# Patient Record
Sex: Female | Born: 1965
Health system: Southern US, Community
[De-identification: ages and names within clinical notes are randomized; demographics above are authoritative.]

## PROBLEM LIST (undated history)

## (undated) DIAGNOSIS — E785 Hyperlipidemia, unspecified: Secondary | ICD-10-CM

## (undated) DIAGNOSIS — K8020 Calculus of gallbladder without cholecystitis without obstruction: Secondary | ICD-10-CM

## (undated) DIAGNOSIS — I671 Cerebral aneurysm, nonruptured: Secondary | ICD-10-CM

## (undated) DIAGNOSIS — E669 Obesity, unspecified: Secondary | ICD-10-CM

## (undated) DIAGNOSIS — R55 Syncope and collapse: Secondary | ICD-10-CM

## (undated) DIAGNOSIS — R0602 Shortness of breath: Secondary | ICD-10-CM

## (undated) DIAGNOSIS — Z8679 Personal history of other diseases of the circulatory system: Secondary | ICD-10-CM

## (undated) DIAGNOSIS — F32A Depression, unspecified: Secondary | ICD-10-CM

## (undated) DIAGNOSIS — Z9289 Personal history of other medical treatment: Secondary | ICD-10-CM

## (undated) DIAGNOSIS — I252 Old myocardial infarction: Secondary | ICD-10-CM

## (undated) DIAGNOSIS — E7800 Pure hypercholesterolemia, unspecified: Secondary | ICD-10-CM

## (undated) DIAGNOSIS — R079 Chest pain, unspecified: Secondary | ICD-10-CM

## (undated) DIAGNOSIS — I2510 Atherosclerotic heart disease of native coronary artery without angina pectoris: Secondary | ICD-10-CM

## (undated) DIAGNOSIS — M549 Dorsalgia, unspecified: Secondary | ICD-10-CM

## (undated) DIAGNOSIS — Z87891 Personal history of nicotine dependence: Secondary | ICD-10-CM

## (undated) DIAGNOSIS — R011 Cardiac murmur, unspecified: Secondary | ICD-10-CM

## (undated) DIAGNOSIS — F419 Anxiety disorder, unspecified: Secondary | ICD-10-CM

## (undated) DIAGNOSIS — G500 Trigeminal neuralgia: Secondary | ICD-10-CM

## (undated) DIAGNOSIS — F329 Major depressive disorder, single episode, unspecified: Secondary | ICD-10-CM

## (undated) DIAGNOSIS — K219 Gastro-esophageal reflux disease without esophagitis: Secondary | ICD-10-CM

## (undated) HISTORY — DX: Shortness of breath: R06.02

## (undated) HISTORY — DX: Cardiac murmur, unspecified: R01.1

## (undated) HISTORY — PX: COLONOSCOPY: SHX174

## (undated) HISTORY — DX: Old myocardial infarction: I25.2

## (undated) HISTORY — DX: Personal history of other medical treatment: Z92.89

## (undated) HISTORY — DX: Dorsalgia, unspecified: M54.9

## (undated) HISTORY — DX: Obesity, unspecified: E66.9

## (undated) HISTORY — DX: Pure hypercholesterolemia, unspecified: E78.00

## (undated) HISTORY — DX: Personal history of nicotine dependence: Z87.891

## (undated) HISTORY — DX: Trigeminal neuralgia: G50.0

## (undated) HISTORY — DX: Hyperlipidemia, unspecified: E78.5

## (undated) HISTORY — PX: TUBAL LIGATION: SHX77

## (undated) HISTORY — DX: Chest pain, unspecified: R07.9

## (undated) HISTORY — DX: Atherosclerotic heart disease of native coronary artery without angina pectoris: I25.10

## (undated) HISTORY — PX: LASIK: SHX215

## (undated) HISTORY — DX: Cerebral aneurysm, nonruptured: I67.1

---

## 2000-02-02 ENCOUNTER — Emergency Department (HOSPITAL_COMMUNITY): Admission: EM | Admit: 2000-02-02 | Discharge: 2000-02-02 | Payer: Self-pay | Admitting: Internal Medicine

## 2000-04-03 HISTORY — PX: ABDOMINAL HYSTERECTOMY: SHX81

## 2000-11-15 ENCOUNTER — Other Ambulatory Visit: Admission: RE | Admit: 2000-11-15 | Discharge: 2000-11-15 | Payer: Self-pay | Admitting: Gynecology

## 2000-11-23 ENCOUNTER — Encounter: Payer: Self-pay | Admitting: Family Medicine

## 2000-11-23 ENCOUNTER — Ambulatory Visit (HOSPITAL_COMMUNITY): Admission: RE | Admit: 2000-11-23 | Discharge: 2000-11-23 | Payer: Self-pay | Admitting: Family Medicine

## 2001-02-15 ENCOUNTER — Ambulatory Visit (HOSPITAL_COMMUNITY): Admission: RE | Admit: 2001-02-15 | Discharge: 2001-02-15 | Payer: Self-pay | Admitting: Gynecology

## 2001-03-14 ENCOUNTER — Inpatient Hospital Stay (HOSPITAL_COMMUNITY): Admission: RE | Admit: 2001-03-14 | Discharge: 2001-03-16 | Payer: Self-pay | Admitting: Gynecology

## 2001-03-14 ENCOUNTER — Encounter (INDEPENDENT_AMBULATORY_CARE_PROVIDER_SITE_OTHER): Payer: Self-pay | Admitting: Specialist

## 2001-11-14 ENCOUNTER — Emergency Department (HOSPITAL_COMMUNITY): Admission: EM | Admit: 2001-11-14 | Discharge: 2001-11-14 | Payer: Self-pay | Admitting: Emergency Medicine

## 2001-11-14 ENCOUNTER — Encounter: Payer: Self-pay | Admitting: Emergency Medicine

## 2002-01-07 ENCOUNTER — Other Ambulatory Visit: Admission: RE | Admit: 2002-01-07 | Discharge: 2002-01-07 | Payer: Self-pay | Admitting: Gynecology

## 2002-07-23 ENCOUNTER — Encounter: Admission: RE | Admit: 2002-07-23 | Discharge: 2002-07-23 | Payer: Self-pay | Admitting: Gynecology

## 2002-07-23 ENCOUNTER — Encounter: Payer: Self-pay | Admitting: Gynecology

## 2003-03-25 ENCOUNTER — Other Ambulatory Visit: Admission: RE | Admit: 2003-03-25 | Discharge: 2003-03-25 | Payer: Self-pay | Admitting: Gynecology

## 2003-08-11 ENCOUNTER — Encounter: Admission: RE | Admit: 2003-08-11 | Discharge: 2003-08-11 | Payer: Self-pay | Admitting: Gynecology

## 2004-04-03 DIAGNOSIS — I252 Old myocardial infarction: Secondary | ICD-10-CM

## 2004-04-03 HISTORY — DX: Old myocardial infarction: I25.2

## 2004-04-03 HISTORY — PX: CARDIAC SURGERY: SHX584

## 2004-05-17 ENCOUNTER — Inpatient Hospital Stay (HOSPITAL_COMMUNITY): Admission: AD | Admit: 2004-05-17 | Discharge: 2004-05-24 | Payer: Self-pay | Admitting: Emergency Medicine

## 2004-05-18 HISTORY — PX: CARDIAC CATHETERIZATION: SHX172

## 2004-05-21 ENCOUNTER — Encounter: Payer: Self-pay | Admitting: Cardiovascular Disease

## 2004-06-13 ENCOUNTER — Other Ambulatory Visit: Admission: RE | Admit: 2004-06-13 | Discharge: 2004-06-13 | Payer: Self-pay | Admitting: Gynecology

## 2004-06-16 ENCOUNTER — Ambulatory Visit: Payer: Self-pay | Admitting: Cardiology

## 2004-08-17 ENCOUNTER — Ambulatory Visit: Payer: Self-pay | Admitting: Cardiology

## 2004-09-08 HISTORY — PX: CARDIAC CATHETERIZATION: SHX172

## 2004-09-26 ENCOUNTER — Encounter (HOSPITAL_COMMUNITY): Admission: RE | Admit: 2004-09-26 | Discharge: 2004-12-19 | Payer: Self-pay | Admitting: Cardiovascular Disease

## 2004-11-18 ENCOUNTER — Ambulatory Visit: Payer: Self-pay | Admitting: Internal Medicine

## 2004-11-24 ENCOUNTER — Encounter: Admission: RE | Admit: 2004-11-24 | Discharge: 2004-11-24 | Payer: Self-pay | Admitting: Gynecology

## 2005-02-16 ENCOUNTER — Ambulatory Visit: Payer: Self-pay | Admitting: Internal Medicine

## 2005-03-21 ENCOUNTER — Ambulatory Visit (HOSPITAL_COMMUNITY): Admission: RE | Admit: 2005-03-21 | Discharge: 2005-03-22 | Payer: Self-pay | Admitting: Cardiovascular Disease

## 2005-03-21 HISTORY — PX: CARDIAC CATHETERIZATION: SHX172

## 2005-05-09 ENCOUNTER — Ambulatory Visit: Payer: Self-pay | Admitting: Cardiology

## 2005-05-22 ENCOUNTER — Encounter: Payer: Self-pay | Admitting: Emergency Medicine

## 2005-05-23 ENCOUNTER — Inpatient Hospital Stay (HOSPITAL_COMMUNITY): Admission: AD | Admit: 2005-05-23 | Discharge: 2005-05-24 | Payer: Self-pay | Admitting: *Deleted

## 2005-05-23 HISTORY — PX: CARDIAC CATHETERIZATION: SHX172

## 2005-08-09 ENCOUNTER — Ambulatory Visit: Payer: Self-pay | Admitting: Cardiology

## 2005-09-06 ENCOUNTER — Encounter (HOSPITAL_COMMUNITY): Admission: RE | Admit: 2005-09-06 | Discharge: 2005-12-05 | Payer: Self-pay | Admitting: Cardiovascular Disease

## 2005-09-19 ENCOUNTER — Ambulatory Visit (HOSPITAL_COMMUNITY): Admission: RE | Admit: 2005-09-19 | Discharge: 2005-09-19 | Payer: Self-pay | Admitting: Urology

## 2005-11-27 ENCOUNTER — Encounter: Admission: RE | Admit: 2005-11-27 | Discharge: 2005-11-27 | Payer: Self-pay | Admitting: Gynecology

## 2006-03-18 ENCOUNTER — Inpatient Hospital Stay (HOSPITAL_COMMUNITY): Admission: AD | Admit: 2006-03-18 | Discharge: 2006-03-19 | Payer: Self-pay | Admitting: *Deleted

## 2006-09-24 ENCOUNTER — Other Ambulatory Visit: Admission: RE | Admit: 2006-09-24 | Discharge: 2006-09-24 | Payer: Self-pay | Admitting: Gynecology

## 2006-12-18 ENCOUNTER — Encounter: Admission: RE | Admit: 2006-12-18 | Discharge: 2006-12-18 | Payer: Self-pay | Admitting: Gynecology

## 2008-01-13 ENCOUNTER — Encounter: Admission: RE | Admit: 2008-01-13 | Discharge: 2008-01-13 | Payer: Self-pay | Admitting: Gynecology

## 2008-07-15 ENCOUNTER — Ambulatory Visit: Payer: Self-pay

## 2008-08-04 ENCOUNTER — Ambulatory Visit (HOSPITAL_COMMUNITY): Admission: RE | Admit: 2008-08-04 | Discharge: 2008-08-04 | Payer: Self-pay | Admitting: Cardiovascular Disease

## 2008-08-04 ENCOUNTER — Encounter (INDEPENDENT_AMBULATORY_CARE_PROVIDER_SITE_OTHER): Payer: Self-pay | Admitting: Cardiovascular Disease

## 2008-10-30 ENCOUNTER — Emergency Department (HOSPITAL_COMMUNITY): Admission: EM | Admit: 2008-10-30 | Discharge: 2008-10-31 | Payer: Self-pay | Admitting: Emergency Medicine

## 2008-10-31 ENCOUNTER — Inpatient Hospital Stay (HOSPITAL_COMMUNITY): Admission: EM | Admit: 2008-10-31 | Discharge: 2008-11-03 | Payer: Self-pay | Admitting: Cardiovascular Disease

## 2008-11-02 HISTORY — PX: CARDIAC CATHETERIZATION: SHX172

## 2009-05-13 ENCOUNTER — Encounter: Admission: RE | Admit: 2009-05-13 | Discharge: 2009-05-13 | Payer: Self-pay | Admitting: Gynecology

## 2010-07-09 LAB — CBC
HCT: 33 % — ABNORMAL LOW (ref 36.0–46.0)
HCT: 34.6 % — ABNORMAL LOW (ref 36.0–46.0)
Hemoglobin: 11.8 g/dL — ABNORMAL LOW (ref 12.0–15.0)
MCHC: 34.1 g/dL (ref 30.0–36.0)
MCV: 92.6 fL (ref 78.0–100.0)
Platelets: 195 10*3/uL (ref 150–400)
RBC: 3.73 MIL/uL — ABNORMAL LOW (ref 3.87–5.11)
RBC: 3.86 MIL/uL — ABNORMAL LOW (ref 3.87–5.11)
RDW: 12.9 % (ref 11.5–15.5)
WBC: 3.8 10*3/uL — ABNORMAL LOW (ref 4.0–10.5)
WBC: 4.6 10*3/uL (ref 4.0–10.5)

## 2010-07-09 LAB — CARDIAC PANEL(CRET KIN+CKTOT+MB+TROPI)
CK, MB: 1 ng/mL (ref 0.3–4.0)
CK, MB: 1.4 ng/mL (ref 0.3–4.0)
Relative Index: INVALID (ref 0.0–2.5)
Relative Index: INVALID (ref 0.0–2.5)
Total CK: 62 U/L (ref 7–177)
Total CK: 66 U/L (ref 7–177)
Total CK: 70 U/L (ref 7–177)
Total CK: 72 U/L (ref 7–177)
Troponin I: 0.01 ng/mL (ref 0.00–0.06)
Troponin I: 0.02 ng/mL (ref 0.00–0.06)

## 2010-07-09 LAB — BASIC METABOLIC PANEL
BUN: 11 mg/dL (ref 6–23)
BUN: 9 mg/dL (ref 6–23)
CO2: 27 mEq/L (ref 19–32)
Calcium: 8.6 mg/dL (ref 8.4–10.5)
Creatinine, Ser: 1.01 mg/dL (ref 0.4–1.2)
GFR calc Af Amer: >60 mL/min (ref >60)
GFR calc non Af Amer: 59 mL/min — ABNORMAL LOW (ref >60)
GFR calc non Af Amer: >60 mL/min (ref >60)
Glucose, Bld: 95 mg/dL (ref 70–99)
Glucose, Bld: 97 mg/dL (ref 70–99)
Potassium: 3.8 mEq/L (ref 3.5–5.1)
Potassium: 4.2 mEq/L (ref 3.5–5.1)

## 2010-07-09 LAB — PROTIME-INR: Prothrombin Time: 14.4 seconds (ref 11.6–15.2)

## 2010-07-09 LAB — LIPID PANEL
Cholesterol: 99 mg/dL (ref 0–200)
HDL: 29 mg/dL — ABNORMAL LOW (ref >39)
LDL Cholesterol: 47 mg/dL (ref 0–99)
Triglycerides: 116 mg/dL (ref <150)

## 2010-07-10 LAB — DIFFERENTIAL
Basophils Absolute: 0 10*3/uL (ref 0.0–0.1)
Basophils Relative: 0 % (ref 0–1)
Lymphocytes Relative: 24 % (ref 12–46)
Monocytes Absolute: 0.5 10*3/uL (ref 0.1–1.0)
Monocytes Relative: 11 % (ref 3–12)
Neutro Abs: 3 10*3/uL (ref 1.7–7.7)
Neutrophils Relative %: 62 % (ref 43–77)

## 2010-07-10 LAB — POCT CARDIAC MARKERS
CKMB, poc: <1 ng/mL — ABNORMAL LOW (ref 1.0–8.0)
Myoglobin, poc: 65.8 ng/mL (ref 12–200)
Troponin i, poc: <0.05 ng/mL (ref 0.00–0.09)

## 2010-07-10 LAB — URINALYSIS, ROUTINE W REFLEX MICROSCOPIC
Bilirubin Urine: NEGATIVE
Glucose, UA: NEGATIVE mg/dL
Hgb urine dipstick: NEGATIVE
Ketones, ur: NEGATIVE mg/dL
Protein, ur: NEGATIVE mg/dL
Specific Gravity, Urine: 1.01 (ref 1.005–1.030)
pH: 7 (ref 5.0–8.0)

## 2010-07-10 LAB — BASIC METABOLIC PANEL
CO2: 26 mEq/L (ref 19–32)
Calcium: 9.1 mg/dL (ref 8.4–10.5)
Creatinine, Ser: 0.82 mg/dL (ref 0.4–1.2)
GFR calc Af Amer: >60 mL/min (ref >60)
GFR calc non Af Amer: >60 mL/min (ref >60)
Sodium: 142 mEq/L (ref 135–145)

## 2010-07-10 LAB — CARDIAC PANEL(CRET KIN+CKTOT+MB+TROPI)
CK, MB: 0.8 ng/mL (ref 0.3–4.0)
CK, MB: 1.1 ng/mL (ref 0.3–4.0)
Relative Index: 0.9 (ref 0.0–2.5)
Relative Index: INVALID (ref 0.0–2.5)
Total CK: 72 U/L (ref 7–177)
Troponin I: 0.01 ng/mL (ref 0.00–0.06)

## 2010-07-10 LAB — CBC
Hemoglobin: 12.2 g/dL (ref 12.0–15.0)
Hemoglobin: 11.4 g/dL — ABNORMAL LOW (ref 12.0–15.0)
MCHC: 35 g/dL (ref 30.0–36.0)
MCHC: 34.1 g/dL (ref 30.0–36.0)
MCV: 90.7 fL (ref 78.0–100.0)
RBC: 3.85 MIL/uL — ABNORMAL LOW (ref 3.87–5.11)
RBC: 3.68 MIL/uL — ABNORMAL LOW (ref 3.87–5.11)
RDW: 12.8 % (ref 11.5–15.5)
WBC: 4.8 10*3/uL (ref 4.0–10.5)

## 2010-07-10 LAB — CK TOTAL AND CKMB (NOT AT ARMC): Total CK: 108 U/L (ref 7–177)

## 2010-07-10 LAB — COMPREHENSIVE METABOLIC PANEL
CO2: 27 mEq/L (ref 19–32)
Calcium: 8.5 mg/dL (ref 8.4–10.5)
Creatinine, Ser: 0.77 mg/dL (ref 0.4–1.2)
GFR calc non Af Amer: >60 mL/min (ref >60)
Glucose, Bld: 95 mg/dL (ref 70–99)
Total Protein: 6.3 g/dL (ref 6.0–8.3)

## 2010-07-10 LAB — APTT: aPTT: 29 seconds (ref 24–37)

## 2010-07-10 LAB — TSH: TSH: 3.35 u[IU]/mL (ref 0.350–4.500)

## 2010-07-10 LAB — PROTIME-INR
INR: 0.9 (ref 0.00–1.49)
Prothrombin Time: 12.7 seconds (ref 11.6–15.2)

## 2010-07-10 LAB — TROPONIN I: Troponin I: 0.02 ng/mL (ref 0.00–0.06)

## 2010-08-16 NOTE — H&P (Signed)
Colleen Dillon, Colleen Dillon               ACCOUNT NO.:  [id number]   MEDICAL RECORD NO.:  [id number]          PATIENT TYPE:  INP   LOCATION:  3731                         FACILITY:  [facility]   PHYSICIAN:  Richard A. Alanda Amass, M.D.DATE OF BIRTH:  [birth year]   DATE OF ADMISSION:  10/31/2008  DATE OF DISCHARGE:                              HISTORY & PHYSICAL   CHIEF COMPLAINT:  Chest pain.   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  She is seen by Dr. Alanda Amass.  She has  a history of coronary disease.  She had an aborted BMI May 17, 2004, treated with thrombectomy and tandem bare-metal stenting.  She was  restudied in December 2006 for unstable angina and had a 90% in-stent  restenosis.  She was treated with cutting balloon and drug-eluting Taxus  stent placement.  She was studied again in February 2007, and her sites  were patent.  There is no significant circumflex OM or LAD disease.  She  has had recurrent chest pain recently and saw Dr. Alanda Amass.  She was  set up for a Myoview as an outpatient which was negative for ischemia in  May.  She was to see Dr. Alanda Amass in follow-up.  She had been having  intermittent chest pain for which she takes nitroglycerin for.  She  described a midsternal pressure.  Nitroglycerin seems to help.  The day  of admission, she was moving a couch at her daughter's house when she  had midsternal chest discomfort described as someone placing a foot on  her chest.  She became short of breath and some somewhat sweaty with  this.  She went home and rested, and her husband took her to the ER at  [facility].  Her EKG showed no acute changes, and her enzymes were  negative.  She was put on Lovenox and aspirin and transferred from [facility] for further evaluation.  She is pain free  currently.  She said she had symptoms for about two to two and half  hours.   Her past medical  history is remarkable for treated dyslipidemia.  She  has had previous hysterectomy and oophorectomy.   CURRENT MEDICATIONS:  1. Niaspan, unknown dose.  2. Plavix 75 mg a day.  3. Aspirin 325 mg a day.  4. Metoprolol 12.5 mg a day.  5. Prilosec OTC daily.  6. Zocor 20 mg a day.   She is allergic to PENICILLIN.   SOCIAL HISTORY:  She is married.  She quit smoking 25 years ago.  She  works as an Scientist, forensic at [employer].  She has two children and three  grandchildren.  One of her daughters and grandchild lives with her.   FAMILY HISTORY:  Remarkable that her mother had a history of aortic  dissection but did not require surgery.  Otherwise, there is no history  of early coronary disease.   REVIEW OF SYSTEMS:  Essentially unremarkable except for above.  She  denies any GI bleeding, melena.  There is  no history of diabetes or  hypertension.   PHYSICAL EXAMINATION:  Blood pressure 110/60, pulse 70, respirations 16.  GENERAL:  She is a well-developed, well-nourished female in no acute  distress.  HEENT:  Normocephalic, atraumatic.  Extraocular movements are intact.  Sclerae nonicteric.  Lids and conjunctivae within normal limits.  NECK:  Without JVD or bruit.  CHEST:  Clear to auscultation percussion.  CARDIAC:  Reveals regular rate and rhythm without murmur or gallop.  Normal S1-S2.  ABDOMEN:  Is nontender.  No hepatosplenomegaly.  EXTREMITIES:  Without edema.  Distal pulses are intact.  There are no  femoral bruits noted.  NEUROLOGIC EXAM:  Grossly intact.  She is awake, alert and oriented,  cooperative.  Moves all extremities without obvious deficit.   EKG shows sinus rhythm without acute changes.   LABORATORY DATA:  White count 4.8, hemoglobin 4.2, hematocrit 34.8,  platelets 233, INR 0.9.  Sodium 142, potassium 3.1, BUN 9, creatinine  0.8.  CK-MB and troponin are negative x2.  Chest x-ray shows mild  bronchitic changes.  No acute changes.   IMPRESSION:  1. Unstable  angina.  2. Known coronary disease with aborted DMI February 2006, treated with      a bare-metal stenting with early stent stenosis December 2006      treated with Taxus stenting, last catheterization February 2007      showing no significant in-stent restenosis and a Myoview in the May      2010 showing no ischemia.  Normal LV function.  3. Untreated dyslipidemia.   PLAN:  The patient is admitted to telemetry.  She has been started on  Lovenox.  Will add low-dose nitrate, continue her home medications.  She  will probably need restudy.  She will be seen by Dr. Lynnea Ferrier this  morning.      Abelino Derrick, P.A.      Richard A. Alanda Amass, M.D.  Electronically Signed    LKK/MEDQ  D:  10/31/2008  T:  10/31/2008  Job:  [id number]   cc:   Gerlene Burdock A. Alanda Amass, M.D.

## 2010-08-16 NOTE — Cardiovascular Report (Signed)
Colleen Dillon, Colleen Dillon               ACCOUNT NO.:  [id number]   MEDICAL RECORD NO.:  [id number]          PATIENT TYPE:  INP   LOCATION:  2508                         FACILITY:  [facility]   PHYSICIAN:  Richard A. Alanda Amass, M.D.DATE OF BIRTH:  03-19-1966   DATE OF PROCEDURE:  11/02/2008  DATE OF DISCHARGE:                            CARDIAC CATHETERIZATION   PROCEDURES:  Retrograde central aortic catheterization, selective  coronary angiography via Judkins technique, pre and post IC  nitroglycerin administration, LV angiogram in RAO and LAO projection,  abdominal aortic angiogram, midstream PA projection, hand injection,  IVUS interrogation right coronary artery, intravenous heparin infusion  and extra Plavix 150 mg p.o., fractional flow reserve (FFR) measurement  right coronary artery.   BRIEF HISTORY:  Colleen Dillon is a 45 year old white married mother of  two with three GC who has complex history of right coronary artery  disease.  She still works full-time at [employer].  Stopped  smoking 20 years ago, has a history of exogenous obesity, labile  hypertension, and hyperlipidemia.  She suffered an acute DMI on March 21, 2005 and that had late reperfusion with 90% occlusion of the right  coronary artery prior to the acute margin with positive enzymes and  diffuse eccentric thrombus throughout the distal RCA on emergency  catheterization, May 18, 2004, for acute coronary syndrome with ST  elevation MI, subtotally occluded.  She had extensive thrombectomy with  AngioJet and aspiration thrombectomy with dye for catheter along with  non-DES large stainless steel stenting with a 4.5/5.0 Liberte stents  through the RCA up to the acute margin.  She had PDA occlusion, which  was dilated with a 2.5 balloon.  She had initially successful procedure;  however, she developed recurrent pain, several hours later was brought  back the laboratory by Dr. Tresa Endo and had recurrent  thrombotic subtotal  occlusion of the distal RCA and PLA.  This was stented with overlapping  DES stents by Dr. Tresa Endo.  The PDA remained occluded, but there were good  collaterals from the left coronary artery system to one of the PDA  branches.  Clinically, she got over this quite nicely; however, because  of recurrent chest pain, she underwent catheterization on March 21, 2005 and had progression of disease in the acute margin of the dominant  RCA in between the previously stented areas of 80-85%.  This was treated  with cutting balloon atherectomy and 3.5/32 DES TAXUS stent postdilated  to 4 mm high pressure successfully.  She did well following this, but  had recurrent chest pain in 2007, prompting catheterization by Dr.  Jenne Campus on May 23, 2005 showing widely patent stents with good left-  to-right collaterals to the PDA.  She has had past negative Cardiolite  in 2007 and again 5/10 for ischemia, which did show scar.  She has had  intermittent chest pain as an outpatient.  She recently hospitalized on  October 31, 2008 with prolonged episode of substernal chest discomfort  while moving from the chair.  Acute myocardial infarction was ruled out  with normal EKG and no enzyme elevations  on multiple samples and she was  on Lovenox and medical therapy without recurrent chest pain.  She was  referred now for recatheterization.  Informed consent was obtained to  proceed.  She has had normal left coronary artery system in the past  with EF of approximately 50%.   The patient was brought to the Second Floor CP lab in the postabsorptive  state.  After 5 mg of Valium p.o. premedication, she was given a total 4  mg of Versed in divided doses for sedation and 25 mg of fentanyl for  back discomfort.  Right groin was prepped, draped in usual manner.  Xylocaine 1% was used for local anesthesia and a CRFA was entered with  single anterior puncture using 18 thin-wall needle and 6-French short   sidearm sheath was inserted without difficulty.  Diagnostic coronary  angiography was done with 6-French 4-cm taper Cordis preformed coronary  and pigtail catheters.  She was given 200 mcg of IC nitroglycerin in the  left coronary and 200 mcg of IC nitroglycerin in the right coronary with  repeat injections obtained.  LV angiogram was done in the RAO and LAO  projection at 25 mL, 14 mL per second and 20 mL, 12 mL per second  respectively.  Pullback pressure of the CA was performed and showed no  gradient across the aortic valve.  The abdominal angiogram was done in  the midstream PA projection by hand injection and showed dual widely  patent renal arteries bilaterally with normal flow.  Catheters were  removed.  Sidearm sheath was flushed.  Cine angiograms were reviewed  with my colleague, Dr. Tresa Endo.  We felt that there might be borderline  ISR of the multiple overlapping RCA stents and it was felt best to  proceed with IVUS interrogation of this.  The patient was given 3000  units of heparin and another 1000 later in the procedure.  ACTs were  therapeutic and a 150 mg of Plavix p.o. additional to her daily dose.  The right coronary was intubated with JR-4 6-French guiding catheter and  the lesion was crossed with a 0.014-inch Asahi soft guidewire.  [employer] iCross 40 MHz 3.6-French IVUS catheter was positioned  across the distal RCA and automated pullback was performed.  This  demonstrated nonsignificant right coronary artery stenosis (see below),  but this was borderline, so it was felt best to proceed with fractional  flow reserve measurement in this case.  The guidewire and IVUS were  removed.  The lesion was re-crossed with a 0.014-inch Volcano PrimeWire  - pressure flow wire.  This was equalized at the ostia.  The patient was  given 18 mcg of adenosine IV and then a second measurement was made  after 24 mcg of IV adenosine were administered to the right coronary   artery.  Fractional flow reserve was essentially normal with initial  measurement of 1.0 and second measurement 0.94.  The wire was then  removed.  The guiding catheter removed.  Sidearm sheath was flushed and  secured to the skin and the patient was taken to the holding area.  Since she had received Lovenox just several hours prior to the  procedure, we will probably have to wait another couple of hours before  removing her sidearm sheath and applying pressure for hemostasis to the  right groin.  The patient did tolerate the procedure well and was  transferred to the post-PCI unit for monitoring after the holding area.   Pressures:  LV:  100/0; LVEDP 16 mmHg.  CA:  100/66 mmHg.   There was no gradient between LV and CA on catheter pullback.   LV angiogram in the RAO projection revealed normal inferior wall  contraction.  There was hypo-akinesis from the proximal third down to  the apex of the inferior wall.  There was scar and some preserved wall  motion and hypo-akinesis of the apex.  In the LAO projection, there was  hypo-akinesis of the distal third of the posterior wall down to the  apex.  Estimated EF was greater than 50% with no mitral regurgitation  present.   Underlying fluoroscopy essentially showed multiple overlapping right  coronary artery stents from the midportion across the PLA branch.   The main left coronary artery was normal and large.   The LAD was of moderate size coursed to the apex of the heart.  There  was approximately 30% smooth narrowing at a bend in the junction of the  mid and distal third of the RCA, but no significant stenosis or systolic  compression with normal flow.  There was a large trifurcating DX1 before  SP1 and this was larger than the LAD and was normal.  The circumflex was  comprised with a large marginal branch and a small AV groove circumflex  branch, which were normal.   The right coronary was a large dominant vessel.  Extensive  stenting was  seen angiographically from the midportion crossed the PLA branch from  previously placed overlapping stents.   Proximal to the acute margin, there were two areas of eccentric 30% and  concentric less than 50% narrowing with excellent residual lumen and  some mild hypolucency.  There was another area beyond the acute margin  of less than 50% narrowing within the DES stent.  At the origin of the  PLA, there was approximately 50-60% narrowing angiographically, but good  flow.  There were several PDA branches; one was occluded and there was a  70% lesion of another one that is unchanged arising within the distal  stent.  There were excellent collaterals, grade 3, from the LAD to the  distal PDA which was unchanged from prior angiograms.   IVUS interrogation of the stent showed good stent expansion.  There was  excellent residual lumen throughout the stented area in the mid RCA  stent extending across the acute margin.  There was less than 50%  narrowing in the proximal portion of the DES stent beyond the acute  margin and the area of concerned at the origin of the PLA had a diameter  of 2.43 x 2.86 mm with eccentric narrowing, but good residual lumen and  a CSA of 5.6 meter squared.   As mentioned FFR across this area was normal at 0.92 and 1.0212  determinations.   DISCUSSION:  The patient has one episode of chest pain prompting her  admission.  She has had negative Cardiolite for ischemia, 5/10, and she  has had recurrent chest pain in the past on medical therapy, but  generally has been doing well.  There was no evidence of hemodynamically  significant ISR of her extensively stented RCA and she has IVUS wise and  angiographically approximately 50% or less areas of ISR in the  previously placed stents before the occluded PDA and at the PLA origin.  I would recommend continued medical therapy of this at this time.  Aggressive lipid lowering would also be indicated  long-term.  We will  see the patient  back in clinical followup.   CATHETERIZATION DIAGNOSES:  1. Chest pain, etiology not determined.  2. Inferior and posterior apical wall motion abnormality from prior      ACS with acute ST elevation myocardial infarction on May 18, 2004.  3. Non-DES overlapping stents, mid right coronary artery to acute      margin, May 18, 2004 with associated rheolytic thrombectomy      with AngioJet and aspiration thrombectomy for large thrombus burden      with occluded posterior descending artery with collaterals.  4. Recurrent ischemia with overlapping DES stents distal right      coronary artery, posterolateral artery, May 18, 2004.  5. DES stenting between two previously placed stents, March 21, 2005.  6. No restenosis, February 2007, on recatheterization.  7. Negative Cardiolite for ischemia, 5/10.  8. Recatheterization today, November 02, 2008 with IVUS interrogation and      fractional flow reserve demonstrating no significant in-stent      restenosis, angiographically or by this methods with 50% ISR at      posterolateral artery origin within the stent, 50% distal right      coronary artery, 30-50% areas in mid-to-distal right coronary      artery with good residual lumen in flow and good stent expansion      and large dominant right coronary artery.  9. Old occluded posterior descending artery midportion with      collaterals from the left coronary artery.  10.Gastroesophageal reflux disease.  11.Exogenous obesity.  12.Remote smoker.  13.Hyperlipidemia.  14.Past total abdominal hysterectomy and bilateral oophorectomy, Dr.      Nicholas Lose, 2002.      Richard A. Alanda Amass, M.D.  Electronically Signed     Richard A. Alanda Amass, M.D.  Electronically Signed    RAW/MEDQ  D:  11/02/2008  T:  11/03/2008  Job:  [id number]   cc:   [facility]  Scott A. Gerda Diss, MD  Gretta Cool, M.D.

## 2010-08-16 NOTE — Discharge Summary (Signed)
NAMEJULEY, GIOVANETTI               ACCOUNT NO.:  [id number]   MEDICAL RECORD NO.:  [id number]          PATIENT TYPE:  INP   LOCATION:  2508                         FACILITY:  [facility]   PHYSICIAN:  Richard A. Alanda Amass, M.D.DATE OF BIRTH:  06-Feb-1966   DATE OF ADMISSION:  10/31/2008  DATE OF DISCHARGE:  11/03/2008                               DISCHARGE SUMMARY   DISCHARGE DIAGNOSES:  1. Chest pain, worrisome for unstable angina, catheterization this      admission with intracoronary vascular ultrasound showing moderate      in-stent restenosis, to be treated medically.  2. Treated dyslipidemia.  3. Known coronary artery disease with aborted DMI in February 2006,      treated with bare metal stenting with early in-stent restenosis in      December 2006, treated with Taxus stenting.   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  She has a history of coronary artery disease  with an aborted DMI in February 2006, treated with a bare-metal stent  that restenosed in December 2006.  She had a Taxus stent placed at that  time.  She was studied in 2007 and her sites were patent.  She had a  negative Myoview in May 2010.  She was admitted to the emergency room  over the weekend on October 31, 2008, with chest pain, worrisome for  unstable angina.  She was transferred from [facility].  She is on  Lovenox and nitrates.  Catheterization done on November 02, 2008, by Dr.  Alanda Amass revealed moderate in-stent restenosis in the RCA stents.  Under IVUS, it appeared that she had adequate flow.  Plan is for  continued aggressive medical therapy.  If she presents with recurrent  chest pain, then we may want to consider an intervention.   LABORATORY DATA:  White count 4.6, hemoglobin 11.3, hematocrit 33, and  platelets 195.  Sodium 138, potassium 4.2, BUN 11, and creatinine 0.96.  CK-MB and troponins were negative.  Cholesterol is 99, triglycerides  116,  HDL 29, and LDL 47.  EKG shows sinus rhythm without acute changes.  Chest x-ray was done in [facility] and these results are pending,  although the report from the [facility] ER showed no acute findings.   DISCHARGE MEDICATIONS:  1. Protonix 40 mg a day.  2. Zocor 40 mg a day.  3. Niaspan 500 mg at bedtime.  4. Imdur 15 mg b.i.d.  5. Plavix 75 mg a day.  6. Wellbutrin XR 300 mg as taken at home.  7. Metoprolol 12.5 b.i.d.  8. Aspirin 162 mg a day.  9. Nitroglycerin sublingual p.r.n.   DISPOSITION:  The patient is discharged in stable condition and will  follow up with Dr. Alanda Amass as an outpatient.      Abelino Derrick, P.A.      Richard A. Alanda Amass, M.D.  Electronically Signed    LKK/MEDQ  D:  11/03/2008  T:  11/04/2008  Job:  [id number]

## 2010-08-19 NOTE — Discharge Summary (Signed)
NAMEWONDER, DONAWAY               ACCOUNT NO.:  [id number]   MEDICAL RECORD NO.:  [id number]          PATIENT TYPE:  INP   LOCATION:  4740                         FACILITY:  [facility]   PHYSICIAN:  Richard A. Alanda Amass, M.D.DATE OF BIRTH:  [birth year]   DATE OF ADMISSION:  05/18/2004  DATE OF DISCHARGE:  05/24/2004                                 DISCHARGE SUMMARY   REFERRING PHYSICIAN:  Dr. Gerda Diss   CHIEF COMPLAINT:  1.  Chest pain.  2.  History of total abdominal hysterectomy.   DISCHARGE DIAGNOSES:  1.  Non-ST myocardial infarction with ventricular tachycardia.  2.  Occluded right coronary artery.  3.  Post procedure dissection and occlusion of the right coronary artery.  4.  Dyslipidemia.  5.  Placement on the TRITON study.   PROCEDURES:  1.  Cardiac catheterization, PCI, and stenting of the right coronary artery      May 18, 2004.  2.  Post procedure right coronary artery dissection and occlusion of the      right coronary artery.  Status post PCI AngioJet aspiration.  Placement      of two more DES CYPHER stents May 18, 2004.  3.  ReoPro infusion x24 hours and heparin x72 hours.   BRIEF HISTORY:  The patient is a 45 year old white female with no prior  medical history who presented with chest pain for about 45 minutes prior to  arriving in ER.  The patient said she had been getting chest pain episodes  for the last three months no more than three to five minutes and going away  itself.  She describes the chest pain as a pressure-like sensation over the  retrosternal area associated with shortness of breath.  The onset of pain is  usually at rest.  Patient did give a history of diaphoresis with no other  significant symptoms.  She was admitted to rule out MI and further treatment  as indicated.  Patient was taking a hormonal therapy and was on no other  medications.   ALLERGIES:  PENICILLIN causes rash and swelling.   PAST SURGICAL HISTORY:  Abdominal  hysterectomy and oophorectomy three years  ago.   SOCIAL HISTORY:  Patient lives with her husband and children.  She works as  a Oncologist at [employer].  She smoked cigarettes for about  two years.  She quit 23 years ago.  Alcohol:  None.  Drugs:  None.   FAMILY HISTORY:  Father died of lung cancer.  Patient's mother has a history  of hypertension.  No specific history of coronary artery disease, diabetes,  or CVA.   REVIEW OF SYSTEMS:  Negative.   For further history and physical, please see the dictated note.   HOSPITAL COURSE:  Patient was admitted and taken to the catheterization  laboratory the day of admission by Dr. Alanda Amass.  Here he found 40%  followed by a 90% stenosis of the distal RCA.  Patient subsequently had no  other findings.  Patient then underwent PCI and stenting of the distal RCA  in a complicated procedure with two bare metal  stents.  Patient tolerated  the procedure well and returned to ICU in satisfactory condition.  She  developed recurrent chest discomfort that evening.  She had minor EKG  changes and she was taken back to the catheterization laboratory by Dr. Daphene Jaeger.  At that time she was found to have 100% occlusion at the opening of  the first stent.  The patient had extensive thrombus load in the right  coronary artery.  This was AngioJetted to remove the clot and then patient  underwent PCI and stenting with two CYPHER stents by Dr. Daphene Jaeger.  Patient  tolerated procedure well.  Her enzymes post procedure were markedly  positive.  She had some ventricular irritability, some short runs of  ventricular tachycardia but this resolved.  She was transfused for anemia.  Her lipid profile was all normal except for an HDL of 33.  Her other  laboratories were normal.  She made slow, steady progress.  Her ecchymosis  improved.  She was kept on ReoPro for 24 hours and then heparin for another  72 hours.  She was subsequently transferred to  the floor on May 22, 2004.  She was mobilized and by May 24, 2004 it was Dr. Landry Dyke opinion  that the patient could be discharged home.  She was discharged home on the  following medications.   DISCHARGE MEDICATIONS:  1.  TRITON drug for stent.  2.  Aspirin 325 mg daily.  3.  Lopressor 50 mg half tablet b.i.d.  4.  Niacin 500 mg daily.  5.  Zocor 20 mg daily.  6.  Nitroglycerin sublingual p.r.n. q. 5 minutes p.r.n. .   DISCHARGE ACTIVITY:  Light to moderate.  No lifting over 10 pounds.  No  driving.  No strenuous activity until she is cleared by Dr. Alanda Amass.   LABORATORIES:  White count 7.8, hemoglobin 10.2, hematocrit 29, platelets  314,000.  Electrolytes are normal.  BUN is 7, creatinine 0.9.   Patient made slow, steady progress.  She was transferred to floor on  May 23, 2004.  In the a.m. of May 24, 2004 it was our opinion she  was ready for discharge.  She had had some low grade fever which was  associated with a cough and cultures pending on that.  If she has any  problems we will plan to treat her as an outpatient.  She is scheduled to  come back and see Dr. Pearletha Furl. Alanda Amass in approximately two weeks.  Contact Dr. Gerda Diss for follow-up in his office.   PLAN:  The patient did well and she was subsequently discharged home in the  afternoon of May 24, 2004.   DISCHARGE MEDICATIONS:  Again and:  1.  TRITON drug.  2.  Aspirin 325 mg daily to take with her Niacin.  3.  Lopressor 50 mg half tablet b.i.d.  4.  Niacin 500 mg h.s.  5.  Zocor 20 mg daily.  6.  Nitroglycerin 1/150 SL p.r.n.  7.  Protonix 40 mg x1 month.   CONDITION ON DISCHARGE:  Improving.      WDJ/MEDQ  D:  05/24/2004  T:  05/24/2004  Job:  [id number]   cc:   Donna Bernard, M.D.  [address]  [place]  [place] [address]  Fax: [phone]

## 2010-08-19 NOTE — Cardiovascular Report (Signed)
Colleen, Dillon               ACCOUNT NO.:  [id number]   MEDICAL RECORD NO.:  [id number]          Dillon TYPE:  INP   LOCATION:  2929                         FACILITY:  [facility]   PHYSICIAN:  Richard A. Alanda Amass, M.D.DATE OF BIRTH:  1965-10-04   DATE OF PROCEDURE:  05/18/2004  DATE OF DISCHARGE:                              CARDIAC CATHETERIZATION   PROCEDURE:  Retrograde central aortic catheterization, elective coronary  angiography via Judkins technique, intracoronary nitroglycerin  administration, LV angiogram, RAO/LAO projection, subselective LIMA, RIMA,  abdominal aortic angiogram, midstream PA projection, weight-adjusted  heparin, Triton study drug administration, continued Integrilin 2b3a  inhibitor infusion, aspiration thrombectomy with Diver CE catheter distal  RCA, PTCA high-grade mid (acute margin) RCA eccentric thrombotic occlusion  subtotal, X-Sizer thrombectomy distal RCA, tandem bare metal large vessel  Liberte Scimed 5.0/20 and 4.5/16 stent deployment mid-distal RCA with high-  pressure inflation, PDA side branch PTCA.   BRIEF HISTORY:  Colleen Dillon is a 45 year old married mother of two with one  grandchild who is a remote smoker.  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  She has no prior coronary  history.  She was said to have normal cholesterol.  She has been on chronic  HRT (hormonal replacement therapy) since 2002 after a transabdominal  hysterectomy and bilateral TO for endometriosis.  Over Colleen last three weeks,  Colleen Dillon has been having intermittent episodes of shortness of breath and  substernal chest pain at rest and exertion and bilateral arm discomfort.  At  approximately 6:00 p.m. on May 17, 2004, she began having prolonged  substernal chest pain radiating to Colleen neck and jaw and arms bilaterally.  She was seen at [facility] Emergency Room.  Initial EKG showed 1 mm inferior  ST elevation and ST  depression V1 and V2 compatible with MI.  She was  treated with heparin, IV nitroglycerin, and started on Integrilin 2b3a  inhibitor.  She became pain free with this regimen in Colleen emergency room,  and EKG essentially normalized.  She was admitted [facility], and during Colleen  evening had recurrent chest discomfort that was transient, associated with  NSVT, treated with Xylocaine.  She was transferred to [facility] in Colleen a.m. of  May 18, 2004.  She was seen by me at that time on transfer to Colleen CCU.  She was pain free, and EKG did not show any acute ST changes.  She was given  four baby aspirin at [facility] and another four baby aspirin in  Colleen CCU.  Xylocaine was discontinued, and she was continued on 2b3a  inhibitor pending urgent catheterization.  Informed consent was obtained  from Colleen Dillon and her husband to proceed with catheterization.  All risks  and alternative therapies were discussed with Colleen Dillon and her husband,  including but not limited to myocardial infarction, death, vascular  complications, tamponade, possible emergency surgery.  Colleen Dillon and her  husband elected to proceed with urgent catheterization.   Colleen Dillon was brought to Colleen second floor CP lab.  Colleen right groin  was  prepped, draped in Colleen usual manner.  One percent Xylocaine was used for  local anesthesia.  Colleen Dillon was given intermittent Versed totalling 6 mg  and 2 mg of Nubain for sedation during Colleen procedure.  Because of Colleen  Dillon's exogenous obesity and relatively low blood pressure of 110, a  Doppler flow-directed smart needle was used to access Colleen RCFA.  A Teflon-  coated J-tip guide wire was used to traverse Colleen iliac system, and guide  wire exchange was used throughout Colleen procedure.  A 6 French short side-arm  sheath was inserted without difficulty.  Diagnostic coronary angiography was  done with 6 French 4 cm taper Cordis preformed coronary and pigtail  catheters using Omnipaque  dye throughout Colleen procedure.  Colleen Dillon was  continued on IV nitroglycerin and 2b3a inhibitor.  IC nitroglycerin was  given into Colleen right coronary, with repeat injections obtained.  Subselective LIMA and RIMA were done with Colleen right coronary catheter,  demonstrating widely patent brachiocephalic, RIMA, left subclavian, and LIMA  vessels.  Colleen vertebrals were normal proximally with antegrade flow.  LV  angiogram was done in Colleen RAO and LAO projection at 25 cc, 14 cc/second, and  20 cc, 12 cc/second.  Pullback pressure to Colleen CA showed no gradient across  Colleen aortic valve.  Abdominal angiogram was done by hand injection in Colleen  midstream PA projection above Colleen level of Colleen renal arteries with a pigtail  catheter demonstrating normal proximal celiac and SMA access and normal  single renal arteries bilaterally.  Colleen immediate infrarenal abdominal aorta  appeared normal, and there was good runoff.   Colleen Dillon tolerated Colleen diagnostic procedure well, without chest pain or  EKG changes.   PRESSURES:  LV:  115/0; LVEDP 20 mmHg.  CA:  115/70 mmHg.  There was no gradient across Colleen aortic valve.   LV angiogram demonstrated hypo-akinesis of Colleen mid-inferior wall,  hypokinesis of Colleen basilar inferior wall, and hypo-akinesis of Colleen  posteroapical segment.  Estimated EF was well preserved and approximately  50% or greater.  There was no significant mitral regurgitation present.   Colleen main left coronary artery was normal.   Colleen left anterior descending artery was widely patent, smooth throughout its  course.  There was some mild systolic bridging in Colleen mid-LAD, but no  significant vessel compression or narrowing.  Colleen LAD coursed Colleen apex of  Colleen heart, where it bifurcated with normal flow.   There was a very large trifurcating first diagonal branch that arose from  Colleen proximal LAD that was normal, widely patent, large and smooth.  Colleen circumflex artery was comprised of a small  trifurcating first marginal  branch, a circumflex proper that was of moderate size, and a small PABG  branch.   Colleen right coronary was a large dominant vessel.  Colleen proximal third and  midportion were widely patent, smooth, and normal, and represented a large  dominant vessel.  There was approximately 40% segmental narrowing just  beyond Colleen midportion up to Colleen moderately large RV branch before Colleen acute  margin.  There was a 90% eccentric thrombotic stenosis of Colleen RCA involving  Colleen acute margin, beginning just beyond Colleen RV branch.  Colleen RCA distal to  this demonstrated lumpy bumpy narrowing.  There was no visible thrombus in  this, but we could not exclude laminar thrombus since there was a size  mismatch between Colleen rest of Colleen RCA and this vessel.  There was good  flow  to a moderate-size PDA and a large trifurcating PLA.  There was filling  defect and thrombus in Colleen proximal portion of Colleen PLA artery that was  fairly well localized.   Colleen Dillon had previously been consented to participate in Colleen Triton study  after all risks and study were explained to Colleen Dillon and her husband by  Colleen research nurse and myself.  This involves randomization between Plavix  and alternative antiplatelet therapy in addition to conventional angioplasty  therapy.  At Colleen time of Colleen study, there was no chest pain or acute ST  segment changes.   It was elected to proceed with PCI in this setting.  Colleen Dillon was given  weight-adjusted heparin of 4,500 units.  Later in Colleen procedure, she was  given another 1,000 units.  2b3a inhibitor with Integrilin was continued.  She was given oral study drug during Colleen PCI procedure.   Colleen right coronary was intubated with Colleen JR-4 6 [place] Cordis guiding  catheter.  Initially, Colleen lesion was crossed with a 0.014 short Asahi light  guide wire.  We were able to cross Colleen distal, cross Colleen distal RCA, and Colleen  guide wire was free in Colleen distal PLA branch.    Because of extensive thrombus in this area and possible thrombus beyond Colleen  lesion, an EV3 Diver aspiration thrombectomy catheter was advanced into Colleen  distal RCA to Colleen PLA and approximately five or six aspiration pullback runs  were done in Colleen right coronary artery.  This gave very minimal improvement.  Colleen lesion itself, using exchange technique, was then dilated at low  pressure with a 3.0/12 undersize guide and Voyager balloon at 5-20 and 5-20.   Angiography post IC nitroglycerin demonstrated continued thrombus.  Colleen  Dillon began having chest pain and transient no reflow phenomenon  throughout Colleen distal RCA and PLA with ST elevation.  She was treated with  intracoronary nitroglycerin and intracoronary adenosine, a total of 18 mcg  x2, and multiple doses of intracoronary nitroglycerin, monitoring blood  pressures.   Colleen balloon was then exchanged for a [employer] X-Sizer aspiration thrombectomy catheter.  This was positioned.  In order to use this, a new  wire had to be passed across Colleen lesion into Colleen PLA 300 cm Asahi light  wire.  Colleen previous wire was removed.  Colleen X-Sizer catheter was delivered,  and aspiration thrombectomy of approximately four runs were done from Colleen  PLA around Colleen acute margin to Colleen mid-RCA.  This showed significant  improvement in Colleen lumen of Colleen distal RCA, and angiographic resolution of  thrombus in Colleen PLA, with persistence of Colleen coronary lesion.  There was  some dye staining at Colleen coronary lesion suggesting that this was probably  related to ruptured plaque.   Colleen distal RCA was extremely large, as was Colleen remainder of Colleen RCA, and  there was significant improvement in Colleen lumen up to Colleen area just distal to  Colleen lesion.   Colleen lesion was then stented with a large-vessel [employer] Liberte  bare metal 5.0/20 mm stent, which was deployed to Colleen lesion and across Colleen  lesion in Colleen RV branch at 9-30.  It was post-dilated at 10 ATM  for 10  seconds.  There was a residual stenosis, so a second overlapping Liberte  4.5/16 stent was positioned fluoroscopically and deployed at 10-30 and post-  dilated with Colleen dilatation balloon at 15-30 on Colleen overlap (equivalent to 5  mm diameter).  Colleen balloon was removed.  Colleen lesion was covered completely,  and there was good flow to Colleen distal RCA.  However, Colleen PLA was occluded,  probably thrombotic occlusion, since initial angiograms did not show a  lesion at Colleen PLA origin.   Attempts to cross Colleen PDA with Colleen Asahi soft wire were accomplished, and  Colleen ostium of Colleen PDA was dilated with a 2.5/12 Voyager balloon.  This did  not result in good flow, and there was faint flow to Colleen PDA.  We were  unable to pass a wire any further, so this was abandoned, and Colleen wire was  pulled back.   Colleen Dillon was given several doses during Colleen stenting and interventional  procedure of intracoronary nitroprusside of 200 mcg x 3, which improved  distal diameter and flow.  At Colleen end of Colleen procedure, there was good TIMI  III flow into Colleen PLA and only faint flow into Colleen PDA branch.  Colleen lesion  was well covered, with Colleen stent well approximated, with no visible  thrombus.   There was a small linear dissection of Colleen distal RCA distal to Colleen stented  area that was not flow limiting superior.  It was elected not to treat this  area.  Colleen dilatation system was removed.  Colleen Dillon was given 5 mg of  Lopressor IV.  Blood pressure during ranged from 130-140 systolic, and Colleen  Dillon maintained sinus rhythm.  She was pain free at this time.  ST  segments were minimally elevated in Colleen inferior leads.  Side-arm sheath was  flushed. Colleen final ACT was 230 seconds, and Colleen Dillon was given another  1,000 units of intravenous heparin.  Colleen side-arm sheath was secured with a  #1 silk suture to prevent migration, and Colleen Dillon was transferred to Colleen  holding area for postoperative care.  Colleen  Dillon presented initially with 1 mm ST segment elevation on May 17, 2004 that normalized in Colleen ER, with therapy as outlined above.  She had  a subtotal greater than 90% eccentric thrombotic RCA lesion at Colleen acute  margin, with what turned out to be diffuse probably laminated thrombus and  associated spasm beyond Colleen lesion extending up into Colleen PLA.  Aspiration  thrombectomy was performed, as outlined above, along with tandem bare metal  stenting of Colleen large dominant RCA across Colleen lesion.   She had side branch PDA functional occlusion at Colleen end of Colleen procedure,  which hopefully will open up with continued anticoagulant therapy.  Also,  there is a small linear dissection beyond Colleen stented area that was not flow  limiting that will hopefully remain stable on therapy.   We plan to continue study drug from Colleen Triton study along with 2b3a  inhibitor.   Based on Colleen Dillon's clinical status, laboratory values, we will decide  about restudy to further assess her large dominant right coronary artery and  see if any further therapy is needed and also to see if she has reperfusion  of Colleen PDA branch.  Her enzymes were elevated to 400 from [facility], with positive MBs and troponin.  We expect that she will have  further enzyme release because of her transient no reflow phenomenon and ST  elevation from her thrombotic occlusion.   She will be treated with appropriate ancillary medical therapy and observed  in Colleen CCU.   CATHETERIZATION DIAGNOSES:  1.  Aborted acute DMI, May 17, 2004, with therapy as outlined above.  2.  High-grade thrombotic eccentric right coronary artery stenosis with      distal right coronary artery laminated extensive thrombus, treated with      percutaneous transluminal coronary angioplasty, aspiration thrombectomy,      tandem bare metal large-vessel stents, as outlined above.  3.  Well-preserved left ventricular function.  Ejection  fraction greater      than 50%.  Inferior and posteroapical wall motion abnormality.  4.  Lipid status pending.  Normal by history.  5.  History of abdominal hysterectomy and bilateral oophorectomy for      endometriosis (Dr. Nicholas Lose), 2002, and subsequent chronic hormone      replacement therapy.  6.  Exogenous obesity.  7.  Family history of vascular disease.  Mother with hypertension and      history of aortic dissection.  8.  Remote smoker.      RAW/MEDQ  D:  05/18/2004  T:  05/18/2004  Job:  [id number]   cc:   Gerlene Burdock A. Alanda Amass, M.D.  [phone] [address], [address]  [place]  [place] [address]  Fax: [phone]   CP Lab   Scott A. Gerda Diss, MD  [address], [address]  [place]  [place] [address]  Fax: [phone]   Catheterization Lab  Second Floor   Gretta Cool, M.D.  [address] [place]  [place] [address]  Fax: [phone]

## 2010-08-19 NOTE — Cardiovascular Report (Signed)
Colleen Dillon, Colleen Dillon               ACCOUNT NO.:  [id number]   MEDICAL RECORD NO.:  [id number]          PATIENT TYPE:  OIB   LOCATION:  6522                         FACILITY:  [facility]   PHYSICIAN:  Richard A. Alanda Amass, M.D.DATE OF BIRTH:  1966-03-30   DATE OF PROCEDURE:  03/21/2005  DATE OF DISCHARGE:                              CARDIAC CATHETERIZATION   PROCEDURE:  Retrograde central aortic catheterization, selective coronary  angiography via Judkins technique, LV angiogram RAO, LAO projection,  abdominal aortic angiogram midstream PA projection hand injection, weight-  adjusted heparin, continued Triton study drug (antiplatelet) IVUS  interrogation RCA, cutting balloon atherectomy followed by DES large vessel  3.5/32 Taxus stent mid-distal RCA post dilated with 4.0 balloon high-  pressure greater than 20 atmospheres, right common femoral artery closure  with 6-French AngioSeal device successful.   HISTORY:  The patient was brought to the second floor CP lab as an  outpatient and admitting the same day with normal preoperative laboratory,  BUN and creatinine ___________ and 1.1. CBC, differentials and coagulases  normal. TSH normal.   The right groin was prepped and draped in the usual manner. 1% Xylocaine was  used for local anesthesia and the CRFA was entered with single anterior  puncture using 18 thin-wall needle and a 6-French short Daig sidearm sheath  was inserted without difficulty. Diagnostic coronary angiography was done  with 6-French 4 cm taper preformed Cordis coronary and pigtail catheters  using nonionic dye. LV angiogram was done in the RAO and LAO projection 25  cc 14 cc per second, 20 cc 12 cc per second. Pullback pressure CA was  performed. It showed no gradient across the aortic valve. Abdominal aortic  angiogram was done in the midstream PA projection by hand injection  demonstrating double renal arteries bilaterally that were widely patent and  normal  infrarenal abdominal aorta. Catheters were removed. Side-arm sheath  was flushed. The patient tolerated the diagnostic procedure well. She was  given intermittent Versed during the diagnostic and PCI procedure, a total  of 5 milligrams IV, 25 mcg of fentanyl and 4000 units of intravenous  heparin. She was continued on the Triton antiplatelet study drug (blinded  Plavix versus Triton drug) aspirin as an outpatient.   PRESSURES:  LV: 110/0; LVEDP 18 mmHg.   CA: 110/60 mmHg.   LV angiogram showed hypo-akinesis of the basilar quarter of the inferior  wall, akinesis of the small area of the mid-inferior, hypo-akinesis of the  inferoapical segment with a mild paradoxical bulge. In the LAO projection  there was hypo-akinesis of the distal septal apical and posterior apical  segment. Overall EF was greater than 55% with no mitral regurgitation.   Fluoroscopy showed the previously placed extensive large vessel mid and  distal RCA stents. There was no significant left coronary calcification or  intracardiac calcification.   Main left coronary was normal.   The LAD was widely patent and smooth throughout its course. It coursed to  the apex of the heart where it bifurcated. It then gave off grade 3  collaterals to the previously occluded PDA from the distal LAD  which is  unchanged from prior angiography. The large first diagonal bifurcated twice,  arose before SP1 and was widely patent and normal. A moderate-sized second  diagonal arose from the mid-LAD. Multiple septal perforators were present.   There was a small normal optional diagonal.   The circumflex was nondominant, bifurcated distally, gave off a small  marginal branch proximally and a small PABG branch that were normal.   The right coronary artery was a large dominant vessel.   The previously placed overlapping mid-RCA bare-metal stents showed 40-50%  and 50-60% intimal proliferation in narrowing in the distal third.   The  distal overlapping DES Cypher stents beginning in the distal RCA  extending into the PLA showed an area of approximately 60% narrowing near  the PLA bifurcation and mild to moderate in-stent restenosis with good flow.   The intercurrent area between the stented area just beyond the acute margin  had concentric 80-85% narrowing. There was also some streaming of flow into  the post stenotic area. This appeared progressed from the patient prior  catheterization.   It was elected to proceed with IVUS interrogation because of concerns of  progression of disease in the stented area of distal RCA and in-stent  restenosis.   IVUS interrogation was done with this probe through the 6-French guiding  catheter over a 0.014 inch Asahi soft wire that was used to traverse the  lesion. It was free in the distal PLA. ACT was therapeutic after weight-  adjusted heparin prior to IVUS interrogation.   IVUS therapy to the nonstented was noted to have diffuse soft plaque in a  relatively concentric fashion with a lumen of 2.0 x 2.0 mm. There was also  diffuse in-stent restenosis within the distal portion of the bare-metal  stent with intimal proliferation and moderate restenosis of the midportion  of the overlapping DES stents in the distal RCA. It was elected to proceed  with PCI in this setting.   The patient was given double bolus Aggrastat plus infusion. ACTs were  monitored. A 4.0/10 Scimed cutting balloon was then used. Inflations within  the distal tandem DES stents in the distal RCA and to the PLA were done at 4-  30, 4-30 and 5-55. The balloon was pulled back to the area between these  stents beyond the acute margin and inflations were done at 6-42, 6-44 and 8-  48.   The balloon was then pulled back. Scout injections were obtained post IC  nitroglycerin administration. The area between the two stents and  overlapping of the stented area was then restented with a DES Scimed Taxus Express II,  3.5/32 mm long stent which was positioned fluoroscopically,  deployed at 18-34, post dilated at 20-31.   Following that, the cutting balloon was reintroduced and the distal area  near one of the PLA branches was redilated at 6-46.   The balloon was exchanged for 4.0-20 upgraded Scimed Quantum balloon. The  stented area and overlap was post dilated at 20-39, 20-32 and 20-38. The  balloon was removed and final injection showed excellent angiographic result  with TIMI III flow to the PLA branches. The distal in-stent restenosis was  reduced from 60% to less than 10% with side branches intact. The nonstented  area was reduced to 0%. The in-stent restenosis in the mid-RCA was reduced  to less than 10%. Two large RV branches were intact. There was some minor  irregularity and 20% narrowing in one portion of the distal tandem stents.  Significant angiographic improvement overall. No streaming of flow.   DISCUSSION:  Veta's history dates back to September 26, 2004 when she was  transferred from [facility] with stuttering ST elevation inferior  myocardial infarction. She was taken to the laboratory and had thrombotic  occlusion of the RCA with large thrombus burden and was treated with  aspiration thrombectomy and nonDES large vessel 5.0/20 and 4.5/16  overlapping mid Liberte Scimed stents. The PDA was treated with a 2.5  balloon. She was on the Triton study at that time. Later that evening, she  developed reocclusion of her RCA and was taken back to the laboratory and  had AngioJet thrombectomy and then tandem 3.5/23 at 3.0/18 Cypher stents  placed in the distal RCA across the PDA in to the PLA. The PDA was occluded  but there were collaterals from the LAD and this was not reopened. She did  well after that and was treated medically. Recatheterization September 08, 2004 as  an outpatient because of symptoms of angina showed 40% narrowing in the area  between the stents and some mild streaming and  in-stent restenosis that was  felt to be noncritical. She was treated medically. Following that, however,  she recently had recurrent chest pain compatible with angina prompting this  catheterization. She has also had exertional dyspnea that may have been an  anginal equivalent.   She is on medical therapy with beta blockers, aspirin, Triton study drug,  Zocor, Niaspan,  Protonix and Femring every three months. Previous to her  infarction, she had been on supplemental estrogen orally. 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 She has a 47 year old and a 47 year old.  She smoked only a short time over 20 years ago.   Ms. Bolinger has had good angiographic result. At this time, she will be  continued on Triton study drug. Unfortunately, she has significant exogenous  obesity and hopefully we can reinstitute good exercise program and weight reduction, get dietary consultation while she is in the hospital. She has  extensive stenting from the mid-RCA extending into the PLA and she will need  to be on surveillance for this for her dominant RCA. There fortunately is no  significant LAD nondominant circumflex disease. LV function despite  segmental wall motion abnormalities is well preserved.   CATHETERIZATION DIAGNOSES:  1.  Arteriosclerotic heart disease: Subendocardial myocardial infarction      treated with bare-metal stent to the right coronary artery February      2006. ST elevation myocardial infarction.  2.  Abrupt reocclusion requiring repeat percutaneous coronary intervention      February 2006 with tandem drug-eluting stenting, distal right coronary      artery to posterior lateral artery. Posterior descending artery occluded      but good grade 3 collaterals from left anterior descending artery      chronically.  3.  Minor wall motion abnormalities. Ejection fraction greater than 55%.  4.  Recurrent  angina with progression of disease in the nonstented area, mid      - distal right coronary artery treated with cutting balloon atherectomy      and drug-eluting large vessel stent as outlined above.  5.  Cutting balloon atherectomy distal right coronary artery, mild in-stent      restenosis and mid right coronary artery and in-stent restenosis.  6.  Exogenous obesity.  7.  Hyperlipidemia.  8.  Gastroesophageal reflux  disease.  9.  Hormonal replacement therapy.  10. Remote smoker.      Richard A. Alanda Amass, M.D.  Electronically Signed     RAW/MEDQ  D:  03/21/2005  T:  03/22/2005  Job:  [id number]   cc:   Patient's chart   Record room   CP Lab   Scott A. Gerda Diss, MD  Fax: [phone]   [facility] office

## 2010-08-19 NOTE — Op Note (Signed)
[facility]  Patient:    [name], [name] Visit Number: [id number] MRN: [id number]          Service Type: DSU Location: DAY Attending Physician:  Katrina Stack Dictated by:   Gretta Cool, M.D. Admit Date:  02/15/2001   CC:         Lilyan Punt, M.D.   Operative Report  PREOPERATIVE DIAGNOSES: 1. Complex left adnexal mass. 2. Cyclic right lower quadrant pain. 3. Elevated CA-125.  POSTOPERATIVE DIAGNOSES: 1. Stage III endometriosis with enormous left ovarian endometrioma. 2. 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  PROCEDURE:  Diagnostic laparoscopy.  BRIEF HISTORY:  A 45 year old gravida 2, para 2, under the primary care of Dr. Lilyan Punt, with onset of pelvic pain, right lower quadrant, approximately six months prior, with finding of a left complex cystic adnexal mass, progressively more complex, suspicious of adnexal pathology of adenocarcinoma of the ovary.  Now admitted for diagnostic laparoscopy, possible left oophorectomy.  She wishes to delay definitive therapy if possible until she has finished her course of study.  Now admitted for laparoscopy, possible salpingo-oophorectomy, possible laparotomy.  DESCRIPTION OF PROCEDURE:  Under excellent general anesthesia, orotracheal, with the patient prepped and draped in Allen stirrups, a subumbilical incision was made an extended through the fascia.  The Veress needle was then introduced and then the pneumoperitoneum obtained with carbon dioxide.  A laparoscope trocar was then introduced and pelvic organs visualized with the patient in lithotomy position.  Extensive pelvic endometriosis was immediately identified with evidence of previous rupture and leakage of endometrioma with staining of the omentum, pelvic surfaces, anterior and posterior uterus, both ovaries.  There was evidence of uteroperitoneal fistula formation with leakage of  endometrial tissue and blood from the short stump of the fallopian tube ligation by cautery done elsewhere.  There was evidence of endometriosis or at least brown staining on the diaphragm surface, on the omentum, and on bowel. At this point a decision was made to delay definitive therapy to more complete and effective therapy by total abdominal hysterectomy, bilateral salpingo-oophorectomy at a later time as per her wishes.  At this point the procedure was terminated without complication, gas allowed to escape, incision closed with deep suture of 5-0 Vicryl after evacuation of all the possible pneumoperitoneum.  The patient was then returned to the recovery room in excellent condition. Dictated by:   Gretta Cool, M.D. Attending Physician:  Katrina Stack DD:  02/15/01 TD:  02/15/01 Job: [id number]

## 2010-08-19 NOTE — Discharge Summary (Signed)
[facility]  Patient:    Colleen Dillon, Colleen Dillon Visit Number: [id number] MRN: [id number]          Service Type: Attending:  Gretta Cool, M.D. Dictated by:   Jeani Sow, R.N., F.N.P. Adm. Date:  03/14/01 Disc. Date: 03/16/01   CC:         Lilyan Punt, M.D.   Discharge Summary  HISTORY OF PRESENT ILLNESS:  Ms. Feldt is a 45 year old female, gravida 2, para 2, who has had evaluation by Dr. Lilyan Punt in [place], [place] for complaints of right lower quadrant pain that has persisted for six months.  She describes it as colicky in nature, rising to a peak, then easing, and is often induced by intercourse lasting several hours afterwards with each occasion.  Ultrasound evaluation revealed a complex left adnexal mass.  She subsequently had diagnostic laparoscopy which revealed stage 3 endometriosis involving both ovaries, tubes, as well as evidence of uteroperitoneal fistula from previous tubal sterilization close to the corneal area of the uterus.  She is now admitted for definitive therapy by lysis of adhesions, total abdominal hysterectomy, bilateral salpingo-oophorectomy, and excision of peritoneal endometriosis.  ADMISSION EXAM:  CHEST:  Clear to auscultation and percussion.  HEART:  Rate and rhythm were regular without murmur, gallop, or cardiac enlargement.  ABDOMEN:  Soft and scaphoid without masses or organomegaly.  Recent laparoscopy scar was noted.  Previous cesarean section scar is also noted.  PELVIC:  External genitalia within normal limits for female.  Vagina is clean and rugous with adequate support.  She has a well-supported cervix and uterus. Left adnexal mass is suffixed to the pelvic wall and tender to manipulation. The right adnexa is more difficult to palpate due to her guarding. Rectovaginal exam confirms.  IMPRESSION: 1. Stage 3 endometriosis with bilateral ovarian endometriomas and    laparoscopic evidence of  uteroperitoneal fistula and intermittent    rupture and leakage of the endometriosis. 2. History of disk disease.  PLAN:  Total abdominal hysterectomy, bilateral salpingo-oophorectomy, excision of adhesions and endometrial implants.  Risks and benefits have been discussed with the patient and she accepts these procedures.  LABORATORY DATA:  Admission hemoglobin 12.4, hematocrit 36.2, on the first postoperative day hemoglobin 10.7, hematocrit 31.4.  HOSPITAL COURSE:  The patient underwent total abdominal hysterectomy, bilateral salpingo-oophorectomy, excision of multiple endometrial implants under general anesthesia.  The procedure was completed without any complications and the patient was returned to the recovery room in excellent condition.  Pathology report reveals acute and chronic cervicitis, benign secretory phase endometrium, unremarkable myometrium, serosal fibrous adhesions of the uterus, bilateral ovarian tubes - endometriosis, fibrous adhesions, no malignancy identified.  Separate fragment of skin, benign skin tissue, with dermal fibrosis.  Her postoperative course was without complications and she was discharged on the second postoperative day in excellent condition.  FINAL DISCHARGE INSTRUCTIONS:  No heavy lifting or straining, no vaginal entrance, increase ambulation as tolerated.   She is to call for any fever of over 100.5 or failure of daily improvement.  Diet regular.  DISCHARGE MEDICATIONS: 1. Climara patch 0.075 mg weekly. 2. Tylox 1 p.o. q.2h. p.r.n. discomfort. 3. Vioxx 25 mg daily.  She is to return to the office in one week for followup.  CONDITION ON DISCHARGE:  Excellent.  FINAL DISCHARGE DIAGNOSES: 1. Stage 3 endometriosis with bilateral ovarian endometrioma. 2. Incapacitated cyclic pelvic pain.  PROCEDURES PERFORMED:  Total abdominal hysterectomy, bilateral salpingo-oophorectomy, excision of multiple endometrial implants under  general anesthesia. Dictated by:  Jeani Sow, R.N., F.N.P. Attending:  Gretta Cool, M.D. DD:  04/15/01 TD:  04/15/01 Job: [id number]

## 2010-08-19 NOTE — Discharge Summary (Signed)
Colleen Dillon, BROCKWELL               ACCOUNT NO.:  [id number]   MEDICAL RECORD NO.:  [id number]          PATIENT TYPE:  INP   LOCATION:  3701                         FACILITY:  [facility]   PHYSICIAN:  Richard A. Alanda Amass, M.D.DATE OF BIRTH:  [birth year]   DATE OF ADMISSION:  03/18/2006  DATE OF DISCHARGE:  03/19/2006                               DISCHARGE SUMMARY   DISCHARGE DIAGNOSES:  1. Chest pain, myocardial infarction ruled out, and Cardiolite      negative for ischemia.  2. Known coronary disease with previous acute inferior myocardial      infarction February 2006 with subsequent percutaneous coronary      intervention and stenting, restudy December 2006 with placement of      a fixed stent in the RCA and restudied February 2007, with plans      for medical therapy only at that time.  3. Mild left ventricular dysfunction with an ejection fraction of 45%      at catheterization February 2007.  4. Treated dyslipidemia.   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  She has a history of coronary disease.  She had inferior myocardial infarction in February 2006 with RCA  stenting.  She apparently had abrupt stent closure and underwent a third  stent after that.  She was cathed again in December 2006 and had a  fourth stent placed in the RCA.  She was studied in February and was  treated medically.  She had a Cardiolite in June 2007 that showed  inferior scar but no ischemia.  She presented March 18, 2006 with  increasing chest pain in the situation of increased stress at home.  She  did get some relief with nitroglycerin.  She initially presented to  [facility] and was transferred to [facility].  She was admitted by Dr.  Jenne Campus.  Enzymes and troponin were negative.  EKG showed no significant  changes.  She was admitted to telemetry and started on subcutaneous  Lovenox and Imdur.  Cardiolite study was scheduled for the 17th.   The  patient had an adenosine Cardiolite which he tolerated well and was  negative for ischemia and showed normal LV function.  She has not had  recurrent chest pain.  We feel she can be discharged today and follow-up  with Dr. Alanda Amass after the first of the year.   DISCHARGE MEDICATIONS:  1. Plavix 75 mg a day.  2. Zocor 20 mg a day.  3. Prilosec OTC once or twice a day as needed.  4. Toprol XL 25 mg 1/2 tablet a day.  5. Niacin 500 mg h.s.  6. Aspirin 81 mg a day.  7. Prometrium as taken at home.   LABS:  White count 5.1, hemoglobin 11.7, hematocrit 34.2, platelets 300.  Sodium 137, potassium 3.8, BUN 10, creatinine 0.9.  CK-MB and troponins  were negative x3.  Lipid panel shows cholesterol of 130, HDL 43, LDL 71.   DISPOSITION:  The patient is discharged in stable condition and will  follow up with Dr.  Alanda Amass as an outpatient.      Colleen Dillon, P.A.      Richard A. Alanda Amass, M.D.  Electronically Signed    LKK/MEDQ  D:  03/19/2006  T:  03/19/2006  Job:  [id number]   cc:   Gerlene Burdock A. Alanda Amass, M.D.

## 2010-08-19 NOTE — H&P (Signed)
[facility]  Patient:    Colleen Dillon, Colleen Dillon [facility] Visit Number: [id number] MRN: [id number]          Service Type: Attending:  Gretta Cool, M.D. Dictated by:   Gretta Cool, M.D.                           History and Physical  PREOPERATIVE DIAGNOSIS:  Stage III endometriosis with bilateral ovarian endometriomas and incapacitating cyclic pelvic pain.  POSTOPERATIVE DIAGNOSIS:  Stage III endometriosis with bilateral ovarian endometriomas and incapacitating cyclic pelvic pain.  PROCEDURES: 1. Total abdominal hysterectomy. 2. Bilateral salpingo-oophorectomy. 3. Excision of multiple endometrial implants.  DESCRIPTION OF PROCEDURE:  Under excellent general anesthesia with orotracheal tube, the Pfannenstiel incision was made by excision of the previous scar. The incision was then extended through the fascia.  The rectus muscles were separated in the midline and the peritoneum opened.  The abdomen was explored. No abnormalities identified.  Examination of the pelvis revealed bilateral ovarian endometriomas with evidence of uteroperitoneal fistula probably from previous tubal cautery sterilization procedure.  The endometriosis was most significant on the left with virtually complete destruction of the left ovary.  There was also a right ovarian endometrioma and extensive disease involving the ovary and its surrounding structures.  At this point, the decision was made to proceed to total abdominal hysterectomy and bilateral salpingo-oophorectomy.  The round ligaments were then transected, the anterior leaf of the broad ligament opened, and the bladder pushed off of the lower segment.  Great care was taken because of her previous cesarean section.  The infundibulopelvic vessels were then isolated from the ureter, clamped, cut, sutured, and tied with 0 Vicryl.  During the dissection process, the large left ovarian endometrioma ruptured and spilled and was  evacuated and the pelvis irrigated free of the debris.  The uterine vessels were then clamped, cut, sutured, and tied with 0 Vicryl.  The cardinal and uterosacral ligaments were then likewise clamped, cut, sutured, and tied with 0 Vicryl.  The vagina was then entered and the cervix excised.  The cuff was closed with a running suture of 0 Vicryl.  The cardinal uterosacral complex culposuspension was then performed using 0 Ethibond.  At this point, the pelvic peritoneum was closed with a running suture of 2-0 Monocryl.  The packs and retractors were then removed and the pelvis irrigated to remove all debris.  The abdominal peritoneum was then closed with a running suture of 0 Monocryl.  The rectus muscles were plicated with the same suture of 0 Monocryl.  The fascia was approximated with a running suture of 0 Vicryl from the vagina to the midline. The subcutaneous tissues were approximated with interrupted sutures of 3-0 Vicryl.  The skin was closed with skin staples and Steri-Strips.  At the end of the procedure, the sponge and lap counts were correct with no complications.  The patient returned to the recovery room in excellent condition. Dictated by:   Gretta Cool, M.D. Attending:  Gretta Cool, M.D. DD:  03/14/01 TD:  03/14/01 Job: [id number]

## 2010-08-19 NOTE — Discharge Summary (Signed)
Colleen Dillon, Colleen Dillon NO.:  [id number]   MEDICAL RECORD NO.:  [id number]          PATIENT TYPE:  INP   LOCATION:  4740                         FACILITY:  [facility]   PHYSICIAN:  Nicki Guadalajara, M.D.     DATE OF BIRTH:  28-Jan-1966   DATE OF ADMISSION:  05/18/2004  DATE OF DISCHARGE:                                 DISCHARGE SUMMARY   ADDENDUM:   DISCHARGE MEDICATIONS:  1.  TRITON study drug.  2.  Aspirin 325 mg daily.  3.  Niacin 500 mg one daily h.s.  Aspirin to be taken with Niacin.  4.  Metoprolol 50 mg half tablet b.i.d.  5.  Protonix 40 mg daily x1 month.  6.  Nitroglycerin sublingual 1/150 sublingual p.r.n.  7.  Zocor 20 mg daily.   FOLLOW UP:  Follow-up with our office and with Dr. Gerda Diss.      WDJ/MEDQ  D:  05/24/2004  T:  05/24/2004  Job:  [id number]

## 2010-08-19 NOTE — H&P (Signed)
NAMERAKHI, ROMAGNOLI NO.:  [id number]   MEDICAL RECORD NO.:  [id number]          PATIENT TYPE:  EMS   LOCATION:  ED                           FACILITY:  [facility]   PHYSICIAN:  Ulyses Amor, MD DATE OF BIRTH:  [birth year]   DATE OF ADMISSION:  05/22/2005  DATE OF DISCHARGE:                                HISTORY & PHYSICAL   Colleen Dillon is a 45 year old black woman who is admitted to [facility] for further evaluation of chest pain.   The patient has a history of coronary artery disease which dates back to  February 2006. At that time she suffered an acute inferior myocardial  infarction. Emergent cardiac catheterization demonstrated complete occlusion  of the right coronary artery. This was stented. She developed post procedure  dissection and occlusion of the right coronary artery which required re-  stenting. In December 2006, the right coronary artery was again stented.   The patient presented to the emergency department  with chest pain which  began this morning. It has been present throughout the course of the day,  though it has waxed and waned in intensity. The chest pain is described as a  substernal pressure. It radiates to the left neck. It is not associated with  dyspnea, diaphoresis, or nausea. There are no exacerbating or ameliorating  factors other than improvement with nitroglycerin. It appears not to be  related to position, activity, meals, or respirations. She notes that the  chest pain is the same quality as that which heralded her acute myocardial  infarction. Her chest pain is significantly improved at this time, though  not completely resolved.   The patient has a history of dyslipidemia. There is no history of diabetes  mellitus, hypertension, or family history of early coronary artery disease.  There is no significant smoking history.   PAST MEDICAL HISTORY:  Otherwise unremarkable.   MEDICATIONS:  Aspirin, metoprolol,  niacin, Plavix, Zocor, and Prometrium.   ALLERGIES:  PENICILLIN causes a rash and swelling.   PAST SURGICAL HISTORY:  Abdominal hysterectomy and oophorectomy.   SOCIAL HISTORY:  The patient lives with her husband and children. She works  as a Oncologist. She does not drink alcohol.   FAMILY HISTORY:  Her father died of lung cancer. Her mother had  hypertension. Family history is otherwise unremarkable.   REVIEW OF SYSTEMS:  No new problems related to her head, eyes, ears, nose,  mouth, throat, lungs, gastrointestinal system, genitourinary system, or  extremities. There is no history of neurologic or psychiatric disorder.  There is no history of fever, chills, or weight loss.   PHYSICAL EXAMINATION:  VITAL SIGNS: Blood pressure 122/76, pulse 74 and  regular, respirations 18, temperature 97.5.  GENERAL: The patient is an obese, middle-age white woman in no distress. She  is alert, oriented, appropriate, and responsive.  HEENT: Normal.  NECK: Without thyromegaly or adenopathy. Carotid pulses are palpable  bilaterally without bruits.  CARDIAC: Normal S1 and S2. There is no S3, S4, murmur, rub, or click.  Cardiac rhythm is regular. No chest wall tenderness is  noted.  LUNGS: Clear.  ABDOMEN: Soft and nontender. There is no mass, hepatosplenomegaly, bruit,  distention, rebound, guarding, or rigidity. Bowel sounds are normal.  BREASTS/PELVIC/RECTAL EXAM: Not performed and not pertinent to the reason  for acute care hospitalization.  EXTREMITIES: Without edema, deviation, or deformity. Radial and dorsalis  pedis pulses are palpable bilaterally.  NEUROLOGIC: Brief screening neurologic survey is unremarkable.   The electrocardiogram demonstrated evidence of a prior inferior myocardial  infarction. There are no changes specific for acute ischemia or infarction.   The initial set of cardiac markers reveal a myoglobin of 63.1, CK-MB 1.4,  and troponin less than 0.05.  The second set  of cardiac markers revealed a  myoglobin of 50.7, CK-MB less than 1.0, and troponin less than 0.05.  Potassium was 3.6, BUN 13, creatinine 0.9. White count was 7.6 with a  hemoglobin of 12.4, and hematocrit 36.3. The remaining studies are pending  at the time of this dictation.   The chest radiograph, according to the radiologist, demonstrated no evidence  of acute cardiopulmonary disease. The remaining studies are pending at the  time of this dictation.   IMPRESSION:  1.  Unstable angina.  2.  Coronary artery disease, status post inferior myocardial infarction in      February 2006. Cardiac catheterization revealed an occluded right      coronary artery which was stented. The patient developed post procedure      dissection and occlusion which required re-stenting. The vessel was      again stented in mid December 2006.  3.  Dyslipidemia.   PLAN:  1.  Telemetry.  2.  Serial cardiac enzymes.  3.  Aspirin.  4.  Intravenous nitroglycerin.  5.  Intravenous heparin.  6.  Plavix.  7.  Metoprolol.  8.  Further measures per Dr. Jenne Campus.   This patient encounter was chaperoned by nurse Yetta Numbers, MD  Electronically Signed     MSC/MEDQ  D:  05/22/2005  T:  05/22/2005  Job:  [id number]   cc:   Darlin Priestly, MD  Fax: [phone]

## 2010-08-19 NOTE — H&P (Signed)
NAMEARSHI, DUARTE NO.:  [id number]   MEDICAL RECORD NO.:  [id number]          PATIENT TYPE:  EMS   LOCATION:  ED                            FACILITY:  [facility]   PHYSICIAN:  Osvaldo Shipper, MD     DATE OF BIRTH:  1965-06-02   DATE OF ADMISSION:  05/17/2004  DATE OF DISCHARGE:  LH                                HISTORY & PHYSICAL   PRIMARY CARE PHYSICIAN:  Donna Bernard, M.D.   ADMISSION DIAGNOSIS:  Non-ST elevation myocardial infarction.   CHIEF COMPLAINT:  Chest pain lasting about 45 minutes.   HISTORY OF PRESENT ILLNESS:  The patient is a 45 year old Caucasian female  with no significant past medical history, presents with onset of chest pain  about 45 minutes prior to arrival in the emergency room.  The patient  mentioned that she has been getting chest pain for the past three months,  each episode lasting no more than three to five minutes and going away by  itself.  The patient describes chest pain as a pressure-like sensation over  the retrosternal area associated with shortness of breath.  The onset of  pain is usually at rest and the character of the pain or the intensity does  not change with exertion.  There were no other exacerbations or alleviating  factors.  Usually the chest pain that she gets does not radiate but today  the pain lasted longer than usual and the pressure sensation radiated to her  neck, her jaw as well as to both her arms.  The patient got increasingly  nauseous and she did have one episode of vomiting on the way to the  emergency room.  The patient also gave history of dizziness but did not have  any syncopal episode.  The patient did give any history of palpitations but  she did give history of diaphoresis.  The patient has not taken any  medication for this pressure sensation or any of the previous episodes.  Today when she came to the emergency room, she was given aspirin and  nitroglycerin and after the third  nitroglycerin, the pain dissipated.  Currently, the patient is lying comfortably in bed and denies any chest  pain.  The patient was put on nitroglycerin IV infusion and she mentioned  that she gets the same pressure sensation lasting just a few seconds on and  off.  Currently she is also not short of breath.   MEDICATIONS:  The patient takes prometrium hormonal therapy as well as  nuvaring. She is on no other medications.   ALLERGIES:  The patient is allergic to PENICILLIN with which she gets skin  rash and swelling.   PAST MEDICAL HISTORY:  1.  The patient denies any medical problems in the past.  2.  She has had total abdominal hysterectomy with bilateral salpingo-      oophorectomy done about three years ago.  3.  She also mentions having cesarean sections in the past.   SOCIAL HISTORY:  The patient lives with her husband and her children.  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  She smoked cigarettes for  about two years.  This was about 23 years ago and she has been off cigarette  use since then.  She denies any alcohol use or any other illicit drug use.   FAMILY HISTORY:  The patient's father died of lung cancer.  The patient's  mother has history of hypertension.  Specifically there is no positive  history for any coronary artery disease, any diabetes or any CVA.   REVIEW OF SYSTEMS:  A 10-point review of systems was done which was negative  for any positive findings.   PHYSICAL EXAMINATION:  VITAL SIGNS: The patient was afebrile.  Blood  pressure was 111/53.  On arrival her blood pressure was 126/66.  Pulse rate  was about 67 beats per minute.  Respiratory rate of about 14 breaths per  minute.  She was oxygenating 100% on 2 L nasal cannula.  GENERAL:  The patient is an obese white female in no apparent distress.  HEENT:  No pallor, no icterus.  Mucous membranes moist with no oral lesions.  NECK:  Soft and supple.  LUNGS:  Clear to auscultation bilaterally  with no wheezing or rhonchi or  crackles.  CARDIOVASCULAR:  S1 and S2 normal, regular.  No murmurs appreciated.  No S3  or S4 seen.  No carotid bruits were heard.  No JVD was seen.  All peripheral  pulses palpable.  No pedal edema was present.  ABDOMEN:  Soft, nontender, nondistended.  Bowel sounds were heard.  No  organomegaly was appreciated.  EXTREMITIES:  Without any edema.  NEUROLOGIC: The patient was grossly intact.   LABORATORY DATA:  Her CBC was unremarkable.  Coagulation profile also  unremarkable.  On BMP, her sodium was 134, potassium 3.4, chloride 102,  bicarb 26, glucose 113, BUN 10, creatinine 0.9.  Cardiac enzymes:  The first  two troponins were less than 0.05 with the third one going up to 2.1.  CK-  MB, the third one, also positive at 13.4.  Chest x-ray did not show any  acute abnormality.  EKG:  There are two EKGs which are available.  One done  at the time of initial presentation which shows a sinus rhythm at a rate of  about 64 each minute.  Normal axis.  There is some ST depression  with T  inversion in I, aVL, V1 and V2.  There are some signs of early  repolarization seen.  The subsequent EKG done about an hour-and-a-half after  the first one shows ventricular bigeminy but similar ST changes as seen  before.   IMPRESSION:  This is a 45 year old white female with no significant past  medical history presented with chest pain and had now ruled in for non-ST  elevation myocardial infarction.  We will continue the patient on aspirin  and continue the patient on nitroglycerin infusion.  The nitroglycerin drip  can be weaned off if the patient remains chest pain free.  Heparin protocol  will be initiated for anticoagulation.  The patient was also started on  Integrillin. We will start the patient on low-dose beta blockers as well as  a statin.  We will leave the patient NPO for now for any possible cardiac intervention tomorrow.  I will consult [facility]  and most  likely the patient will undergo a coronary angiogram for which he will need  to be transferred to [facility].      GK/MEDQ  D:  05/17/2004  T:  05/18/2004  Job:  [id number]   cc:   Donna Bernard, M.D.  [address]  [place]  [place] [address]  Fax: [phone]

## 2010-08-19 NOTE — Discharge Summary (Signed)
NAMEMEGGAN, DHALIWAL NO.:  [id number]   MEDICAL RECORD NO.:  [id number]          PATIENT TYPE:  OBV   LOCATION:  3705                         FACILITY:  [facility]   PHYSICIAN:  Darlin Priestly, MD  DATE OF BIRTH:  [birth year]   DATE OF ADMISSION:  05/22/2005  DATE OF DISCHARGE:  05/24/2005                                 DISCHARGE SUMMARY   HOSPITAL COURSE:  Ms. Rinck is a 45 year old female patient with prior  coronary artery disease.  She had an inferior MI in February 2006.  She had  an occluded RCA; she was stented.  Post procedure, she had dissection and  occlusion, for which she then had restenting.  I believe she had some  progressive disease and she was restented, December '06.  She came into  [facility] secondary to chest pain.  She was put on IV heparin and  nitroglycerin.  Her CK-MBs were negative x3.  It was decided because of the  complex procedure of her RCA that she should undergo recath; this was  performed on May 23, 2005 by Dr. Lenise Herald.  She had no high-grade  in-stent restenosis.  Her other vessels were open.  Her EF was 45% to 50%.  She had some inferior hypokinesis.  It was decided that she could be  discharged home; however, she would not be able to go home because until  very late into the night, so she was kept overnight.  Her heparin was  discontinued post cath.   LABORATORY DATA:  Her hemoglobin was 12.4, hematocrit 36.3, WBC of 7.6.  Sodium 140, potassium 3.6, BUN 13, creatinine 0.9.  AST was 20, ALT was 14.  Total cholesterol was 141, triglycerides 85, HDL was 45, LDL was 79.  CK-MBs  were negative x3.   Chest x-ray shows no active pulmonary disease.   DISCHARGE MEDICATIONS:  1.  Metoprolol 12.5 mg in the morning, metoprolol 25 mg in the p.m.  2.  Niaspan 500 mg every night.  3.  Zocor 20 mg every night.  4.  Aspirin 81 mg every day.  5.  Triton study drug as previously taken.  6.  Prometrium 10 mg every  day.   DISCHARGE DIAGNOSES:  1.  Chest pain, not cardiac ischemia, secondary to cardiac catheterization      showing no progressive or high-grade disease.  2.  Known coronary artery disease with history of inferior myocardial      infarction with stenting x3 in the past.  3.  Hyperlipidemia.  4.  Ischemic cardiomyopathy with an ejection fraction of 40% to 45%.  5.  Triton Study participant.  6.  Low blood pressure, unable to up-titrate her medications secondary to      her low blood pressure.  7.  Episode of sinus tachycardia when up in her room.      Lezlie Octave, N.P.      Darlin Priestly, MD  Electronically Signed    BB/MEDQ  D:  05/24/2005  T:  05/25/2005  Job:  [id number]   cc:   Lorin Picket A. Gerda Diss, MD  Fax: [phone]

## 2010-08-19 NOTE — Discharge Summary (Signed)
[name], Colleen Dillon               ACCOUNT NO.:  [id number]   MEDICAL RECORD NO.:  [id number]          PATIENT TYPE:  INP   LOCATION:  IC03                          FACILITY:  [facility]   PHYSICIAN:  Osvaldo Shipper, MD     DATE OF BIRTH:  1966/03/14   DATE OF ADMISSION:  05/17/2004  DATE OF DISCHARGE:  02/15/2006LH                                 DISCHARGE SUMMARY   DISCHARGE DIAGNOSIS:  ST elevation myocardial infarction.   Please review the H&P dictated on February 14 for details regarding the  patient's present illness.   HOSPITAL COURSE:  The patient is a 45 year old Caucasian female with no  significant past medical history who presented with chest pain.  She was  found to have minimal but 1 mm ST elevation in II, III, and AVF.  The  patient initially did not have elevation in cardiac enzymes.  She was  started on heparin and nitroglycerin in the ER.  Dr. Effie Shy from cardiology  was consulted over the phone, and the patient was felt stable enough to be  monitored at [facility] and then transferred for coronary angiogram  the following morning.  However, the patient started developing several  episodes of nonsustained VT.  She was asymptomatic with these arrhythmias.  The vital signs also remained stable.  The patient was subsequently started  on Lidocaine.  She was also started on Integrilin earlier.  Dr. Effie Shy was  again contacted and it was felt the patient should be transferred to [facility] sooner than later; at which point, the patient was sent over to [facility] for an urgent coronary angiogram.   DISCHARGE MEDICATIONS:  Will be determined once the patient is discharged  from [facility].  At the time the patient was transferred she was  on IV heparin, IV Integrilin, IV nitroglycerin, IV lidocaine.  She was  started on aspirin, metoprolol, and she was started on Lipitor.  Once again,  the final discharge medication will be determined at [facility].   DIET:  The patient will probably follow a cardiac diet, but again will be  determined at the time of discharge.  The patient will be following up with  Dr. Gerda Diss once she is discharged from [facility].      GK/MEDQ  D:  05/25/2004  T:  05/26/2004  Job:  [id number]   cc:   Donna Bernard, M.D.  [address]  [place]  [place] [address]  Fax: [phone]

## 2010-08-19 NOTE — Procedures (Signed)
Colleen Dillon, Colleen Dillon               ACCOUNT NO.:  [id number]   MEDICAL RECORD NO.:  [id number]          PATIENT TYPE:  INP   LOCATION:  2929                         FACILITY:  [facility]   PHYSICIAN:  Edward L. Juanetta Gosling, M.D.DATE OF BIRTH:  1965/04/21   DATE OF PROCEDURE:  DATE OF DISCHARGE:                                EKG INTERPRETATION   At 2132 on May 17, 2004, the rhythm appears to be a ventricular  bigeminy.  The P-R interval in part of the beats is very short.  There are  ST/T wave abnormalities which are nonspecific.  Abnormal electrocardiogram.      ELH/MEDQ  D:  05/18/2004  T:  05/18/2004  Job:  [id number]

## 2010-08-19 NOTE — H&P (Signed)
[facility]  Patient:    Colleen Dillon, Colleen Dillon Visit Number: [id number] MRN: [id number]          Service Type: Attending:  Gretta Cool, M.D. Dictated by:   Gretta Cool, M.D. Adm. Date:  03/14/01   CC:         Lilyan Punt, M.D.   History and Physical  CHIEF COMPLAINT:  Severe endometriosis with incapacitating pain.  HISTORY OF PRESENT ILLNESS:  A 45 year old G2, P2, under the primary care of Lilyan Punt, M.D., in [employer] [place].  She has been under evaluation for complaint of right lower quadrant pain persistent for some six months.  It is often colicky in nature, rising to a peak and then easing.  It is often induced by intercourse and lasts for several hours after intercourse on every occasion.  She has had ultrasound evaluation, which determined a complex left adnexal mass.  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  She now is admitted for definitive therapy by lysis of adhesions, total abdominal hysterectomy, bilateral salpingo-oophorectomy, and excision of peritoneal endometriosis.  PAST MEDICAL HISTORY:  Usual childhood disease without sequelae.  Medical illnesses:  None.  PAST SURGICAL HISTORY:  Cesarean section in June 1989 and tubal sterilization September 1989, both elsewhere.  OTHER HOSPITALIZATIONS:  Vaginal delivery.  FAMILY HISTORY:  Father died of lung cancer.  Mother has severe rheumatoid arthritis.  She has six siblings, all in good health.  No other known familial tendency.  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  Her husband is a Visual merchandiser.  They have two children, one still living at home.  Habits:  Denies ethanol, tobacco, or recreational drugs.  REVIEW OF SYSTEMS:   HEENT:  Denies symptoms.  CARDIORESPIRATORY:  Denies asthma, cough, bronchitis, shortness of breath.  GASTROINTESTINAL/ GENITOURINARY:  Pain as above of very recent onset.  No other significant complaints.  PHYSICAL EXAMINATION:  GENERAL:  A well-developed, well-nourished white female.  HEENT:  Pupils react to light and accommodate.  Fundi not examined. Oropharynx clear.  NECK:  Supple without mass or thyroid enlargement.  CHEST:  Clear P to A.  BREASTS:  Soft without nodes or nipple discharge.  ABDOMEN:  Soft, scaphoid, without mass or organomegaly.  Recent laparoscopy scar is noted.  Previous cesarean section scar also noted.  PELVIC:  External genitalia normal female.  Vagina clean, rugous, with adequate support.  She has well-supported cervix with and uterus with adjacent left adnexal mass that is affixed to the pelvic wall and tender to manipulation.  The right adnexa is more difficult to palpate particularly because of her guarding during examination.   The rectovaginal exam confirms.  EXTREMITIES:  Negative.  NEUROLOGIC:  Physiologic.  IMPRESSION: 1. Stage 3 endometriosis with bilateral ovarian endometriomas and laparoscopic    evidence of uteroperitoneal fistula and intermittent rupture and leakage of    the endometriomas. 2. History of disk disease.  PLAN:  Total abdominal hysterectomy, bilateral salpingo-oophorectomy, excision of endometrial implant.  Note she understands the risks and benefits of the procedure, alternative therapies.  She is now admitted for definitive care. Dictated by:   Gretta Cool, M.D. Attending:  Gretta Cool, M.D. DD:  03/15/01 TD:  03/15/01 Job: [id number]

## 2010-08-19 NOTE — Procedures (Signed)
Colleen Dillon, PINGLETON               ACCOUNT NO.:  [id number]   MEDICAL RECORD NO.:  [id number]          PATIENT TYPE:  INP   LOCATION:  2929                         FACILITY:  [facility]   PHYSICIAN:  Edward L. Juanetta Gosling, M.D.DATE OF BIRTH:  1965/05/05   DATE OF PROCEDURE:  DATE OF DISCHARGE:                                EKG INTERPRETATION   The first EKG is at 2007 on May 17, 2004.   The rhythm is sinus rhythm with a rate in the 60s.  There is an incomplete  right bundle branch block.  There are ST/T wave abnormalities with some  suggestion of ST elevation, and this may indicate injury.  Clinical  correlation is suggested.   T wave abnormality in the lateral limb lead is suggestive of ischemia.      ELH/MEDQ  D:  05/18/2004  T:  05/18/2004  Job:  [id number]

## 2010-08-19 NOTE — Discharge Summary (Signed)
NAMEAUBRIAUNA, Colleen Dillon               ACCOUNT NO.:  [id number]   MEDICAL RECORD NO.:  [id number]          PATIENT TYPE:  OIB   LOCATION:  6522                         FACILITY:  [facility]   PHYSICIAN:  Richard A. Alanda Amass, M.D.DATE OF BIRTH:  [birth year]   DATE OF ADMISSION:  03/21/2005  DATE OF DISCHARGE:  03/22/2005                                 DISCHARGE SUMMARY   DISCHARGE DIAGNOSES:  1.  Unstable angina pectoris status post catheterization with percutaneous      transluminal coronary angioplasty of the RCA for InStent restenosis.  2.  Known coronary artery disease, status post previous intervention in      February 2006.  3.  Hyperlipidemia.  4.  Hormone replacement therapy.  5.  Gastroesophageal reflux disease.  6.  Obesity.   HISTORY:  This is a 45 year old Caucasian female patient of Dr. Alanda Amass  who presented to our office on March 14, 2005 with complaints of chest  pain and she was scheduled for coronary angiography at [facility].   HOSPITAL COURSE:  Procedure:  Cardiac catheterization with intervention  performed by Dr. Alanda Amass on March 21, 2005.  Catheterization showed  greater than 90% InStent restenosis of the RCA and cutting balloon  angioplasty of the RCA was performed with a drug-eluting Taxus stent  deployed in the lesion.  The patient tolerated the procedure well and was  given Aggrastat bolus and drip.  She is enrolled in the [place].   The next morning she was assessed by Dr. Jenne Campus who found her to be stable  for discharge home.   LABORATORIES:  In the hospital revealed normal results.  BUN 8, creatinine  0.9, potassium 3.5.  Hemoglobin 11.2, hematocrit 32.2.  White blood cells  count 4.8, platelets 159.   Her vital signs were stable and the patient was discharged home.   DISCHARGE MEDICATIONS:  1.  [place] Study drug.  2.  Aspirin 325 mg daily.  3.  Lopressor 25 mg q.a.m. and 12.5 mg q.p.m.  4.  Protonix 40 mg b.i.d.  5.  Niacin  500 mg q.h.s.  6.  Prometrium 100 mg daily.  7.  Nitroglycerin p.r.n.  8.  Zocor 20 mg daily.   DISCHARGE INSTRUCTIONS:  The patient was instructed to start low fat, low  cholesterol diet.  Not to drive or lift heavy objects for 3 days post  catheterization and revert any problems to our office. The phone number was  provided.   DISCHARGE FOLLOWUP:  Dr. Alanda Amass will see him on April 21, 2005 at 12  p.m.      Raymon Mutton, P.A.      Richard A. Alanda Amass, M.D.  Electronically Signed    MK/MEDQ  D:  03/22/2005  T:  03/24/2005  Job:  [id number]   cc:   Gerlene Burdock A. Alanda Amass, M.D.  Fax: [phone]

## 2010-08-19 NOTE — Cardiovascular Report (Signed)
Colleen Dillon, Colleen Dillon NO.:  [id number]   MEDICAL RECORD NO.:  [id number]          PATIENT TYPE:  OBV   LOCATION:  3705                         FACILITY:  [facility]   PHYSICIAN:  Darlin Priestly, MD  DATE OF BIRTH:  1965/08/30   DATE OF PROCEDURE:  05/23/2005  DATE OF DISCHARGE:  05/24/2005                              CARDIAC CATHETERIZATION   PROCEDURE:  1.  Left heart catheterization.  2.  Coronary angiography.  3.  Left ventriculogram.   ATTENDING PHYSICIAN:  Darlin Priestly, MD   COMPLICATIONS:  None.   INDICATION:  Colleen Dillon is a 45 year old female patient of Dr. Susa Griffins with a history of hyperlipidemia, history of anterior wall  myocardial infarction in February 2006 with subsequent PTCA and stenting of  her RCA by Dr. Alanda Amass. That evening she had acute closure of RCA with  repeat catheterization by Dr. Daphene Jaeger. Initially, she had a Liberte 5.0 x  20 mm stenosis placed in her mid RCA and a 4.5 x 6 mm stent in her distal  RCA. Following Dr. Landry Dyke procedure she had a 3.5 x 23-mm stent in the mid  RCA with a 3.0 x 18-mm stent extending into the PDA. She had a repeat  catheterization by Dr. Alanda Amass in December 2006 with subsequent placement  of a Taxus 3.5 x 32-mm stent in the mid portion of the RCA in an uncovered  area. She was re-admitted on May 22, 2005, with recurrent substernal  chest pain which was nitrate responsive. She does subsequently rule out for  myocardial infarction and had no EKG changes. However, her symptoms were  exactly likely her prior angina symptoms. She is now referred for repeat  cardiac catheterization.   DESCRIPTION OF PROCEDURE:  After being given informed consent, the patient  was brought to the cardiac catheterization lab. The right and left groin  were shaved, prepped and draped in the usual sterile fashion. ACT monitoring  established. Using modified Seldinger technique, a #6-French arterial  sheath  was inserted in the right femoral artery. A 6-French diagnostic catheter was  used to perform diagnostic angiography.   The  left main is a large vessel without significant disease.   The LAD is a medium to large size vessel which courses the vessel to one  large diagonal branch. The LAD is a small vessel toward apex, but there is  no high-grade stenosis.   The first diagonal is a medium to large size vessel which bifurcates the mid  segment and gives a long collateral branch to what appears to be a PDA  extending all the way around the apex. The remainder of the diagonal has no  significant disease.   The left circumflex is a medium size vessel, coursing the AV groove, using  one obtuse marginal branch. The AV groove circumflex has no significant  disease.   The first __________ is a medium size vessel with no significant disease.   The RCA is a large vessel which is dominant.  PDA shows a __________  posterolateral branch. There are approximately four overlapping stents noted  in the RCA. The two stents in the mid portion of the RCA appear to be widely  patent with mild 20% density and re-stenosis. There is a gap in the distal  mid portion of the RCA with 30% stenotic lesion between the stents. There  have been two other overlapping stents in the distal RCA extending into the  PDA with a 40% lesion just prior to the bifurcation of the PDA and what  appears to be a bending segment of the stent. There is a TIMI-III flow  throughout. The PLA is a small to medium size with no significant disease.  Again, there are left to right collaterals from the distal PDA.   The left ventriculogram reveals a mildly depressed EF of 45% to 50% with  mild inferior hypokinesis.   HEMODYNAMICS:  Systemic arterial pressure 115/70, LV systolic pressure  116/80, LVEDP of 16.   CONCLUSION:  1.  Noncritical coronary artery disease with patent right coronary artery      stents.  2.  Mildly  depressed left ventricular systolic function with wall motion      abnormalities as noted above.      Darlin Priestly, MD  Electronically Signed     RHM/MEDQ  D:  05/23/2005  T:  05/24/2005  Job:  [id number]   cc:   Gerlene Burdock A. Alanda Amass, M.D.  Fax: [phone]

## 2010-10-16 ENCOUNTER — Inpatient Hospital Stay (HOSPITAL_COMMUNITY)
Admission: EM | Admit: 2010-10-16 | Discharge: 2010-10-17 | DRG: 313 | Disposition: A | Discharge status: Home / Self Care | Payer: 59 | Attending: Internal Medicine | Admitting: Internal Medicine

## 2010-10-16 ENCOUNTER — Emergency Department (HOSPITAL_COMMUNITY): Payer: 59

## 2010-10-16 DIAGNOSIS — K219 Gastro-esophageal reflux disease without esophagitis: Secondary | ICD-10-CM | POA: Diagnosis present

## 2010-10-16 DIAGNOSIS — R0789 Other chest pain: Principal | ICD-10-CM | POA: Diagnosis present

## 2010-10-16 DIAGNOSIS — E876 Hypokalemia: Secondary | ICD-10-CM | POA: Diagnosis present

## 2010-10-16 DIAGNOSIS — Z79899 Other long term (current) drug therapy: Secondary | ICD-10-CM

## 2010-10-16 DIAGNOSIS — Z7982 Long term (current) use of aspirin: Secondary | ICD-10-CM

## 2010-10-16 DIAGNOSIS — I2510 Atherosclerotic heart disease of native coronary artery without angina pectoris: Secondary | ICD-10-CM | POA: Diagnosis present

## 2010-10-16 DIAGNOSIS — Z9861 Coronary angioplasty status: Secondary | ICD-10-CM

## 2010-10-16 DIAGNOSIS — E785 Hyperlipidemia, unspecified: Secondary | ICD-10-CM | POA: Diagnosis present

## 2010-10-16 DIAGNOSIS — Z7902 Long term (current) use of antithrombotics/antiplatelets: Secondary | ICD-10-CM

## 2010-10-16 LAB — BASIC METABOLIC PANEL
BUN: 8 mg/dL (ref 6–23)
CO2: 24 mEq/L (ref 19–32)
Chloride: 108 mEq/L (ref 96–112)
Glucose, Bld: 77 mg/dL (ref 70–99)
Potassium: 3.4 mEq/L — ABNORMAL LOW (ref 3.5–5.1)
Sodium: 143 mEq/L (ref 135–145)

## 2010-10-16 LAB — COMPREHENSIVE METABOLIC PANEL
AST: 10 U/L (ref 0–37)
Albumin: 3.7 g/dL (ref 3.5–5.2)
BUN: 9 mg/dL (ref 6–23)
Calcium: 9 mg/dL (ref 8.4–10.5)
Chloride: 106 mEq/L (ref 96–112)
Creatinine, Ser: 0.77 mg/dL (ref 0.50–1.10)
Total Bilirubin: 0.3 mg/dL (ref 0.3–1.2)
Total Protein: 6.9 g/dL (ref 6.0–8.3)

## 2010-10-16 LAB — TROPONIN I: Troponin I: <0.3 ng/mL (ref <0.30)

## 2010-10-16 LAB — CBC
MCHC: 34.6 g/dL (ref 30.0–36.0)
MCV: 88.5 fL (ref 78.0–100.0)
Platelets: 242 10*3/uL (ref 150–400)
RDW: 12.4 % (ref 11.5–15.5)
WBC: 4.1 10*3/uL (ref 4.0–10.5)

## 2010-10-16 LAB — CK TOTAL AND CKMB (NOT AT ARMC): Relative Index: INVALID (ref 0.0–2.5)

## 2010-10-16 LAB — DIFFERENTIAL
Basophils Absolute: 0 10*3/uL (ref 0.0–0.1)
Eosinophils Absolute: 0.1 10*3/uL (ref 0.0–0.7)
Eosinophils Relative: 2 % (ref 0–5)
Monocytes Absolute: 0.2 10*3/uL (ref 0.1–1.0)

## 2010-10-16 LAB — PROTIME-INR
INR: 0.98 (ref 0.00–1.49)
Prothrombin Time: 13.2 seconds (ref 11.6–15.2)

## 2010-10-16 LAB — HEMOGLOBIN A1C
Hgb A1c MFr Bld: 5.7 % — ABNORMAL HIGH (ref <5.7)
Mean Plasma Glucose: 117 mg/dL — ABNORMAL HIGH (ref <117)

## 2010-10-16 LAB — APTT: aPTT: 31 seconds (ref 24–37)

## 2010-10-17 ENCOUNTER — Inpatient Hospital Stay (HOSPITAL_COMMUNITY): Payer: 59

## 2010-10-17 LAB — CARDIAC PANEL(CRET KIN+CKTOT+MB+TROPI)
CK, MB: 1.7 ng/mL (ref 0.3–4.0)
Relative Index: INVALID (ref 0.0–2.5)
Total CK: 80 U/L (ref 7–177)
Total CK: 78 U/L (ref 7–177)
Troponin I: <0.3 ng/mL (ref <0.30)

## 2010-10-17 LAB — LIPID PANEL
Cholesterol: 135 mg/dL (ref 0–200)
Triglycerides: 52 mg/dL (ref <150)
VLDL: 10 mg/dL (ref 0–40)

## 2010-10-17 MED ORDER — TECHNETIUM TC 99M TETROFOSMIN IV KIT
10.0000 | PACK | Freq: Once | INTRAVENOUS | Status: AC | PRN
  Administered 2010-10-17: 10 via INTRAVENOUS

## 2010-10-17 MED ORDER — TECHNETIUM TC 99M TETROFOSMIN IV KIT
30.0000 | PACK | Freq: Once | INTRAVENOUS | Status: AC | PRN
  Administered 2010-10-17: 30 via INTRAVENOUS

## 2010-10-18 ENCOUNTER — Other Ambulatory Visit (HOSPITAL_COMMUNITY): Payer: 59

## 2010-10-19 NOTE — Discharge Summary (Signed)
  NAMEMACLAINE, AHOLA NO.:  [id number]  MEDICAL RECORD NO.:  [id number]  LOCATION:  2924                         FACILITY:  [facility]  PHYSICIAN:  Landry Corporal, MD DATE OF BIRTH:  January 26, 1966  DATE OF ADMISSION:  10/16/2010 DATE OF DISCHARGE:  10/17/2010                              DISCHARGE SUMMARY   DISCHARGE DIAGNOSES: 1. Chest pain worrisome for unstable angina, myocardial infarction     ruled out, Myoview negative for ischemia. 2. Known coronary artery disease with an acute inferior wall     myocardial infarction, February 2006 with intervention and     stenting, restudy December in 2006 with another intervention at the     right coronary artery at that time and a restudy in February 2007     with plans for medical therapy only. 3. Preserved left ventricular function by Myoview this admission. 4. Treated dyslipidemia.  HOSPITAL COURSE:  The patient is a 45 year old female works at [employer].  She has a history of coronary artery disease.  She had an inferior MI in February 2006 with an RCA stent. She had abrupt stent closure and underwent another stent after that. She was cathed again in December 2006 and had a fourth stent placed to her RCA.  She was restudied in February 2007 and has been treated medically.  She presented with chest pain worrisome for unstable angina. CK-MBs and troponins were obtained.  These were negative for an MI.  She underwent an exercise Myoview.  The patient exercised 9 minutes of the Bruce protocol without significant chest pain.  She did have some ST depression of 1.5 mm and lead V5 and V6.  Myoview images showed no significant ischemia.  We feel she can be discharged late on October 17, 2010.  LABORATORY DATA:  Cholesterol 135, HDL 65, LDL 60.  CK-MB and troponins were negative x3.  Hemoglobin A1c is 5.7.  Liver functions were normal. Sodium 143, potassium 3.4, BUN 8, creatinine 0.82, INR  0.98.  White count 4.1, hemoglobin 12.2, hematocrit 35.3, platelets 242.  Chest x-ray shows no acute cardiopulmonary abnormalities.  EKG shows sinus rhythm without acute changes.  DISPOSITION:  The patient was discharged in stable condition and will follow up with Dr. Alanda Amass.     Abelino Derrick, P.A.   ______________________________ Landry Corporal, MD    LKK/MEDQ  D:  10/17/2010  T:  10/18/2010  Job:  [id number]  Electronically Signed by Corine Shelter P.A. on 10/18/2010 12:12:05 PM Electronically Signed by Bryan Lemma MD on 10/19/2010 01:03:20 AM

## 2010-10-26 ENCOUNTER — Ambulatory Visit (HOSPITAL_COMMUNITY)
Admission: RE | Admit: 2010-10-26 | Discharge: 2010-10-26 | Disposition: A | Discharge status: Home / Self Care | Payer: 59 | Source: Ambulatory Visit | Attending: Gynecology | Admitting: Gynecology

## 2010-10-26 DIAGNOSIS — Z0189 Encounter for other specified special examinations: Secondary | ICD-10-CM | POA: Insufficient documentation

## 2010-10-26 DIAGNOSIS — Z139 Encounter for screening, unspecified: Secondary | ICD-10-CM

## 2010-10-31 ENCOUNTER — Other Ambulatory Visit (HOSPITAL_COMMUNITY): Payer: Self-pay | Admitting: Gynecology

## 2010-10-31 DIAGNOSIS — Z139 Encounter for screening, unspecified: Secondary | ICD-10-CM

## 2011-08-01 ENCOUNTER — Other Ambulatory Visit: Payer: Self-pay | Admitting: Gynecology

## 2012-02-25 ENCOUNTER — Emergency Department (HOSPITAL_COMMUNITY): Payer: 59

## 2012-02-25 ENCOUNTER — Emergency Department (HOSPITAL_COMMUNITY)
Admission: EM | Admit: 2012-02-25 | Discharge: 2012-02-26 | Disposition: A | Discharge status: Home / Self Care | Payer: 59 | Attending: Emergency Medicine | Admitting: Emergency Medicine

## 2012-02-25 ENCOUNTER — Encounter (HOSPITAL_COMMUNITY): Payer: Self-pay | Admitting: *Deleted

## 2012-02-25 DIAGNOSIS — Z90710 Acquired absence of both cervix and uterus: Secondary | ICD-10-CM | POA: Insufficient documentation

## 2012-02-25 DIAGNOSIS — K859 Acute pancreatitis without necrosis or infection, unspecified: Secondary | ICD-10-CM | POA: Insufficient documentation

## 2012-02-25 DIAGNOSIS — Z79899 Other long term (current) drug therapy: Secondary | ICD-10-CM | POA: Insufficient documentation

## 2012-02-25 DIAGNOSIS — Z9889 Other specified postprocedural states: Secondary | ICD-10-CM | POA: Insufficient documentation

## 2012-02-25 DIAGNOSIS — I10 Essential (primary) hypertension: Secondary | ICD-10-CM | POA: Insufficient documentation

## 2012-02-25 DIAGNOSIS — Z7982 Long term (current) use of aspirin: Secondary | ICD-10-CM | POA: Insufficient documentation

## 2012-02-25 DIAGNOSIS — I252 Old myocardial infarction: Secondary | ICD-10-CM | POA: Insufficient documentation

## 2012-02-25 LAB — CBC WITH DIFFERENTIAL/PLATELET
Eosinophils Absolute: 0.1 10*3/uL (ref 0.0–0.7)
HCT: 36.9 % (ref 36.0–46.0)
Hemoglobin: 12.7 g/dL (ref 12.0–15.0)
Lymphs Abs: 1 10*3/uL (ref 0.7–4.0)
MCH: 31.4 pg (ref 26.0–34.0)
MCHC: 34.4 g/dL (ref 30.0–36.0)
Monocytes Absolute: 0.2 10*3/uL (ref 0.1–1.0)
Monocytes Relative: 3 % (ref 3–12)
Neutro Abs: 6.7 10*3/uL (ref 1.7–7.7)
Neutrophils Relative %: 83 % — ABNORMAL HIGH (ref 43–77)
RBC: 4.05 MIL/uL (ref 3.87–5.11)

## 2012-02-25 LAB — URINALYSIS, ROUTINE W REFLEX MICROSCOPIC
Glucose, UA: NEGATIVE mg/dL
Leukocytes, UA: NEGATIVE
pH: 6 (ref 5.0–8.0)

## 2012-02-25 LAB — COMPREHENSIVE METABOLIC PANEL
Albumin: 3.8 g/dL (ref 3.5–5.2)
Alkaline Phosphatase: 85 U/L (ref 39–117)
BUN: 16 mg/dL (ref 6–23)
Potassium: 3.8 mEq/L (ref 3.5–5.1)
Total Protein: 7.5 g/dL (ref 6.0–8.3)

## 2012-02-25 LAB — LIPASE, BLOOD: Lipase: 103 U/L — ABNORMAL HIGH (ref 11–59)

## 2012-02-25 MED ORDER — IOHEXOL 300 MG/ML  SOLN
100.0000 mL | Freq: Once | INTRAMUSCULAR | Status: AC | PRN
  Administered 2012-02-25: 100 mL via INTRAVENOUS

## 2012-02-25 MED ORDER — ONDANSETRON HCL 4 MG/2ML IJ SOLN
4.0000 mg | Freq: Once | INTRAMUSCULAR | Status: AC
  Administered 2012-02-26: 4 mg via INTRAVENOUS
  Filled 2012-02-25: qty 2

## 2012-02-25 MED ORDER — SODIUM CHLORIDE 0.9 % IV BOLUS (SEPSIS)
1000.0000 mL | Freq: Once | INTRAVENOUS | Status: AC
  Administered 2012-02-25: 1000 mL via INTRAVENOUS

## 2012-02-25 MED ORDER — PROMETHAZINE HCL 25 MG PO TABS: 25.0000 mg | ORAL_TABLET | Freq: Four times a day (QID) | ORAL | Status: DC | PRN

## 2012-02-25 MED ORDER — ONDANSETRON HCL 4 MG/2ML IJ SOLN
4.0000 mg | Freq: Once | INTRAMUSCULAR | Status: AC
  Administered 2012-02-25: 4 mg via INTRAVENOUS
  Filled 2012-02-25: qty 2

## 2012-02-25 MED ORDER — OXYCODONE-ACETAMINOPHEN 5-325 MG PO TABS: 1.0000 | ORAL_TABLET | Freq: Four times a day (QID) | ORAL | Status: DC | PRN

## 2012-02-25 MED ORDER — HYDROMORPHONE HCL PF 1 MG/ML IJ SOLN
1.0000 mg | Freq: Once | INTRAMUSCULAR | Status: AC
  Administered 2012-02-25: 1 mg via INTRAVENOUS
  Filled 2012-02-25: qty 1

## 2012-02-25 MED ORDER — SODIUM CHLORIDE 0.9 % IV SOLN
INTRAVENOUS | Status: DC
  Administered 2012-02-25: 18:00:00 via INTRAVENOUS
  Administered 2012-02-25: 125 mL/h via INTRAVENOUS

## 2012-02-25 MED ORDER — HYDROMORPHONE HCL PF 1 MG/ML IJ SOLN
0.5000 mg | Freq: Once | INTRAMUSCULAR | Status: AC
  Administered 2012-02-26: 0.5 mg via INTRAVENOUS
  Filled 2012-02-25: qty 1

## 2012-02-25 NOTE — ED Notes (Signed)
Pain began today suddenly.  Took total of 3 NTG tabs w/minimal relief.  Pain originates just to right of throracic spine and radiates down to lower back and around to abdomen at epigastrum.  No tenderness in RUQ.  Negative Murphy's sign.  Vomited x 1 at home.  Vague nausea. Increased belching.  Took Protonix at 1500.

## 2012-02-25 NOTE — ED Notes (Signed)
EDP in to see pt about 10 mins ago.

## 2012-02-25 NOTE — ED Notes (Signed)
Mid right sided back pain radiating to abd that started today.  Pain worse with deep breath.  Vomited x 1 with nausea at this time. Denies cp/sob.

## 2012-02-25 NOTE — ED Provider Notes (Signed)
History  This chart was scribed for Colleen Hutching, MD by Erskine Emery, ED Scribe. This patient was seen in room APA07/APA07 and the patient's care was started at 16:54.   CSN: [id number]  Arrival date & time 02/25/12  1554   First MD Initiated Contact with Patient 02/25/12 1654      Chief Complaint  Patient presents with  . Back Pain    (Consider location/radiation/quality/duration/timing/severity/associated sxs/prior treatment) The history is provided by the patient. No language interpreter was used.  [name] is a 46 y.o. female who presents to the Emergency Department complaining of constant throbbing lower back pain that radiates all the way to her front, terminating in the epigastrium, since 2:50 this afternoon. Pt reports some associated nausea, emesis, and diaphoresis, but denies any abdominal soreness. Pt reports the pain is aggravated by taking a deep breath. Pt denies any recent activities that could have hurt her back. Prior to arrival, the pt took 1 nitroglycerine with no relief, so she took 1 more and then a 3rd one, but the pain never completely eased up. Pt has had one heart attack in February of 2006, for which she was treated in [place]. She reports her current pain is similar in amount but different in location to that episode.  Pt was recently put on a heart monitor for 2 weeks and found to have some atrial and ventral tachycardia, but was otherwise normal. At that time (6 weeks ago) her lopressor was reduced to 12.5 mg daily and her Imdur was sustained. Pt returns for a cardiac check up in January. 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 Her mother and aunt also have a h/o heart problems at a young age, her mother's being an aortic aneurism. Pt has a h/o stent placement, hysterectomy and cesarian section.  Pt does not smoke or drink. 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  Dr. Lilyan Punt  is the pt's PCP.  Past Medical History  Diagnosis Date  . MI, old   . Hypertension     Past Surgical History  Procedure Date  . Cardiac surgery     stent placement  . Abdominal hysterectomy   . Cesarean section     No family history on file.  History  Substance Use Topics  . Smoking status: Never Smoker   . Smokeless tobacco: Not on file  . Alcohol Use: No    OB History    Grav Para Term Preterm Abortions TAB SAB Ect Mult Living                  Review of Systems A complete 10 system review of systems was obtained and all systems are negative except as noted in the HPI and PMH.    Allergies  Penicillins  Home Medications   Current Outpatient Rx  Name  Route  Sig  Dispense  Refill  . ASPIRIN EC 81 MG PO TBEC   Oral   Take 162 mg by mouth daily.         Marland Kitchen CLOPIDOGREL BISULFATE 75 MG PO TABS   Oral   Take 75 mg by mouth daily.         Marland Kitchen METOPROLOL TARTRATE 25 MG PO TABS   Oral   Take 12.5 mg by mouth daily.         Marland Kitchen PANTOPRAZOLE SODIUM 40 MG PO TBEC   Oral  Take 40 mg by mouth daily.         Marland Kitchen ROSUVASTATIN CALCIUM 20 MG PO TABS   Oral   Take 20 mg by mouth daily.           Triage Vitals: BP 130/76  Pulse 55  Temp 97.4 F (36.3 C) (Oral)  Resp 20  Ht 5\' 2"  (1.575 m)  Wt 180 lb (81.647 kg)  BMI 32.92 kg/m2  SpO2 100%  Physical Exam  Nursing note and vitals reviewed. Constitutional: She is oriented to person, place, and time. She appears well-developed and well-nourished.  HENT:  Head: Normocephalic and atraumatic.  Eyes: Conjunctivae normal and EOM are normal. Pupils are equal, round, and reactive to light.  Neck: Normal range of motion. Neck supple.  Cardiovascular: Normal rate, regular rhythm and normal heart sounds.   Pulmonary/Chest: Effort normal and breath sounds normal.  Abdominal: Soft. Bowel sounds are normal.  Musculoskeletal: Normal range of motion.       Tenderness around T10.  Neurological: She is alert and  oriented to person, place, and time.  Skin: Skin is warm and dry.  Psychiatric: She has a normal mood and affect.    ED Course  Procedures (including critical care time) DIAGNOSTIC STUDIES: Oxygen Saturation is 100% on room air, normal by my interpretation.    COORDINATION OF CARE: 17:15--I evaluated the pt and we discussed a treatment plan including IV fluids, blood work, urinalysis, abdominal and chest x-rays, pain medication (Dilaudid), and nausea medication (Zofran), to which the pt agreed.  20:00--I rechecked the pt and notified her that her labs show she has a high lipase level, which suggests either pancreas or gall bladder issues. I told the pt we would send her in for a CT scan for further testing.  Results for orders placed during the hospital encounter of 02/25/12  CBC WITH DIFFERENTIAL      Component Value Range   WBC 8.1  4.0 - 10.5 K/uL   RBC 4.05  3.87 - 5.11 MIL/uL   Hemoglobin 12.7  12.0 - 15.0 g/dL   HCT 04.5  40.9 - 81.1 %   MCV 91.1  78.0 - 100.0 fL   MCH 31.4  26.0 - 34.0 pg   MCHC 34.4  30.0 - 36.0 g/dL   RDW 91.4  78.2 - 95.6 %   Platelets 282  150 - 400 K/uL   Neutrophils Relative 83 (*) 43 - 77 %   Neutro Abs 6.7  1.7 - 7.7 K/uL   Lymphocytes Relative 13  12 - 46 %   Lymphs Abs 1.0  0.7 - 4.0 K/uL   Monocytes Relative 3  3 - 12 %   Monocytes Absolute 0.2  0.1 - 1.0 K/uL   Eosinophils Relative 1  0 - 5 %   Eosinophils Absolute 0.1  0.0 - 0.7 K/uL   Basophils Relative 1  0 - 1 %   Basophils Absolute 0.0  0.0 - 0.1 K/uL  COMPREHENSIVE METABOLIC PANEL      Component Value Range   Sodium 141  135 - 145 mEq/L   Potassium 3.8  3.5 - 5.1 mEq/L   Chloride 105  96 - 112 mEq/L   CO2 27  19 - 32 mEq/L   Glucose, Bld 138 (*) 70 - 99 mg/dL   BUN 16  6 - 23 mg/dL   Creatinine, Ser 2.13  0.50 - 1.10 mg/dL   Calcium 9.4  8.4 - 08.6 mg/dL   Total Protein  7.5  6.0 - 8.3 g/dL   Albumin 3.8  3.5 - 5.2 g/dL   AST 11  0 - 37 U/L   ALT 6  0 - 35 U/L   Alkaline  Phosphatase 85  39 - 117 U/L   Total Bilirubin 0.2 (*) 0.3 - 1.2 mg/dL   GFR calc non Af Amer 71 (*) >90 mL/min   GFR calc Af Amer 82 (*) >90 mL/min  URINALYSIS, ROUTINE W REFLEX MICROSCOPIC      Component Value Range   Color, Urine YELLOW  YELLOW   APPearance CLEAR  CLEAR   Specific Gravity, Urine >1.030 (*) 1.005 - 1.030   pH 6.0  5.0 - 8.0   Glucose, UA NEGATIVE  NEGATIVE mg/dL   Hgb urine dipstick NEGATIVE  NEGATIVE   Bilirubin Urine NEGATIVE  NEGATIVE   Ketones, ur 15 (*) NEGATIVE mg/dL   Protein, ur NEGATIVE  NEGATIVE mg/dL   Urobilinogen, UA 0.2  0.0 - 1.0 mg/dL   Nitrite NEGATIVE  NEGATIVE   Leukocytes, UA NEGATIVE  NEGATIVE  LIPASE, BLOOD      Component Value Range   Lipase 103 (*) 11 - 59 U/L   Ct Abdomen Pelvis W Contrast  02/25/2012  *RADIOLOGY REPORT*  Clinical Data: Elevated lipase.  Back pain.Nausea and vomiting.  CT ABDOMEN AND PELVIS WITH CONTRAST  Technique:  Multidetector CT imaging of the abdomen and pelvis was performed following the standard protocol during bolus administration of intravenous contrast.  Contrast: OMNIPAQUE IOHEXOL 300 MG/ML  SOLN  Comparison: CT of the abdomen and pelvis 11/14/2001.  Findings:  Lung Bases: Coronary artery stents in the distal right coronary artery.  Abdomen/Pelvis:  The pancreas is normal in appearance. Specifically, the pancreatic parenchyma enhances normally, there are no definite peripancreatic inflammatory changes and no peripancreatic fluid collections.  The enhanced appearance of the liver, gallbladder, spleen, bilateral adrenal glands and the right kidney is unremarkable.  In the lower pole of the left kidney there is a subcentimeter low attenuation lesion which is too small to definitively characterize but is similar in retrospect compared to remote prior study from 10 years ago, most compatible with a small cyst.  No ascites or pneumoperitoneum and no pathologic distension of small bowel.  No definite pathologic  lymphadenopathy identified within the abdomen or pelvis.  Status post hysterectomy.  Ovaries are not confidently identified and may either be surgically absent or atrophic. Urinary bladder is unremarkable in appearance.  Musculoskeletal: There are no aggressive appearing lytic or blastic lesions noted in the visualized portions of the skeleton.  IMPRESSION: 1.  No acute findings in the abdomen or pelvis. 2.  Specifically, the pancreas is morphologically normal in appearance at this time. 3.  Additional incidental findings, as above.   Original Report Authenticated By: Trudie Reed, M.D.    Dg Abd Acute W/chest  02/25/2012  *RADIOLOGY REPORT*  Clinical Data: Epigastric pain.  Back pain.  ACUTE ABDOMEN SERIES (ABDOMEN 2 VIEW & CHEST 1 VIEW)  Comparison: Chest 10/30/2008.  Findings: Lungs clear.  Heart size normal.  No pneumothorax or pleural fluid.  Two views of the abdomen show no free intraperitoneal air.  There is no evidence of bowel obstruction.  No unexpected abdominal calcification or focal bony abnormality.  IMPRESSION: No acute finding chest or abdomen.   Original Report Authenticated By: Holley Dexter, M.D.       No diagnosis found.   Date: 02/25/2012  Rate: 54  Rhythm: sinus bradycardia  QRS Axis: normal  Intervals: normal  ST/T Wave abnormalities: normal  Conduction Disutrbances:none  Narrative Interpretation:   Old EKG Reviewed: changes noted   MDM  No acute abdomen. CT scan shows no obvious etiology for mild pancreatitis. Findings discussed with patient and her husband. Discharge home on Percocet #20 and Phenergan 25 mg #20      I personally performed the services described in this documentation, which was scribed in my presence. The recorded information has been reviewed and is accurate.   Colleen Hutching, MD 02/25/12 [phone]

## 2012-05-18 ENCOUNTER — Other Ambulatory Visit: Payer: Self-pay

## 2012-07-29 ENCOUNTER — Other Ambulatory Visit (HOSPITAL_COMMUNITY): Payer: Self-pay | Admitting: Cardiovascular Disease

## 2012-07-29 DIAGNOSIS — R011 Cardiac murmur, unspecified: Secondary | ICD-10-CM

## 2012-07-30 ENCOUNTER — Ambulatory Visit (HOSPITAL_COMMUNITY)
Admission: RE | Admit: 2012-07-30 | Discharge: 2012-07-30 | Disposition: A | Discharge status: Home / Self Care | Payer: 59 | Source: Ambulatory Visit | Attending: Cardiovascular Disease | Admitting: Cardiovascular Disease

## 2012-07-30 DIAGNOSIS — R011 Cardiac murmur, unspecified: Secondary | ICD-10-CM | POA: Insufficient documentation

## 2012-07-30 HISTORY — PX: TRANSTHORACIC ECHOCARDIOGRAM: SHX275

## 2012-07-30 NOTE — Progress Notes (Signed)
2D Echo Performed 07/30/2012    Nakima Fluegge, RCS  

## 2012-08-15 ENCOUNTER — Other Ambulatory Visit: Payer: Self-pay | Admitting: Cardiovascular Disease

## 2012-08-15 LAB — COMPREHENSIVE METABOLIC PANEL
ALT: 10 U/L (ref 0–35)
AST: 13 U/L (ref 0–37)
Albumin: 4.3 g/dL (ref 3.5–5.2)
Calcium: 9.4 mg/dL (ref 8.4–10.5)
Chloride: 107 mEq/L (ref 96–112)
Potassium: 4.1 mEq/L (ref 3.5–5.3)
Total Protein: 7 g/dL (ref 6.0–8.3)

## 2012-09-20 ENCOUNTER — Telehealth: Payer: Self-pay | Admitting: Cardiovascular Disease

## 2012-09-20 NOTE — Telephone Encounter (Signed)
Forwarded to [facility] for Dr. Kandis Cocking review.

## 2012-09-20 NOTE — Telephone Encounter (Signed)
BEEN TAKING CRESTOR FOR 4 WEEKS-HURTING ALL OVER-SHE IS GOING TO STOP TAKING IT AGAIN! WHAT DOES HE SUGGEST SHE TAKE NOW?

## 2012-09-27 ENCOUNTER — Encounter: Payer: Self-pay | Admitting: *Deleted

## 2012-09-27 ENCOUNTER — Encounter: Payer: Self-pay | Admitting: Physician Assistant

## 2012-09-27 ENCOUNTER — Encounter (HOSPITAL_COMMUNITY): Payer: Self-pay | Admitting: Cardiovascular Disease

## 2012-09-27 ENCOUNTER — Ambulatory Visit (INDEPENDENT_AMBULATORY_CARE_PROVIDER_SITE_OTHER): Payer: 59 | Admitting: Physician Assistant

## 2012-09-27 VITALS — BP 108/84 | HR 75 | Ht 61.0 in | Wt 189.4 lb

## 2012-09-27 DIAGNOSIS — E785 Hyperlipidemia, unspecified: Secondary | ICD-10-CM

## 2012-09-27 DIAGNOSIS — R079 Chest pain, unspecified: Secondary | ICD-10-CM

## 2012-09-27 DIAGNOSIS — E782 Mixed hyperlipidemia: Secondary | ICD-10-CM | POA: Insufficient documentation

## 2012-09-27 DIAGNOSIS — E669 Obesity, unspecified: Secondary | ICD-10-CM

## 2012-09-27 DIAGNOSIS — R5383 Other fatigue: Secondary | ICD-10-CM | POA: Insufficient documentation

## 2012-09-27 DIAGNOSIS — I2510 Atherosclerotic heart disease of native coronary artery without angina pectoris: Secondary | ICD-10-CM

## 2012-09-27 DIAGNOSIS — R5381 Other malaise: Secondary | ICD-10-CM

## 2012-09-27 LAB — CBC
HCT: 37.8 % (ref 36.0–46.0)
MCV: 87.5 fL (ref 78.0–100.0)
Platelets: 314 10*3/uL (ref 150–400)
RBC: 4.32 MIL/uL (ref 3.87–5.11)
WBC: 4.2 10*3/uL (ref 4.0–10.5)

## 2012-09-27 NOTE — Assessment & Plan Note (Signed)
Last lipid panel in May 2014 looked very good other than the LDL elevated at 122. Patient was started back on Crestor however she was boarded muscle aches and has since stopped taking it. She does take red yeast rice.

## 2012-09-27 NOTE — Assessment & Plan Note (Signed)
Patient was scheduled for a treadmill Myoview. I instructed her to restart taking Imdur 30 mg daily she notices a continued episodes of chest pain without increased intensity or frequency. If the intensity does increase along with the frequency she is to report to the emergency room.

## 2012-09-27 NOTE — Patient Instructions (Signed)
We'll have you check a TSH, CBC the lab this morning. We schedule for treadmill stress test at her next available appointment. If you notice an increase in intensity and frequency of chest pain to the emergency room. The intensity and frequency do not increase but you're still having intermittent chest pain, start taking Imdur 30 mg daily.

## 2012-09-27 NOTE — Progress Notes (Signed)
Date:  09/27/2012   ID:  Nhung, Colleen Dillon 09-27-1965, MRN [id number]  PCP:  Colleen Lance, MD  Primary Cardiologist:  Alanda Amass    History of Present Illness: Colleen Dillon is a 47 y.o. female history of obesity, coronary artery disease, hyperlipidemia, hypertension. Patient had acute DMI in 2006 with single vessel RCA disease with placement of overlapping bare metal stents. A recatheterization for repeat angina the same day and required subsequent DES stenting of the distal RCA and PLA branches peeling occluded at that time. Last catheterization was in 2010 showed 30-50% narrowing of the acute margin. The overlapping DES stents and IVUS interrogation showed good residual lumen. His chronic PDA occlusion with collaterals from the left. She ruled out for MI in July 2012 last nuclear stress test was 10/17/2010 showed no definite inducible or reversible ischemia the EF of 58%.  Last echo was April 2014 showed ejection fraction of 50-55% with normal wall motion. Is moderate mitral valve regurgitation. Valve area was 1.25 cm.  Currently works as a Engineer, civil (consulting) at [employer] patient reports that for last couple weeks she's been very tired with no energy. She comes home from work in 3 months was removed very limited activity. She does report chest pain which started today with radiation to her back. She states it is a squeezing sensation which is very quick to resolve. She has used a sublingual nitroglycerin which resolves it. She reports waking up sick and nauseated recently without any pain at that time. She has had increased weight gain in last couple weeks which she attributes to inactivity after work. The patient currently denies nausea, vomiting, fever, chest pain, shortness of breath, orthopnea, dizziness, PND, cough, congestion, abdominal pain, hematochezia, melena, lower extremity edema, claudication.    Wt Readings from Last 3 Encounters:  09/27/12 189 lb 6.4 oz (85.911 kg)  02/25/12 180  lb (81.647 kg)     Past Medical History  Diagnosis Date  . Old MI (myocardial infarction) 2006  . Hypertension   . CAD (coronary artery disease)   . Hyperlipidemia   . Obesity     Current Outpatient Prescriptions  Medication Sig Dispense Refill  . aspirin EC 81 MG tablet Take 162 mg by mouth daily.      . clopidogrel (PLAVIX) 75 MG tablet Take 75 mg by mouth daily.      . metoprolol tartrate (LOPRESSOR) 25 MG tablet Take 12.5 mg by mouth daily.      Marland Kitchen oxyCODONE-acetaminophen (PERCOCET/ROXICET) 5-325 MG per tablet Take 1-2 tablets by mouth every 6 (six) hours as needed for pain.  20 tablet  0  . pantoprazole (PROTONIX) 40 MG tablet Take 40 mg by mouth daily.      . promethazine (PHENERGAN) 25 MG tablet Take 1 tablet (25 mg total) by mouth every 6 (six) hours as needed for nausea.  20 tablet  0  . Red Yeast Rice Extract (RED YEAST RICE PO) Take by mouth daily. Take 2 daily       No current facility-administered medications for this visit.    Allergies:    Allergies  Allergen Reactions  . Crestor (Rosuvastatin)     Myalgia   . Niaspan (Niacin Er) Itching  . Penicillins Other (See Comments)    Child hood allergy    Social History:  The patient  reports that she has quit smoking. She quit smokeless tobacco use about 25 years ago. She reports that she does not drink alcohol or use illicit  drugs.    Family history: No family history of hypothyroidism  ROS:  Please see the history of present illness.  All other systems reviewed and negative.   PHYSICAL EXAM: VS:  BP 108/84  Pulse 75  Ht 5\' 1"  (1.549 m)  Wt 189 lb 6.4 oz (85.911 kg)  BMI 35.81 kg/m2 Well nourished, well developed, in no acute distress HEENT: Pupils are equal round react to light accommodation extraocular movements are intact.  Neck: no JVDNo cervical lymphadenopathy. Cardiac: Regular rate and rhythm without murmurs rubs or gallops. Lungs:  clear to auscultation bilaterally, no wheezing, rhonchi or  rales Abd: soft, nontender, positive bowel sounds all quadrants, no hepatosplenomegaly Ext: no lower extremity edema.  2+ radial and dorsalis pedis pulses. Skin: warm and dry Neuro:  Grossly normal, strength 5 out of 5 equal in upper and lower extremities  EKG:  Normal sinus rhythm rate 75 beats per minute no acute changes to indicate ischemia she does have Q waves inferiorly    ASSESSMENT AND PLAN:  Problem List Items Addressed This Visit   Chest pain - Primary     Patient was scheduled for a treadmill Myoview. I instructed her to restart taking Imdur 30 mg daily she notices a continued episodes of chest pain without increased intensity or frequency. If the intensity does increase along with the frequency she is to report to the emergency room.    Relevant Orders      EKG 12-Lead      Myocardial Perfusion Imaging   Coronary artery disease:   Fatigue     We'll check a TSH and CBC to look at her hemoglobin as a cause for her fatigue. No family history of hypothyroidism her she reports a recent weight gain which may just be related to inactivity.    Relevant Orders      CBC      TSH   Hyperlipidemia     Last lipid panel in May 2014 looked very good other than the LDL elevated at 122. Patient was started back on Crestor however she was boarded muscle aches and has since stopped taking it. She does take red yeast rice.    Obesity (BMI 35.0-39.9 without comorbidity)

## 2012-09-27 NOTE — Assessment & Plan Note (Signed)
We'll check a TSH and CBC to look at her hemoglobin as a cause for her fatigue. No family history of hypothyroidism her she reports a recent weight gain which may just be related to inactivity.

## 2012-10-02 ENCOUNTER — Ambulatory Visit (HOSPITAL_COMMUNITY)
Admission: RE | Admit: 2012-10-02 | Discharge: 2012-10-02 | Disposition: A | Discharge status: Home / Self Care | Payer: 59 | Source: Ambulatory Visit | Attending: Internal Medicine | Admitting: Internal Medicine

## 2012-10-02 DIAGNOSIS — R079 Chest pain, unspecified: Secondary | ICD-10-CM | POA: Insufficient documentation

## 2012-10-02 DIAGNOSIS — R5381 Other malaise: Secondary | ICD-10-CM | POA: Insufficient documentation

## 2012-10-02 DIAGNOSIS — R5383 Other fatigue: Secondary | ICD-10-CM | POA: Insufficient documentation

## 2012-10-02 DIAGNOSIS — E669 Obesity, unspecified: Secondary | ICD-10-CM | POA: Insufficient documentation

## 2012-10-02 DIAGNOSIS — I10 Essential (primary) hypertension: Secondary | ICD-10-CM | POA: Insufficient documentation

## 2012-10-02 DIAGNOSIS — Z87891 Personal history of nicotine dependence: Secondary | ICD-10-CM | POA: Insufficient documentation

## 2012-10-02 DIAGNOSIS — R0602 Shortness of breath: Secondary | ICD-10-CM | POA: Insufficient documentation

## 2012-10-02 HISTORY — PX: CARDIOVASCULAR STRESS TEST: SHX262

## 2012-10-02 MED ORDER — TECHNETIUM TC 99M SESTAMIBI GENERIC - CARDIOLITE
10.0000 | Freq: Once | INTRAVENOUS | Status: AC | PRN
  Administered 2012-10-02: 10 via INTRAVENOUS

## 2012-10-02 MED ORDER — TECHNETIUM TC 99M SESTAMIBI GENERIC - CARDIOLITE
30.0000 | Freq: Once | INTRAVENOUS | Status: AC | PRN
  Administered 2012-10-02: 30 via INTRAVENOUS

## 2012-10-02 NOTE — Procedures (Addendum)
[facility] [address] [place] [address] [place] [address] [phone]  Cardiology Nuclear Med Study  Colleen Dillon is a 47 y.o. female     MRN : [id number]     DOB: Aug 19, 1965  Procedure Date: 10/02/2012  Nuclear Med Background Indication for Stress Test:  Stent Patency History:  MI 2006, Stent 2006 Cardiac Risk Factors: History of Smoking, Hypertension, Lipids and Obesity  Symptoms:  Chest Pain, Fatigue and SOB   Nuclear Pre-Procedure Caffeine/Decaff Intake:  1:00am NPO After: 11:00am   IV Site: R Antecubital  IV 0.9% NS with Angio Cath:  22g  Chest Size (in):  n/a IV Started by: Koren Shiver, CNMT  Height: 5\' 1"  (1.549 m)  Cup Size: 40G  BMI:  Body mass index is 35.73 kg/(m^2). Weight:  189 lb (85.73 kg)   Tech Comments: held lopressor 24 hrs prior to test    Nuclear Med Study 1 or 2 day study: 1 day  Stress Test Type:  Stress  Order Authorizing Provider:  Susa Griffins, MD   Resting Radionuclide: Technetium 75m Sestamibi  Resting Radionuclide Dose: 10.6 mCi   Stress Radionuclide:  Technetium 40m Sestamibi  Stress Radionuclide Dose: 30.5 mCi           Stress Protocol Rest HR: 74 Stress HR: 166  Rest BP: 100/78 Stress BP:137/52  Exercise Time (min): 9:43 METS: 11.20          Dose of Adenosine (mg):  n/a Dose of Lexiscan: n/a mg  Dose of Atropine (mg): n/a Dose of Dobutamine: n/a mcg/kg/min (at max HR)  Stress Test Technologist: Ernestene Mention, CCT Nuclear Technologist: Koren Shiver, CNMT   Rest Procedure:  Myocardial perfusion imaging was performed at rest 45 minutes following the intravenous administration of Technetium 4m Sestamibi. Stress Procedure:  The patient performed treadmill exercise using a Bruce  Protocol for 9 minutes and 43 seconds. The patient stopped due to fatigue. Patient denied any chest pain.  There were no significant ST-T wave changes.  Technetium 13m Sestamibi was injected at peak exercise  and myocardial perfusion imaging was performed after a brief delay.  Transient Ischemic Dilatation (Normal <1.22):  1.01 Lung/Heart Ratio (Normal <0.45):  0.44 QGS EDV:  79 ml QGS ESV:  29 ml LV Ejection Fraction: 63%  Signed by Koren Shiver, CNMT  PHYSICIAN INTERPRETATION  Rest ECG: NSR with non-specific ST-T wave changes, RSR'  Stress ECG: No significant change from baseline ECG, No significant ST segment change suggestive of ischemia. and Insignificant upsloping ST segment depression.  QPS Raw Data Images:  Mild breast attenuation.  Normal left ventricular size. There is interference from nuclear activity from structures below the diaphragm. This does not affect the ability to read the study.  Stress Images:  There is mild apical thinning with normal uptake in other regions. Rest Images:  There is decreased uptake in the apex.   Noted in rest & not stress images. Subtraction (SDS):  There is no evidence of scar or ischemia.  Impression Exercise Capacity:  Excellent exercise capacity. 11.2 METs with excellent recovery BP Response:  Normal blood pressure response. Clinical Symptoms:  fatigue ECG Impression:  No significant ST segment change suggestive of ischemia. Comparison with Prior Nuclear Study: No images to compare  Overall Impression:  Normal stress nuclear study. and Low risk stress nuclear study with no evidence of ischemia or infarction..  LV Wall Motion:  NL LV Function; NL Wall Motion   [name], MD  10/02/2012 5:29 PM

## 2012-10-19 ENCOUNTER — Encounter: Payer: Self-pay | Admitting: Cardiovascular Disease

## 2013-01-22 ENCOUNTER — Other Ambulatory Visit: Payer: Self-pay | Admitting: *Deleted

## 2013-01-22 MED ORDER — METOPROLOL TARTRATE 25 MG PO TABS: 12.5000 mg | ORAL_TABLET | Freq: Every day | ORAL | Status: DC

## 2013-01-22 NOTE — Telephone Encounter (Signed)
Rx was sent to pharmacy electronically. 

## 2013-02-06 ENCOUNTER — Other Ambulatory Visit: Payer: Self-pay

## 2013-02-26 ENCOUNTER — Ambulatory Visit: Payer: 59 | Admitting: Internal Medicine

## 2013-03-07 ENCOUNTER — Ambulatory Visit: Payer: 59 | Admitting: Internal Medicine

## 2013-03-12 ENCOUNTER — Ambulatory Visit: Payer: 59 | Admitting: Internal Medicine

## 2013-04-02 ENCOUNTER — Encounter: Payer: Self-pay | Admitting: Internal Medicine

## 2013-04-02 ENCOUNTER — Ambulatory Visit (INDEPENDENT_AMBULATORY_CARE_PROVIDER_SITE_OTHER): Payer: 59 | Admitting: Internal Medicine

## 2013-04-02 VITALS — BP 128/78 | HR 81 | Ht 61.0 in | Wt 198.6 lb

## 2013-04-02 DIAGNOSIS — E669 Obesity, unspecified: Secondary | ICD-10-CM

## 2013-04-02 DIAGNOSIS — E785 Hyperlipidemia, unspecified: Secondary | ICD-10-CM

## 2013-04-02 DIAGNOSIS — Z79899 Other long term (current) drug therapy: Secondary | ICD-10-CM

## 2013-04-02 DIAGNOSIS — R5381 Other malaise: Secondary | ICD-10-CM

## 2013-04-02 DIAGNOSIS — I2510 Atherosclerotic heart disease of native coronary artery without angina pectoris: Secondary | ICD-10-CM

## 2013-04-02 DIAGNOSIS — E782 Mixed hyperlipidemia: Secondary | ICD-10-CM

## 2013-04-02 MED ORDER — PITAVASTATIN CALCIUM 2 MG PO TABS: 2.0000 mg | ORAL_TABLET | Freq: Every day | ORAL | Status: DC

## 2013-04-02 NOTE — Progress Notes (Signed)
Date:  04/02/2013   ID:  Colleen Dillon 30-Nov-1965, MRN [id number]  PCP:  Thora Lance, MD  Primary Cardiologist:  Formerly Dr. Alanda Amass - new patient to me    History of Present Illness: Colleen Dillon is a 47 y.o. female history of obesity, coronary artery disease, hyperlipidemia, hypertension, formerly followed by Dr. Alanda Amass - here to establish care with me today. Patient had acute DMI in 2006 with single vessel RCA disease with placement of overlapping bare metal stents. A recatheterization for repeat angina the same day and required subsequent DES stenting of the distal RCA and PLA branches peeling occluded at that time. Last catheterization was in 2010 showed 30-50% narrowing of the acute margin. The overlapping DES stents and IVUS interrogation showed good residual lumen. His chronic PDA occlusion with collaterals from the left. She ruled out for MI in July 2012 last nuclear stress test was 10/17/2010 showed no definite inducible or reversible ischemia the EF of 58%.  Last echo was April 2014 showed ejection fraction of 50-55% with normal wall motion. Is moderate mitral valve regurgitation. Currently works as a Engineer, civil (consulting) at [employer].   She last saw Wilburt Finlay, PA-C for symptoms of fatigue and chest pain this past summer and underwent a nuclear stress test on 10/02/2012 which was negative for ischemia. She exercised to 11.2 metabolic equivalents and had no chest pain. Overall she has done well and recently had a lipid profile which demonstrated LDL cholesterol of 122.  She was previously on Crestor 10 mg daily but was intolerant of this due to side effects including myalgias. She is also previously failed Zocor and lovastatin.  Wt Readings from Last 3 Encounters:  04/02/13 198 lb 9.6 oz (90.084 kg)  10/02/12 189 lb (85.73 kg)  09/27/12 189 lb 6.4 oz (85.911 kg)     Past Medical History  Diagnosis Date  . Old MI (myocardial infarction) 2006  . Hypertension   . CAD  (coronary artery disease)   . Hyperlipidemia   . Obesity   . Cardiac murmur   . History of smoking   . Chest pain   . Fatigue   . SOB (shortness of breath)     Current Outpatient Prescriptions  Medication Sig Dispense Refill  . aspirin EC 81 MG tablet Take 162 mg by mouth daily.      . clopidogrel (PLAVIX) 75 MG tablet Take 75 mg by mouth daily.      . metoprolol tartrate (LOPRESSOR) 25 MG tablet Take 0.5 tablets (12.5 mg total) by mouth daily.  15 tablet  9  . oxyCODONE-acetaminophen (PERCOCET/ROXICET) 5-325 MG per tablet Take 1-2 tablets by mouth every 6 (six) hours as needed for pain.  20 tablet  0  . pantoprazole (PROTONIX) 40 MG tablet Take 40 mg by mouth daily.      . promethazine (PHENERGAN) 25 MG tablet Take 1 tablet (25 mg total) by mouth every 6 (six) hours as needed for nausea.  20 tablet  0  . Pitavastatin Calcium (LIVALO) 2 MG TABS Take 1 tablet (2 mg total) by mouth daily.  30 tablet  5   No current facility-administered medications for this visit.    Allergies:    Allergies  Allergen Reactions  . Crestor [Rosuvastatin]     Myalgia   . Niaspan [Niacin Er] Itching  . Penicillins Other (See Comments)    Child hood allergy       Social History:  The patient  reports  that she has quit smoking. She quit smokeless tobacco use about 26 years ago. She reports that she does not drink alcohol or use illicit drugs.    Family history: No family history of hypothyroidism  ROS:  Please see the history of present illness.  All other systems reviewed and negative.   PHYSICAL EXAM: VS:  BP 128/78  Pulse 81  Ht 5\' 1"  (1.549 m)  Wt 198 lb 9.6 oz (90.084 kg)  BMI 37.54 kg/m2 Well nourished, well developed, in no acute distress HEENT: Pupils are equal round react to light accommodation extraocular movements are intact.  Neck: no JVDNo cervical lymphadenopathy. Cardiac: Regular rate and rhythm without murmurs rubs or gallops. Lungs:  clear to auscultation bilaterally, no  wheezing, rhonchi or rales Abd: soft, nontender, positive bowel sounds all quadrants, no hepatosplenomegaly Ext: no lower extremity edema.  2+ radial and dorsalis pedis pulses. Skin: warm and dry Neuro:  Grossly normal, strength 5 out of 5 equal in upper and lower extremities  EKG:  Normal sinus rhythm 81  ASSESSMENT AND PLAN:  Problem List Items Addressed This Visit   Obesity (BMI 35.0-39.9 without comorbidity)   Coronary artery disease: - Primary   Relevant Medications      Pitavastatin Calcium (LIVALO) 2 MG TABS   Other Relevant Orders      EKG 12-Lead   Hyperlipidemia    Other Visit Diagnoses   Mixed hyperlipidemia        Relevant Orders       NMR Lipoprofile with Lipids    Encounter for long-term (current) use of other medications        Relevant Orders       Comprehensive metabolic panel    Other malaise and fatigue        Relevant Orders       TSH      PLAN: 1.  Colleen Dillon had recent low risk stress test and echocardiogram which showed only mild valvular disease. She does have a history of extensive stenting in the right coronary artery with collaterals to the PDA.  There is clear indication for cholesterol medication, however she has had problems including myalgias with 3 different statins. I agree with Dr. Kandis Cocking recommendations in the past that she would be a candidate for Livalo. Although had a prescriber for 2 mg daily and plan to recheck her cholesterol and CMP in 3 months.  [name], MD, [facility] Attending Cardiologist [facility]

## 2013-04-02 NOTE — Patient Instructions (Signed)
START Livalo 2mg  daily.  Your physician recommends that you return for lab work in: 3 months - April 2015  Your physician recommends that you schedule a follow-up appointment in:  6 months.

## 2013-06-17 ENCOUNTER — Encounter: Payer: Self-pay | Admitting: *Deleted

## 2013-06-24 LAB — NMR LIPOPROFILE WITH LIPIDS
Cholesterol, Total: 141 mg/dL (ref <200)
HDL Particle Number: 40.9 umol/L (ref >=30.5)
HDL Size: 9.3 nm (ref >=9.2)
HDL-C: 55 mg/dL (ref >=40)
LDL (calc): 71 mg/dL (ref <100)
LDL Particle Number: 774 nmol/L (ref <1000)
LDL Size: 20.7 nm (ref >20.5)
LP-IR Score: 42 (ref <=45)
Large HDL-P: 7.6 umol/L (ref >=4.8)
Large VLDL-P: 2.3 nmol/L (ref <=2.7)
Small LDL Particle Number: 460 nmol/L (ref <=527)
Triglycerides: 77 mg/dL (ref <150)
VLDL Size: 46.5 nm (ref <=46.6)

## 2013-06-24 LAB — TSH: TSH: 2.86 u[IU]/mL (ref 0.350–4.500)

## 2013-08-11 ENCOUNTER — Encounter (HOSPITAL_COMMUNITY): Payer: Self-pay | Admitting: Emergency Medicine

## 2013-08-11 ENCOUNTER — Emergency Department (HOSPITAL_COMMUNITY)
Admission: EM | Admit: 2013-08-11 | Discharge: 2013-08-11 | Disposition: A | Discharge status: Home / Self Care | Payer: 59 | Attending: Emergency Medicine | Admitting: Emergency Medicine

## 2013-08-11 ENCOUNTER — Emergency Department (HOSPITAL_COMMUNITY): Payer: 59

## 2013-08-11 DIAGNOSIS — Z880 Allergy status to penicillin: Secondary | ICD-10-CM | POA: Insufficient documentation

## 2013-08-11 DIAGNOSIS — Z9889 Other specified postprocedural states: Secondary | ICD-10-CM | POA: Insufficient documentation

## 2013-08-11 DIAGNOSIS — Z862 Personal history of diseases of the blood and blood-forming organs and certain disorders involving the immune mechanism: Secondary | ICD-10-CM | POA: Insufficient documentation

## 2013-08-11 DIAGNOSIS — Z7901 Long term (current) use of anticoagulants: Secondary | ICD-10-CM | POA: Insufficient documentation

## 2013-08-11 DIAGNOSIS — Z7982 Long term (current) use of aspirin: Secondary | ICD-10-CM | POA: Insufficient documentation

## 2013-08-11 DIAGNOSIS — I2510 Atherosclerotic heart disease of native coronary artery without angina pectoris: Secondary | ICD-10-CM | POA: Insufficient documentation

## 2013-08-11 DIAGNOSIS — Z3202 Encounter for pregnancy test, result negative: Secondary | ICD-10-CM | POA: Insufficient documentation

## 2013-08-11 DIAGNOSIS — E669 Obesity, unspecified: Secondary | ICD-10-CM | POA: Insufficient documentation

## 2013-08-11 DIAGNOSIS — I10 Essential (primary) hypertension: Secondary | ICD-10-CM | POA: Insufficient documentation

## 2013-08-11 DIAGNOSIS — R079 Chest pain, unspecified: Secondary | ICD-10-CM | POA: Insufficient documentation

## 2013-08-11 DIAGNOSIS — Z8639 Personal history of other endocrine, nutritional and metabolic disease: Secondary | ICD-10-CM | POA: Insufficient documentation

## 2013-08-11 DIAGNOSIS — I252 Old myocardial infarction: Secondary | ICD-10-CM | POA: Insufficient documentation

## 2013-08-11 DIAGNOSIS — R011 Cardiac murmur, unspecified: Secondary | ICD-10-CM | POA: Insufficient documentation

## 2013-08-11 DIAGNOSIS — Z87891 Personal history of nicotine dependence: Secondary | ICD-10-CM | POA: Insufficient documentation

## 2013-08-11 DIAGNOSIS — R5381 Other malaise: Secondary | ICD-10-CM | POA: Insufficient documentation

## 2013-08-11 DIAGNOSIS — R5383 Other fatigue: Secondary | ICD-10-CM

## 2013-08-11 LAB — BASIC METABOLIC PANEL
BUN: 13 mg/dL (ref 6–23)
CHLORIDE: 106 meq/L (ref 96–112)
CO2: 26 meq/L (ref 19–32)
CREATININE: 0.82 mg/dL (ref 0.50–1.10)
Calcium: 9.3 mg/dL (ref 8.4–10.5)
GFR calc Af Amer: >90 mL/min (ref >90)
GFR calc non Af Amer: 82 mL/min — ABNORMAL LOW (ref >90)
Glucose, Bld: 94 mg/dL (ref 70–99)
Potassium: 4.1 mEq/L (ref 3.7–5.3)
SODIUM: 143 meq/L (ref 137–147)

## 2013-08-11 LAB — TROPONIN I: Troponin I: <0.3 ng/mL (ref <0.30)

## 2013-08-11 LAB — CBC
HCT: 35.9 % — ABNORMAL LOW (ref 36.0–46.0)
Hemoglobin: 11.9 g/dL — ABNORMAL LOW (ref 12.0–15.0)
MCH: 30 pg (ref 26.0–34.0)
MCHC: 33.1 g/dL (ref 30.0–36.0)
MCV: 90.4 fL (ref 78.0–100.0)
PLATELETS: 269 10*3/uL (ref 150–400)
RBC: 3.97 MIL/uL (ref 3.87–5.11)
RDW: 12.6 % (ref 11.5–15.5)
WBC: 3.6 10*3/uL — AB (ref 4.0–10.5)

## 2013-08-11 LAB — I-STAT TROPONIN, ED: TROPONIN I, POC: 0 ng/mL (ref 0.00–0.08)

## 2013-08-11 LAB — POC URINE PREG, ED: Preg Test, Ur: NEGATIVE

## 2013-08-11 MED ORDER — ASPIRIN 325 MG PO TABS
325.0000 mg | ORAL_TABLET | ORAL | Status: DC
  Filled 2013-08-11: qty 1

## 2013-08-11 NOTE — ED Notes (Signed)
Pt states that she has been having intermittent chest pain not long enough for her to take her Nitro but today pt states that she started having pinching pain in med of chest and felt like her heart was fluttering.

## 2013-08-11 NOTE — ED Notes (Signed)
Pt states she took 2 81mg  aspirin tablets this morning w/ her Plavix

## 2013-08-11 NOTE — ED Provider Notes (Signed)
CSN: [id number]     Arrival date & time 08/11/13  1028 History   First MD Initiated Contact with Patient 08/11/13 1045     Chief Complaint  Patient presents with  . Chest Pain     (Consider location/radiation/quality/duration/timing/severity/associated sxs/prior Treatment) HPI Pt is a 48yo female with hx of MI in 2006 (5 stents in place), HTN, hyperlipidemia, fatigue, SOB, and chest pain, c/o intermittent chest pain that has gradually worsened in frequency over the last 1-2 weeks. Pt states pain is centralized, "pinching" in nature, occurs 3-4 times per day. States pain only lasts a few seconds. States pain is gone by the time she thinks she needs to use her Nitro.  Today, pt reports generalized fatigue and weakness, "I just don't feel very well."  Reports sensation of heart fluttering earlier this morning. Pt took 2 81mg  aspirin this morning with her Plavix. Pt is not currently having chest pain. Denies diaphoresis, nausea or vomiting. Denies SOB or cough. Denies leg pain or swelling. No previous hx of CHF. Reports last heart cath was in 2007, last stress test was within last year and was normal.  Reports her 46mo f/u appointment with Dr. Rennis GoldenHilty, Cardiology, is coming up in June.    Past Medical History  Diagnosis Date  . Old MI (myocardial infarction) 2006  . Hypertension   . CAD (coronary artery disease)   . Hyperlipidemia   . Obesity   . Cardiac murmur   . History of smoking   . Chest pain   . Fatigue   . SOB (shortness of breath)    Past Surgical History  Procedure Laterality Date  . Cardiac surgery  2006    stent placement  . Abdominal hysterectomy  2002  . Cesarean section  1987  . Transthoracic echocardiogram  07/30/2012    EF 50-55%, moderate mitral regurg  . Cardiovascular stress test  10/02/2012    No significant ST segment change suggestive of ischemia  . Cardiac catheterization  05/18/2004    90% RCA stenosis, stented w/ a [employer] Scientific Liberte 5.0x720mm bare metal  stent - postdilated at 10atmx10sec, stented w/ a second [employer] Liberte 4.5x6316mm bare metal stent at 15atmx30sec, resulting in TIMI III flow.  . Cardiac catheterization  09/08/2004    No intervention - continue medical therapy  . Cardiac catheterization  03/21/2005    Distal RCA 50-60% in-stent restenosis, stented w/ a DES Scimed Taxus Express II 3.5x6032mm stent - postdilated at 20atmx31sec, resulting in TIMI III flow-60% stenosis reduced to 0-10%  . Cardiac catheterization  05/23/2005    No intervention - continue medical therapy  . Cardiac catheterization  11/02/2008    No intervention - continue medical therapy  . Tubal ligation     Family History  Problem Relation Age of Onset  . Aneurysm Mother 60    Dissected  . Hypertension Mother 6960  . Arthritis Mother 7350    Rheumatoid arthritis  . Ulcers Father 40    Peptic ulcers  . Cancer Father 7163  . Hypertension Brother 35  . Heart disease Maternal Grandfather 55  . Hypertension Brother 34   History  Substance Use Topics  . Smoking status: Former Games developermoker  . Smokeless tobacco: Former NeurosurgeonUser    Quit date: 04/03/1987  . Alcohol Use: No   OB History   Grav Para Term Preterm Abortions TAB SAB Ect Mult Living                 Review of Systems  Constitutional: Positive for fatigue. Negative for fever, chills and diaphoresis.  Respiratory: Negative for cough and shortness of breath.   Cardiovascular: Positive for chest pain ( intermittent, "pinching" mid-chest) and palpitations. Negative for leg swelling.  Gastrointestinal: Negative for nausea, vomiting, abdominal pain and diarrhea.  Musculoskeletal: Negative for back pain.  Neurological: Positive for weakness ( generalized weakness).  All other systems reviewed and are negative.     Allergies  Crestor; Lovastatin; Niaspan; Penicillins; and Zocor  Home Medications   Prior to Admission medications   Medication Sig Start Date End Date Taking? Authorizing Provider  aspirin  EC 81 MG tablet Take 162 mg by mouth every morning.    Yes Historical Provider, MD  clopidogrel (PLAVIX) 75 MG tablet Take 75 mg by mouth every morning.    Yes Historical Provider, MD  metoprolol tartrate (LOPRESSOR) 25 MG tablet Take 12.5 mg by mouth at bedtime.   Yes Historical Provider, MD  oxyCODONE-acetaminophen (PERCOCET/ROXICET) 5-325 MG per tablet Take 1-2 tablets by mouth every 6 (six) hours as needed for severe pain.   Yes Historical Provider, MD  pantoprazole (PROTONIX) 40 MG tablet Take 40 mg by mouth at bedtime.    Yes Historical Provider, MD  Pitavastatin Calcium (LIVALO) 2 MG TABS Take 2 mg by mouth at bedtime.   Yes Historical Provider, MD  promethazine (PHENERGAN) 25 MG tablet Take 1 tablet (25 mg total) by mouth every 6 (six) hours as needed for nausea. 02/25/12  Yes Donnetta Hutching, MD   BP 120/98  Pulse 61  Temp(Src) 97.6 F (36.4 C) (Oral)  Resp 20  SpO2 100% Physical Exam  Nursing note and vitals reviewed. Constitutional: She appears well-developed and well-nourished. No distress.  Pt sitting up in exam bed, NAD.   HENT:  Head: Normocephalic and atraumatic.  Eyes: Conjunctivae are normal. No scleral icterus.  Neck: Normal range of motion.  Cardiovascular: Normal rate, regular rhythm and normal heart sounds.   Regular rate and rhythm   Pulmonary/Chest: Effort normal and breath sounds normal. No respiratory distress. She has no wheezes. She has no rales. She exhibits no tenderness.  No respiratory distress, able to speak in full sentences w/o difficulty. Lungs: CTAB  Abdominal: Soft. Bowel sounds are normal. She exhibits no distension and no mass. There is no tenderness. There is no rebound and no guarding.  Musculoskeletal: Normal range of motion.  Neurological: She is alert.  Skin: Skin is warm and dry. She is not diaphoretic.    ED Course  Procedures (including critical care time) Labs Review Labs Reviewed  CBC - Abnormal; Notable for the following:    WBC 3.6  (*)    Hemoglobin 11.9 (*)    HCT 35.9 (*)    All other components within normal limits  BASIC METABOLIC PANEL - Abnormal; Notable for the following:    GFR calc non Af Amer 82 (*)    All other components within normal limits  TROPONIN I  I-STAT TROPOININ, ED  POC URINE PREG, ED    Imaging Review Dg Chest Port 1 View  08/11/2013   CLINICAL DATA:  Chest pain, shortness of breath.  EXAM: PORTABLE CHEST - 1 VIEW  COMPARISON:  02/25/2012  FINDINGS: The heart size and mediastinal contours are within normal limits. Both lungs are clear. The visualized skeletal structures are unremarkable.  IMPRESSION: No active disease.   Electronically Signed   By: Charlett Nose M.D.   On: 08/11/2013 11:16     EKG Interpretation   Date/Time:  Monday Aug 11 2013 10:39:27 EDT Ventricular Rate:  63 PR Interval:  132 QRS Duration: 101 QT Interval:  416 QTC Calculation: 426 R Axis:   30 Text Interpretation:  Sinus rhythm RSR' in V1 or V2, right VCD or RVH No  significant change since last tracing Confirmed by KNAPP  MD-J, JON  [id number]) on 08/11/2013 10:42:48 AM      MDM   Final diagnoses:  Chest pain    Pt with hx of MI in 2006 and 5 stents placed c/o increased frequency of intermittent chest pain that lasts less than 1 minute at a time. No CP in ED. Pt appears well, non-toxic, afebrile, no respiratory distress. CP is atypical, however due to pt hx, cardiac workup performed in ED.  EKG unchanged since previous tracing. Labs: unremarkable. Delta troponin negative for elevation.  Pt may be discharged home to f/u with Dr. Rennis GoldenHilty, cardiology.  Pt stated she will call tomorrow to schedule f/u appointment. Return precautions provided. Pt verbalized understanding and agreement with tx plan.     Junius Finnerrin O'Malley, PA-C 08/11/13 1542

## 2013-08-11 NOTE — Discharge Instructions (Signed)
Chest Pain (Nonspecific) °It is often hard to give a specific diagnosis for the cause of chest pain. There is always a chance that your pain could be related to something serious, such as a heart attack or a blood clot in the lungs. You need to follow up with your caregiver for further evaluation. °CAUSES  °· Heartburn. °· Pneumonia or bronchitis. °· Anxiety or stress. °· Inflammation around your heart (pericarditis) or lung (pleuritis or pleurisy). °· A blood clot in the lung. °· A collapsed lung (pneumothorax). It can develop suddenly on its own (spontaneous pneumothorax) or from injury (trauma) to the chest. °· Shingles infection (herpes zoster virus). °The chest wall is composed of bones, muscles, and cartilage. Any of these can be the source of the pain. °· The bones can be bruised by injury. °· The muscles or cartilage can be strained by coughing or overwork. °· The cartilage can be affected by inflammation and become sore (costochondritis). °DIAGNOSIS  °Lab tests or other studies, such as X-rays, electrocardiography, stress testing, or cardiac imaging, may be needed to find the cause of your pain.  °TREATMENT  °· Treatment depends on what may be causing your chest pain. Treatment may include: °· Acid blockers for heartburn. °· Anti-inflammatory medicine. °· Pain medicine for inflammatory conditions. °· Antibiotics if an infection is present. °· You may be advised to change lifestyle habits. This includes stopping smoking and avoiding alcohol, caffeine, and chocolate. °· You may be advised to keep your head raised (elevated) when sleeping. This reduces the chance of acid going backward from your stomach into your esophagus. °· Most of the time, nonspecific chest pain will improve within 2 to 3 days with rest and mild pain medicine. °HOME CARE INSTRUCTIONS  °· If antibiotics were prescribed, take your antibiotics as directed. Finish them even if you start to feel better. °· For the next few days, avoid physical  activities that bring on chest pain. Continue physical activities as directed. °· Do not smoke. °· Avoid drinking alcohol. °· Only take over-the-counter or prescription medicine for pain, discomfort, or fever as directed by your caregiver. °· Follow your caregiver's suggestions for further testing if your chest pain does not go away. °· Keep any follow-up appointments you made. If you do not go to an appointment, you could develop lasting (chronic) problems with pain. If there is any problem keeping an appointment, you must call to reschedule. °SEEK MEDICAL CARE IF:  °· You think you are having problems from the medicine you are taking. Read your medicine instructions carefully. °· Your chest pain does not go away, even after treatment. °· You develop a rash with blisters on your chest. °SEEK IMMEDIATE MEDICAL CARE IF:  °· You have increased chest pain or pain that spreads to your arm, neck, jaw, back, or abdomen. °· You develop shortness of breath, an increasing cough, or you are coughing up blood. °· You have severe back or abdominal pain, feel nauseous, or vomit. °· You develop severe weakness, fainting, or chills. °· You have a fever. °THIS IS AN EMERGENCY. Do not wait to see if the pain will go away. Get medical help at once. Call your local emergency services (911 in [place] Do not drive yourself to the hospital. °MAKE SURE YOU:  °· Understand these instructions. °· Will watch your condition. °· Will get help right away if you are not doing well or get worse. °Document Released: 12/28/2004 Document Revised: 06/12/2011 Document Reviewed: 10/24/2007 °Exit[employer]® Patient Information ©2014 [employer]   [employer]. ° °

## 2013-08-11 NOTE — ED Notes (Signed)
Provided Pt with Crackers/PB and soda

## 2013-08-14 NOTE — ED Provider Notes (Signed)
Medical screening examination/treatment/procedure(s) were performed by non-physician practitioner and as supervising physician I was immediately available for consultation/collaboration.   EKG Interpretation   Date/Time:  Monday Aug 11 2013 10:39:27 EDT Ventricular Rate:  63 PR Interval:  132 QRS Duration: 101 QT Interval:  416 QTC Calculation: 426 R Axis:   30 Text Interpretation:  Sinus rhythm RSR' in V1 or V2, right VCD or RVH No  significant change since last tracing Confirmed by KNAPP  MD-J, JON  ([id number]) on 08/11/2013 10:42:48 AM        Laray AngerKathleen M Tichina Koebel, DO 08/14/13 1021

## 2013-08-18 ENCOUNTER — Encounter: Payer: Self-pay | Admitting: Cardiology

## 2013-08-18 ENCOUNTER — Ambulatory Visit (INDEPENDENT_AMBULATORY_CARE_PROVIDER_SITE_OTHER): Payer: 59 | Admitting: Cardiology

## 2013-08-18 DIAGNOSIS — R0989 Other specified symptoms and signs involving the circulatory and respiratory systems: Secondary | ICD-10-CM

## 2013-08-18 DIAGNOSIS — R5381 Other malaise: Secondary | ICD-10-CM

## 2013-08-18 DIAGNOSIS — R5383 Other fatigue: Secondary | ICD-10-CM

## 2013-08-18 DIAGNOSIS — R0609 Other forms of dyspnea: Secondary | ICD-10-CM

## 2013-08-18 DIAGNOSIS — R079 Chest pain, unspecified: Secondary | ICD-10-CM

## 2013-08-18 DIAGNOSIS — R0600 Dyspnea, unspecified: Secondary | ICD-10-CM

## 2013-08-18 NOTE — Progress Notes (Signed)
Patient ID: Colleen Dillon, female   DOB: 03/12/1966, 48 y.o.   MRN: [id number]    08/18/2013 Colleen Dillon   03/12/1966  [id number]  Primary Physicia Thora LanceEHINGER,ROBERT R, MD Primary Cardiologist: Dr. Rennis GoldenHilty  HPI:  Colleen Dillon is a 48 y.o. Female with a history of obesity, coronary artery disease, hyperlipidemia, hypertension, formerly followed by Dr. Alanda AmassWeintraub and now by Dr. Rennis GoldenHilty. She had an acute MI in 2006 with single vessel RCA disease with placement of overlapping bare metal stents. She required recatheterization for repeat angina the same day and required subsequent DES stenting of the distal RCA and PLA branches. Her last catheterization was in 2010 and showed 30-50% narrowing of the acute margin. The overlapping DES stents and IVUS interrogation showed good residual lumen. Her chronic PDA occlusion was with collaterals from the left. She ruled out for MI in July 2012. Las nuclear stress test was 10/16/2012 and showed no definite inducible or reversible ischemia. Her EF was 58%. Last 2D echo was April 2014 and showed ejection fraction of 63% with normal wall motion.  [facility] of typical chest pain. This was not typical of her previous angina and was described as brief sharp shooting pain. She denies any recurrence since her visit to the ED. However, she does note that over the last 1 to 2 months she has had increased fatigue and dyspnea on exertion. No lower extremity edema, orthopnea or PND. She denies any chest pressure, tightness or heaviness. No palpitations, syncope/near syncope. She also denies any signs of bleeding including melena and hematochezia.  [facility], a CBC was checked which showed slight anemia with a hemoglobin of 11.9. This is fairly stable, as hemoglobins is in the past 4 years has ranged in the 11-12 range. She is postmenopausal. Again, she denies any melena. TSH was also checked in the ED and was within normal  limits. A BNP was notable for an elevated glucose of 105 mg/dL but was otherwise unremarkable. She does not have a PCP at the current moment. Her last hemoglobin A1c check was in 2012 and was abnormal at 5.7, placing her in the "pre-diabetic" stage.  Current Outpatient Prescriptions  Medication Sig Dispense Refill  . acetaminophen (TYLENOL) 500 MG tablet Take 1,000 mg by mouth every 6 (six) hours as needed for headache.      Marland Kitchen. aspirin EC 81 MG tablet Take 162 mg by mouth every morning.       . clopidogrel (PLAVIX) 75 MG tablet Take 75 mg by mouth every morning.       Marland Kitchen. COENZYME Q-10 PO Take 1 tablet by mouth every morning.      . metoprolol tartrate (LOPRESSOR) 25 MG tablet Take 12.5 mg by mouth at bedtime.      Marland Kitchen. OVER THE COUNTER MEDICATION Take 10,000 Units by mouth every morning. Biotin 10,000 units      . oxyCODONE-acetaminophen (PERCOCET/ROXICET) 5-325 MG per tablet Take 1-2 tablets by mouth every 6 (six) hours as needed for severe pain.      . pantoprazole (PROTONIX) 40 MG tablet Take 40 mg by mouth at bedtime.       . promethazine (PHENERGAN) 25 MG tablet Take 1 tablet (25 mg total) by mouth every 6 (six) hours as needed for nausea.  20 tablet  0   No current facility-administered medications for this visit.    Allergies  Allergen Reactions  . Crestor [Rosuvastatin]  Myalgia   . Lovastatin Other (See Comments)    Myalgias   . Niaspan [Niacin Er] Itching  . Penicillins Other (See Comments)    Child hood allergy  . Zocor [Simvastatin] Other (See Comments)    Myalgias     History   Social History  . Marital Status: Married    Spouse Name: N/A    Number of Children: N/A  . Years of Education: N/A   Occupational History  . Not on file.   Social History Main Topics  . Smoking status: Former Games developermoker  . Smokeless tobacco: Former NeurosurgeonUser    Quit date: 04/03/1987  . Alcohol Use: No  . Drug Use: No  . Sexual Activity: Not on file   Other Topics Concern  . Not on file     Social History Narrative  . No narrative on file     Review of Systems: General: negative for chills, fever, night sweats or weight changes.  Cardiovascular: negative for chest pain, dyspnea on exertion, edema, orthopnea, palpitations, paroxysmal nocturnal dyspnea or shortness of breath Dermatological: negative for rash Respiratory: negative for cough or wheezing Urologic: negative for hematuria Abdominal: negative for nausea, vomiting, diarrhea, bright red blood per rectum, melena, or hematemesis Neurologic: negative for visual changes, syncope, or dizziness All other systems reviewed and are otherwise negative except as noted above.    There were no vitals taken for this visit.  General appearance: alert, cooperative and no distress Neck: no carotid bruit and no JVD Lungs: clear to auscultation bilaterally Heart: regular rate and rhythm, S1, S2 normal, no murmur, click, rub or gallop Extremities: no LEE Pulses: 2+ and symmetric Skin: warm and dry Neurologic: Grossly normal  EKG Sinus Bradycardia, HR 57 bpm  ASSESSMENT AND PLAN:   Fatigue Recent CBC showed slight anemia with a hemoglobin of 11.9 however this has been fairly stable and I feel that this is not significant enough to cause her increased fatigue and dyspnea on exertion. TSH was normal. In light of her history of CAD, we'll check a 2-D echo to reassess systolic function. Will also look for valvular abnormalities, assess wall function and rule out pericardial effusion. We'll also check a hemoglobin A1c, as it was slightly elevated 3 years ago. I have recommended that she establish care with a primary care provider. If the above workup is normal, I have recommended following up with a primary provider to assess for other potential causes of fatigue. May also refer to Dr. Tresa EndoKelly for possible sleep study to evaluate for OSA.    PLAN  Plan to check a 2-D echo to assess systolic function, valvular anatomy and to rule out  pericardial effusion, as well as a Hgb A1c to continue workup for fatigue/DOE. Followup after 2-D echo to reassess and to review results.  Brittainy SimmonsPA-C 08/18/2013 7:39 PM

## 2013-08-18 NOTE — Patient Instructions (Signed)
Your physician has requested that you have an echocardiogram. Echocardiography is a painless test that uses sound waves to create images of your heart. It provides your doctor with information about the size and shape of your heart and how well your heart's chambers and valves are working. This procedure takes approximately one hour. There are no restrictions for this procedure.  Your physician recommends that you return for lab work [facility]  Your physician recommends that you schedule a follow-up appointment in: 2-3 Weeks with Robbie LisBrittainy Simmons PA-C

## 2013-08-18 NOTE — Assessment & Plan Note (Signed)
Recent CBC showed slight anemia with a hemoglobin of 11.9 however this has been fairly stable and I feel that this is not significant enough to cause her increased fatigue and dyspnea on exertion. TSH was normal. In light of her history of CAD, we'll check a 2-D echo to reassess systolic function. We'll also check a hemoglobin A1c, as it was slightly elevated 3 years ago. I have recommended that she establish care with a primary care provider. If the above workup is normal, I have recommended following up with a primary provider to assess for other potential causes of fatigue. May also refer to Dr. Tresa EndoKelly for possible sleep study to evaluate for OSA.

## 2013-08-21 LAB — HEMOGLOBIN A1C
HEMOGLOBIN A1C: 5.7 % — AB (ref <5.7)
Mean Plasma Glucose: 117 mg/dL — ABNORMAL HIGH (ref <117)

## 2013-08-26 ENCOUNTER — Ambulatory Visit (HOSPITAL_COMMUNITY)
Admission: RE | Admit: 2013-08-26 | Discharge: 2013-08-26 | Disposition: A | Discharge status: Home / Self Care | Payer: 59 | Source: Ambulatory Visit | Attending: Cardiovascular Disease | Admitting: Cardiovascular Disease

## 2013-08-26 DIAGNOSIS — R0989 Other specified symptoms and signs involving the circulatory and respiratory systems: Principal | ICD-10-CM | POA: Insufficient documentation

## 2013-08-26 DIAGNOSIS — R0600 Dyspnea, unspecified: Secondary | ICD-10-CM

## 2013-08-26 DIAGNOSIS — R5383 Other fatigue: Secondary | ICD-10-CM

## 2013-08-26 DIAGNOSIS — R0609 Other forms of dyspnea: Secondary | ICD-10-CM | POA: Insufficient documentation

## 2013-08-26 DIAGNOSIS — I059 Rheumatic mitral valve disease, unspecified: Secondary | ICD-10-CM

## 2013-08-26 NOTE — Progress Notes (Signed)
2D Echocardiogram Complete.  08/26/2013   Brigid Vandekamp, RDCS 

## 2013-08-28 ENCOUNTER — Telehealth: Payer: Self-pay | Admitting: *Deleted

## 2013-08-28 NOTE — Telephone Encounter (Signed)
Patient notified that FMLA paperwork is complete and will be ready for pick up at front desk.

## 2013-09-02 ENCOUNTER — Other Ambulatory Visit: Payer: Self-pay | Admitting: *Deleted

## 2013-09-02 MED ORDER — CLOPIDOGREL BISULFATE 75 MG PO TABS: 75.0000 mg | ORAL_TABLET | Freq: Every morning | ORAL | Status: DC

## 2013-09-02 NOTE — Telephone Encounter (Signed)
Rx refill sent to patient pharmacy   

## 2013-09-08 ENCOUNTER — Ambulatory Visit (INDEPENDENT_AMBULATORY_CARE_PROVIDER_SITE_OTHER): Payer: 59 | Admitting: Cardiology

## 2013-09-08 VITALS — BP 112/72 | HR 70 | Ht 62.0 in | Wt 194.4 lb

## 2013-09-08 DIAGNOSIS — R5383 Other fatigue: Secondary | ICD-10-CM

## 2013-09-08 DIAGNOSIS — R5381 Other malaise: Secondary | ICD-10-CM

## 2013-09-08 NOTE — Patient Instructions (Signed)
Continue current medications as prescribed. Follow-up with Dr. Rennis Golden in 6 months or sooner if needed. Establish care with a PCP.

## 2013-09-14 ENCOUNTER — Encounter: Payer: Self-pay | Admitting: Cardiology

## 2013-09-14 NOTE — Progress Notes (Signed)
Patient ID: Colleen Dillon, female   DOB: [birth year], 48 y.o.   MRN: [id number]    09/14/2013 Colleen Dillon   [birth year]  [id number]  Primary Physicia Thora LanceEHINGER,ROBERT R, MD Primary Cardiologist: Dr. Rennis GoldenHilty  HPI:  Colleen Dillon is a 48 y.o. female with a history of obesity, coronary artery disease, hyperlipidemia, hypertension, formerly followed by Dr. Alanda AmassWeintraub and now by Dr. Rennis GoldenHilty. She had an acute MI in 2006 with single vessel RCA disease with placement of overlapping bare metal stents. She required recatheterization for repeat angina the same day and required subsequent DES stenting of the distal RCA and PLA branches. Her main symptom at that time was chest pain. Her last catheterization was in 2010 and showed 30-50% narrowing of the acute margin. The overlapping DES stents and IVUS interrogation showed good residual lumen. Her chronic PDA occlusion was with collaterals from the left. She ruled out for MI in July 2012. Last nuclear stress test was 10/16/2012 and showed no definite inducible or reversible ischemia. Her EF was 58%. Last 2D echo was April 2014 and showed ejection fraction of 63% with normal wall motion.  I evaluated her in clinic on 08/18/2013. At that time she presented with complaints of increased fatigue and dyspnea on exertion. She denied lower shimmy edema, orthopnea and PND as well as chest pressure, tightness or heaviness, palpitations, syncope/near-syncope she also denied any signs of abnormal bleeding including melena and hematochezia. 2 days prior to seeing me at that visit she had lab work done which included a CBC which demonstrated slight anemia with a hemoglobin of 11.9. However, after reviewing her trends over the recent years, this has been fairly stable. She also had recent thyroid function tests which demonstrated a normal TSH. In light of her history of CAD, I recommended that we recheck a 2-D echocardiogram to reassess systolic function, as well as look look for  valvular abnormalities, assess wall function and rule out pericardial effusion. I  also recommended that we check a hemoglobin A1c, as it was slightly elevated 3 years ago and we wanted to be sure that diabetes has not a potential etiology for her symptoms.  She presents back to clinic today for followup and to review her test results. Her 2-D echocardiogram demonstrated normal systolic function with an estimated ejection fraction of 60-65%. Wall motion was normal; there were no regional wall motion abnormalities. There was mild mitral regurgitation. No pericardial effusion. Her Hgb A1c was 5.7, placing her in the category of being at increased risks for developing diabetes.  She states that she continues to have mild symptoms but has noticed significant improvement after self discontinuing her Livalo. She felt that this may be responsible for her fatigue. Since stopping the medication, she has noticed decreased fatigue and increased energy. She continues to deny chest pain.   Current Outpatient Prescriptions  Medication Sig Dispense Refill  . acetaminophen (TYLENOL) 500 MG tablet Take 1,000 mg by mouth every 6 (six) hours as needed for headache.      Marland Kitchen. aspirin EC 81 MG tablet Take 162 mg by mouth every morning.       . clopidogrel (PLAVIX) 75 MG tablet Take 1 tablet (75 mg total) by mouth every morning.  90 tablet  2  . COENZYME Q-10 PO Take 1 tablet by mouth every morning.      . metoprolol tartrate (LOPRESSOR) 25 MG tablet Take 12.5 mg by mouth at bedtime.      Marland Kitchen. OVER THE COUNTER MEDICATION  Take 10,000 Units by mouth every morning. Biotin 10,000 units      . oxyCODONE-acetaminophen (PERCOCET/ROXICET) 5-325 MG per tablet Take 1-2 tablets by mouth every 6 (six) hours as needed for severe pain.      . pantoprazole (PROTONIX) 40 MG tablet Take 40 mg by mouth at bedtime.       . promethazine (PHENERGAN) 25 MG tablet Take 1 tablet (25 mg total) by mouth every 6 (six) hours as needed for nausea.  20  tablet  0   No current facility-administered medications for this visit.    Allergies  Allergen Reactions  . Crestor [Rosuvastatin]     Myalgia   . Lovastatin Other (See Comments)    Myalgias   . Niaspan [Niacin Er] Itching  . Penicillins Other (See Comments)    Child hood allergy  . Zocor [Simvastatin] Other (See Comments)    Myalgias     History   Social History  . Marital Status: Married    Spouse Name: N/A    Number of Children: N/A  . Years of Education: N/A   Occupational History  . Not on file.   Social History Main Topics  . Smoking status: Former Games developer  . Smokeless tobacco: Former Neurosurgeon    Quit date: 04/03/1987  . Alcohol Use: No  . Drug Use: No  . Sexual Activity: Not on file   Other Topics Concern  . Not on file   Social History Narrative  . No narrative on file     Review of Systems: General: negative for chills, fever, night sweats or weight changes.  Cardiovascular: negative for chest pain, dyspnea on exertion, edema, orthopnea, palpitations, paroxysmal nocturnal dyspnea or shortness of breath Dermatological: negative for rash Respiratory: negative for cough or wheezing Urologic: negative for hematuria Abdominal: negative for nausea, vomiting, diarrhea, bright red blood per rectum, melena, or hematemesis Neurologic: negative for visual changes, syncope, or dizziness All other systems reviewed and are otherwise negative except as noted above.    Blood pressure 112/72, pulse 70, height 5\' 2"  (1.575 m), weight 194 lb 6.4 oz (88.179 kg).  General appearance: alert, cooperative and no distress Neck: no carotid bruit and no JVD Lungs: clear to auscultation bilaterally Heart: regular rate and rhythm, S1, S2 normal, no murmur, click, rub or gallop Extremities: no LEE Pulses: 2+ and symmetric Skin: warm and dry Neurologic: Grossly normal  EKG Not performed  ASSESSMENT AND PLAN:   1. DOE/Fatigue: Workup including CBC, TSH, hemoglobin A1c  and 2-D echocardiogram were unremarkable for any significant findings. She has normal systolic function with an estimated ejection fraction of 60-65%. There were no evidence of any significant valvular abnormalities and no pericardial effusion. Wall motion was normal. She has not had any recent anginal pain. Her blood pressure and heart rate are both stable. After self discontinuing her Livalo, she has noticed significant improvement in her symptoms, noting decreased fatigue and increased energy levels. Im not quite sure if there is a direct correlation between Livalo wall and increased ,however she wishes to remain off of this medication. We did review her last lipid panel together. This was last checked in March of this year. Her LDL was close to goal at 71 mg/dL. This was significantly improved from a year prior when her LDL was 122 mg/dL. This was in the setting of statin therapy with Livalo. We discussed that if she was going to discontinue her statin therapy completely, then she would need to be sure that she  make efforts to control her cholesterol levels through lifestyle modifications which should include eating a balanced heart healthy diet as well as increasing her physical activity levels. She is in agreement to try to keep levels under control through diet and exercise.  2. Pre-diabetes: Hemoglobin A1c is 5.7. We discussed the significance of this value and the importance of eating a healthy diet, increasing physical activity and maintaining a healthy weight to ensure that she does not progress to full diabetes. Recommend followup hemoglobin A1c in 6-12 months for reassessment.  PLAN  no further workup indicated at this time. She has been instructed to followup with Dr. Rennis GoldenHilty in 6 months for repeat followup or sooner if needed.   SIMMONS, BRITTAINYPA-C 09/14/2013 9:59 PM

## 2013-09-23 ENCOUNTER — Telehealth: Payer: Self-pay | Admitting: Internal Medicine

## 2013-09-23 DIAGNOSIS — L723 Sebaceous cyst: Secondary | ICD-10-CM | POA: Insufficient documentation

## 2013-09-23 NOTE — Telephone Encounter (Signed)
Pt need to have a cyst removed from her back. She wants to know if she can stop her Plavix and aspirin for 5 days?.Marland Kitchen

## 2013-09-23 NOTE — Telephone Encounter (Signed)
Patient is to have surgery by Dr. Wayland Denislaire Sanger. If can come off ASA and Plavix for 5 days, procedure will be done in office.   Will defer to Dr. Rennis GoldenHilty to advise

## 2013-09-24 ENCOUNTER — Encounter: Payer: Self-pay | Admitting: *Deleted

## 2013-09-24 NOTE — Telephone Encounter (Signed)
Pt returning your call

## 2013-09-24 NOTE — Telephone Encounter (Signed)
Yes .. That would be okay to stop aspirin and plavix for 5 days prior to the procedure.  Dr. HRexene Edison

## 2013-09-24 NOTE — Telephone Encounter (Signed)
Spoke with patient. She is aware of holding medications when procedure is scheduled. Voiced understanding.

## 2013-09-24 NOTE — Telephone Encounter (Signed)
E-faxed clearance to Dr. Kelly SplinterSanger  Printed letter and faxed to Dr. Kelly SplinterSanger at [phone]  Left VM for patient with information per Dr. Rennis GoldenHilty and informing her Dr. Kelly SplinterSanger will be notified.

## 2014-03-03 ENCOUNTER — Other Ambulatory Visit: Payer: Self-pay | Admitting: *Deleted

## 2014-03-03 MED ORDER — NITROGLYCERIN 0.4 MG SL SUBL: 0.4000 mg | SUBLINGUAL_TABLET | SUBLINGUAL | Status: DC | PRN

## 2014-03-03 NOTE — Telephone Encounter (Signed)
Rx was sent to pharmacy electronically. 

## 2014-04-14 ENCOUNTER — Other Ambulatory Visit: Payer: Self-pay | Admitting: Obstetrics and Gynecology

## 2014-04-14 DIAGNOSIS — R928 Other abnormal and inconclusive findings on diagnostic imaging of breast: Secondary | ICD-10-CM

## 2014-04-17 ENCOUNTER — Ambulatory Visit
Admission: RE | Admit: 2014-04-17 | Discharge: 2014-04-17 | Disposition: A | Discharge status: Home / Self Care | Payer: 59 | Source: Ambulatory Visit | Attending: Obstetrics and Gynecology | Admitting: Obstetrics and Gynecology

## 2014-04-17 DIAGNOSIS — R928 Other abnormal and inconclusive findings on diagnostic imaging of breast: Secondary | ICD-10-CM

## 2014-04-28 ENCOUNTER — Other Ambulatory Visit: Payer: Self-pay | Admitting: Cardiovascular Disease

## 2014-04-28 NOTE — Telephone Encounter (Signed)
Rx(s) sent to pharmacy electronically.  

## 2014-04-29 ENCOUNTER — Other Ambulatory Visit: Payer: Self-pay

## 2014-05-01 ENCOUNTER — Other Ambulatory Visit: Payer: Self-pay

## 2014-05-06 ENCOUNTER — Other Ambulatory Visit: Payer: Self-pay

## 2014-05-07 ENCOUNTER — Other Ambulatory Visit: Payer: Self-pay

## 2014-05-12 ENCOUNTER — Other Ambulatory Visit: Payer: Self-pay

## 2014-05-12 MED ORDER — PANTOPRAZOLE SODIUM 40 MG PO TBEC: 40.0000 mg | DELAYED_RELEASE_TABLET | Freq: Every day | ORAL | Status: DC

## 2014-05-12 NOTE — Telephone Encounter (Signed)
Rx(s) sent to pharmacy electronically.  

## 2014-06-03 ENCOUNTER — Ambulatory Visit (INDEPENDENT_AMBULATORY_CARE_PROVIDER_SITE_OTHER): Payer: 59 | Admitting: Internal Medicine

## 2014-06-03 ENCOUNTER — Encounter: Payer: Self-pay | Admitting: Internal Medicine

## 2014-06-03 VITALS — BP 106/74 | HR 66 | Ht 62.0 in | Wt 201.8 lb

## 2014-06-03 DIAGNOSIS — I2583 Coronary atherosclerosis due to lipid rich plaque: Secondary | ICD-10-CM

## 2014-06-03 DIAGNOSIS — E669 Obesity, unspecified: Secondary | ICD-10-CM

## 2014-06-03 DIAGNOSIS — E785 Hyperlipidemia, unspecified: Secondary | ICD-10-CM

## 2014-06-03 DIAGNOSIS — I2510 Atherosclerotic heart disease of native coronary artery without angina pectoris: Secondary | ICD-10-CM

## 2014-06-03 DIAGNOSIS — R5383 Other fatigue: Secondary | ICD-10-CM

## 2014-06-03 NOTE — Patient Instructions (Signed)
Your physician recommends that you return for lab work FASTING  Your physician recommends that you schedule a follow-up appointment 1 year.

## 2014-06-03 NOTE — Progress Notes (Signed)
Date:  06/03/2014   ID:  Colleen, Dillon [birth year], MRN [id number]  PCP:  Thora Lance, MD  Primary Cardiologist:  Ednah Hammock    History of Present Illness: Colleen Dillon is a 49 y.o. female history of obesity, coronary artery disease, hyperlipidemia, hypertension, formerly followed by Dr. Alanda Amass - here to establish care with me today. Patient had acute DMI in 2006 with single vessel RCA disease with placement of overlapping bare metal stents. A recatheterization for repeat angina the same day and required subsequent DES stenting of the distal RCA and PLA branches peeling occluded at that time. Last catheterization was in 2010 showed 30-50% narrowing of the acute margin. The overlapping DES stents and IVUS interrogation showed good residual lumen. His chronic PDA occlusion with collaterals from the left. She ruled out for MI in July 2012 last nuclear stress test was 10/17/2010 showed no definite inducible or reversible ischemia the EF of 58%.  Last echo was April 2014 showed ejection fraction of 50-55% with normal wall motion. Is moderate mitral valve regurgitation. Currently works as a Engineer, civil (consulting) at [employer].   She last saw Colleen Finlay, Colleen Dillon for symptoms of fatigue and chest pain this past summer and underwent a nuclear stress test on 10/02/2012 which was negative for ischemia. She exercised to 11.2 metabolic equivalents and had no chest pain. Overall she has done well and recently had a lipid profile which demonstrated LDL cholesterol of 122.  She was previously on Crestor 10 mg daily but was intolerant of this due to side effects including myalgias. She is also previously failed Zocor and lovastatin.  I saw Colleen Dillon back in the office today. She is doing well although she reports some morning headaches. She recently had some weight gain which she reports is due to stress eating. She's been under a lot of stress as her husband has some unusual neurologic disorder and her daughter  recently had surgery and is requiring attention and dressing changes. She denies any chest pain or worsening shortness of breath.  Wt Readings from Last 3 Encounters:  06/03/14 201 lb 12.8 oz (91.536 kg)  09/08/13 194 lb 6.4 oz (88.179 kg)  04/02/13 198 lb 9.6 oz (90.084 kg)     Past Medical History  Diagnosis Date  . Old MI (myocardial infarction) 2006  . Hypertension   . CAD (coronary artery disease)   . Hyperlipidemia   . Obesity   . Cardiac murmur   . History of smoking   . Chest pain   . Fatigue   . SOB (shortness of breath)     Current Outpatient Prescriptions  Medication Sig Dispense Refill  . acetaminophen (TYLENOL) 500 MG tablet Take 1,000 mg by mouth every 6 (six) hours as needed for headache.    Marland Kitchen aspirin EC 81 MG tablet Take 162 mg by mouth every morning.     . clopidogrel (PLAVIX) 75 MG tablet Take 1 tablet (75 mg total) by mouth every morning. 90 tablet 2  . COENZYME Q-10 PO Take 1 tablet by mouth every morning.    . Estradiol (MINIVELLE TD) Place 0.075 mg onto the skin once a week.    . metoprolol tartrate (LOPRESSOR) 25 MG tablet Take 12.5 mg by mouth at bedtime.    . metoprolol tartrate (LOPRESSOR) 25 MG tablet Take 0.5 tablets (12.5 mg total) by mouth daily. <PLEASE MAKE APPOINTMENT FOR REFILLS> 15 tablet 1  . nitroGLYCERIN (NITROSTAT) 0.4 MG SL tablet Place 1 tablet (0.4  mg total) under the tongue every 5 (five) minutes as needed for chest pain (Max 3 doses.). 25 tablet 0  . OVER THE COUNTER MEDICATION Take 10,000 Units by mouth every morning. Biotin 10,000 units    . oxyCODONE-acetaminophen (PERCOCET/ROXICET) 5-325 MG per tablet Take 1-2 tablets by mouth every 6 (six) hours as needed for severe pain.    . pantoprazole (PROTONIX) 40 MG tablet Take 1 tablet (40 mg total) by mouth at bedtime. 90 tablet 1  . promethazine (PHENERGAN) 25 MG tablet Take 1 tablet (25 mg total) by mouth every 6 (six) hours as needed for nausea. 20 tablet 0   No current  facility-administered medications for this visit.    Allergies:    Allergies  Allergen Reactions  . Crestor [Rosuvastatin]     Myalgia   . Lovastatin Other (See Comments)    Myalgias   . Niaspan [Niacin Er] Itching  . Penicillins Other (See Comments)    Child hood allergy  . Zocor [Simvastatin] Other (See Comments)    Myalgias        Social History:  The patient  reports that she has quit smoking. She quit smokeless tobacco use about 27 years ago. She reports that she does not drink alcohol or use illicit drugs.    Family history: No family history of hypothyroidism  ROS:  Please see the history of present illness.  All other systems reviewed and negative.   PHYSICAL EXAM: VS:  BP 106/74 mmHg  Pulse 66  Ht 5\' 2"  (1.575 m)  Wt 201 lb 12.8 oz (91.536 kg)  BMI 36.90 kg/m2 Well nourished, well developed, in no acute distress HEENT: Pupils are equal round react to light accommodation extraocular movements are intact.  Neck: no JVDNo cervical lymphadenopathy. Cardiac: Regular rate and rhythm without murmurs rubs or gallops. Lungs:  clear to auscultation bilaterally, no wheezing, rhonchi or rales Abd: soft, nontender, positive bowel sounds all quadrants, no hepatosplenomegaly Ext: no lower extremity edema.  2+ radial and dorsalis pedis pulses. Skin: warm and dry Neuro:  Grossly normal, strength 5 out of 5 equal in upper and lower extremities  EKG:  Normal sinus rhythm at 66  ASSESSMENT AND PLAN:  Problem List Items Addressed This Visit    Obesity (BMI 35.0-39.9 without comorbidity)   Coronary artery disease:   Hyperlipidemia - Primary   Relevant Orders   NMR Lipoprofile with Lipids   Fatigue     PLAN: 1.  Colleen Dillon is doing well without any cardiac complaints she denies any chest pain or worsening shortness of breath. Her cholesterol was elevated and a lipid profile one year ago showed excellent cholesterol control when she was on low-dose cholesterol medicine  however she was intolerant to this and discontinued it. I do not believe is her been reassessed since then and she is due for another lipid check. We'll go ahead and recheck her cholesterol and plan to see her back annually or sooner as necessary. As mentioned she does report some of fatigue and some morning headaches. She was told that she does not snore and there is low suspicion for sleep apnea. I've encouraged her to work on her diet as well as healthier eating habits if she is to snack or stress eat.  Plan to see her back annually or sooner as necessary.  Chrystie NoseKenneth C. Tomika Eckles, MD, [facility] Attending Cardiologist [facility]

## 2014-06-21 LAB — NMR LIPOPROFILE WITH LIPIDS
Cholesterol, Total: 178 mg/dL (ref 100–199)
HDL PARTICLE NUMBER: 37 umol/L (ref >=30.5)
HDL Size: 9.2 nm (ref >=9.2)
HDL-C: 56 mg/dL (ref >39)
LARGE HDL: 5.7 umol/L (ref >=4.8)
LDL CALC: 105 mg/dL — AB (ref 0–99)
LDL Particle Number: 1121 nmol/L — ABNORMAL HIGH (ref <1000)
LDL SIZE: 21.9 nm (ref >=20.8)
LP-IR SCORE: 29 (ref <=45)
Large VLDL-P: 1.3 nmol/L (ref <=2.7)
SMALL LDL PARTICLE NUMBER: 142 nmol/L (ref <=527)
Triglycerides: 85 mg/dL (ref 0–149)
VLDL Size: 45.3 nm (ref <=46.6)

## 2014-06-25 IMAGING — CT CT ABD-PELV W/ CM
2 of 4 series · 16 of 46 positions shown, 18 images · IV contrast (Omnipaque 300)
Comparison: CT of the abdomen and pelvis 11/14/2001.

CLINICAL DATA: Elevated lipase.  Back pain.Nausea and vomiting.

CT ABDOMEN AND PELVIS WITH CONTRAST
TECHNIQUE: Multidetector CT imaging of the abdomen and pelvis was
performed following the standard protocol during bolus
administration of intravenous contrast.
Contrast: 100mL OMNIPAQUE IOHEXOL 300 MG/ML  SOLN

[Series 2: abd_pel_with 5.0 b40f · axial · 0.77mm/px · z∈[-479,-79]mm · 13 of 90 slices shown, 15 images]
[im 5/90  soft-tissue]
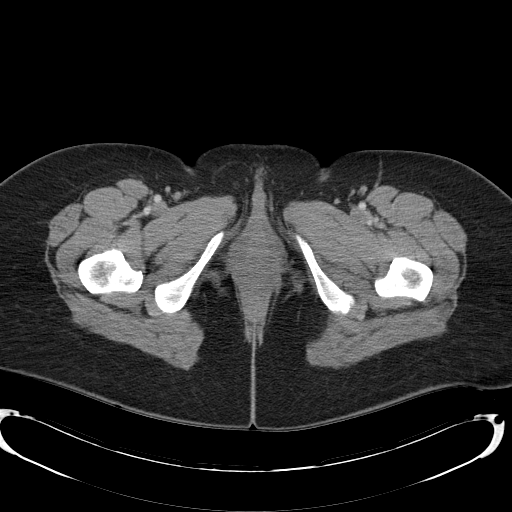
[im 5/90  bone]
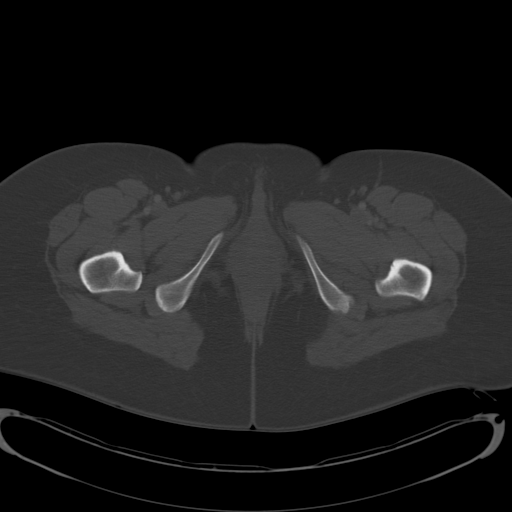
[im 14/90  soft-tissue]
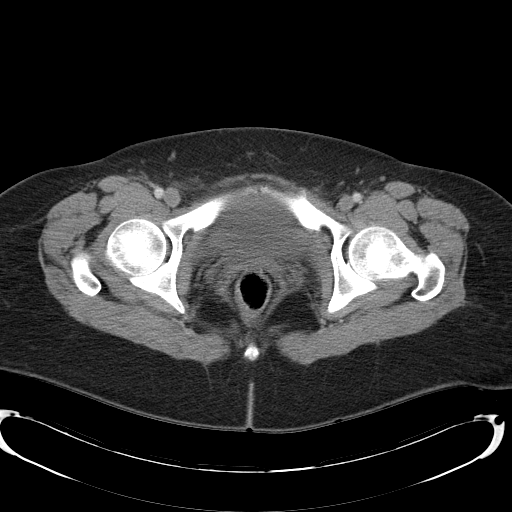
[im 18/90  soft-tissue]
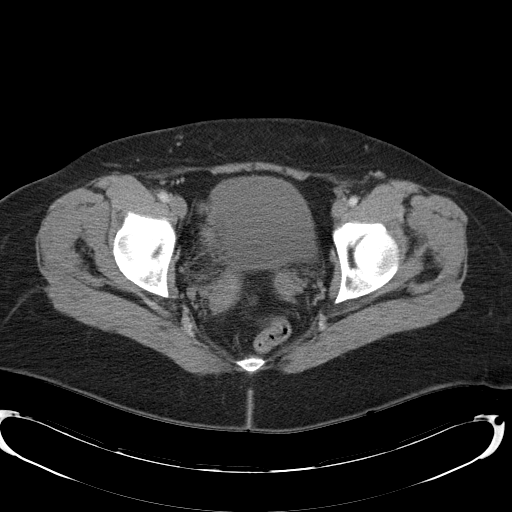
[im 27/90  soft-tissue]
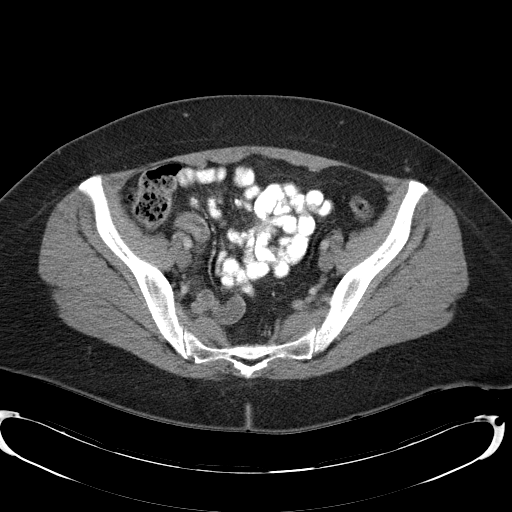
[im 32/90  soft-tissue]
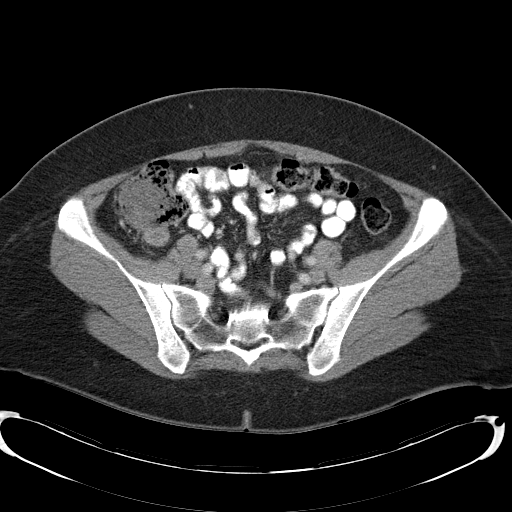
[im 41/90  soft-tissue]
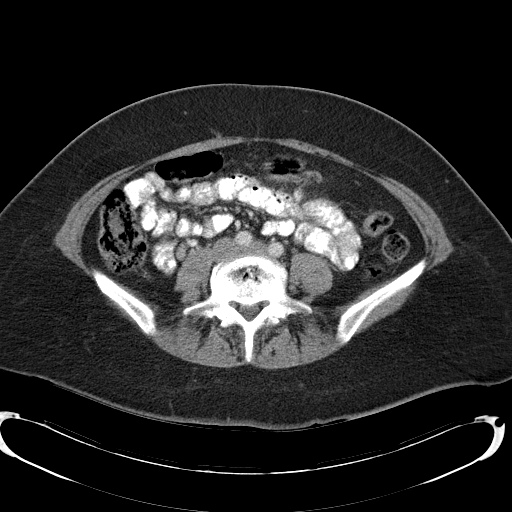
[im 45/90  soft-tissue]
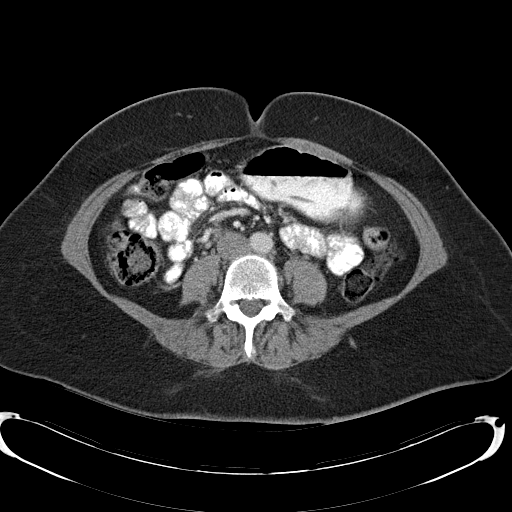
[im 49/90  soft-tissue]
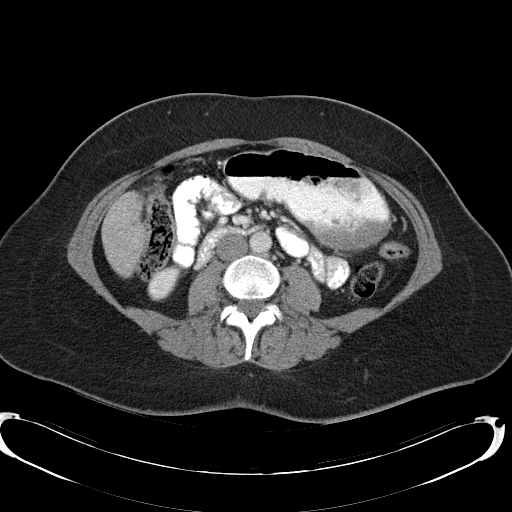
[im 58/90  soft-tissue]
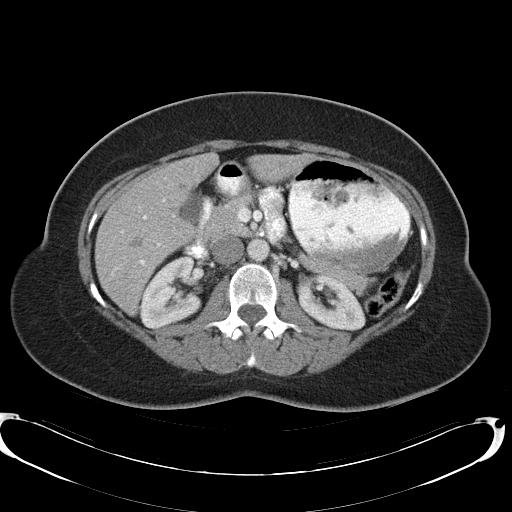
[im 58/90  bone]
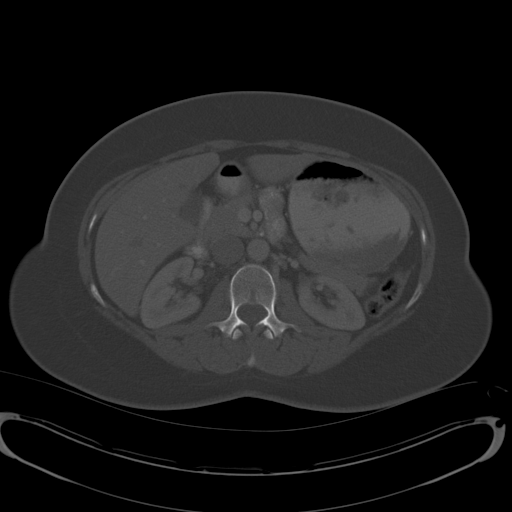
[im 63/90  soft-tissue]
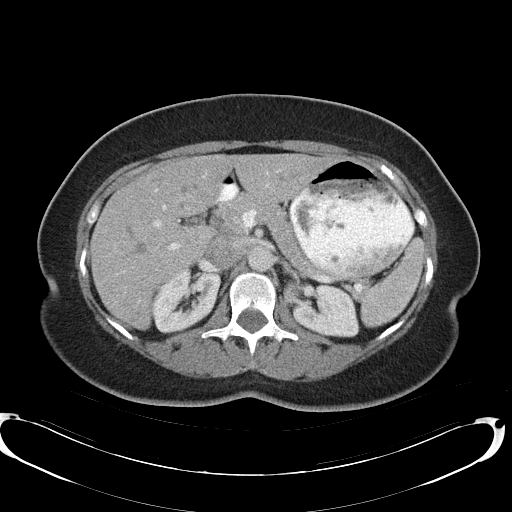
[im 72/90  soft-tissue]
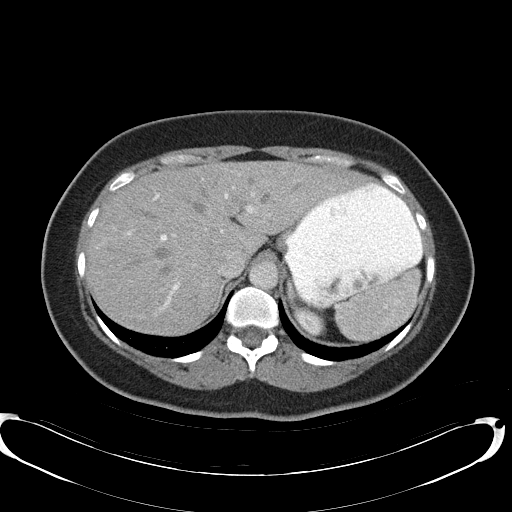
[im 76/90  soft-tissue]
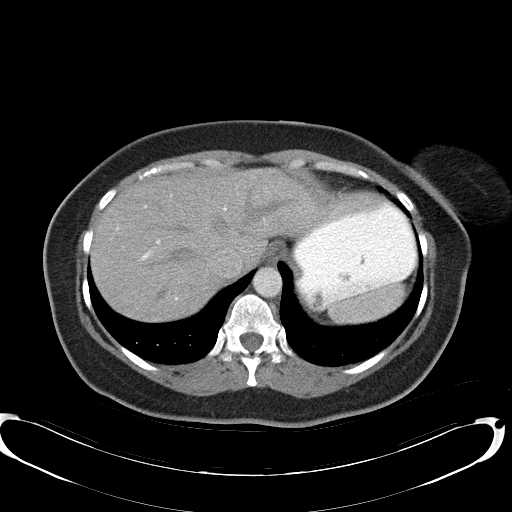
[im 85/90  soft-tissue]
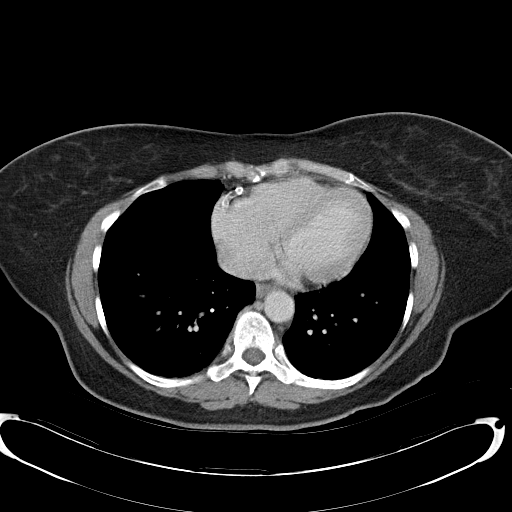

[Series 4: abd_pel_with 3.0 spo cor · coronal · 0.75mm/px · 3 of 93 slices shown]
[im 31/93  soft-tissue]
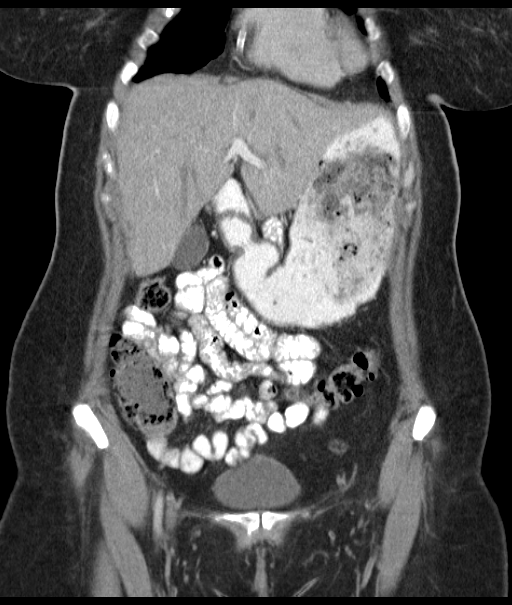
[im 41/93  soft-tissue]
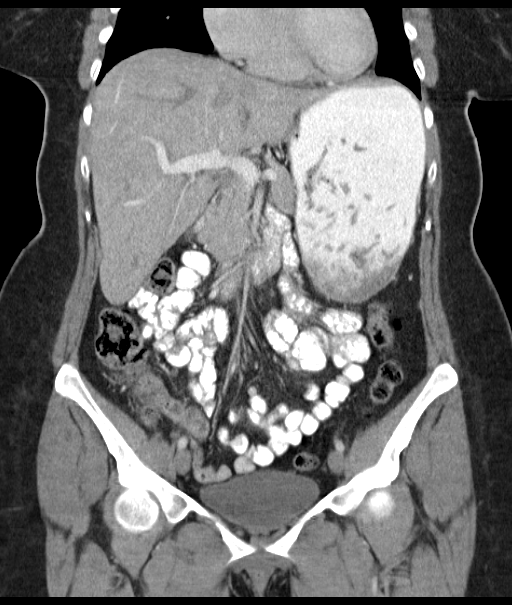
[im 52/93  soft-tissue]
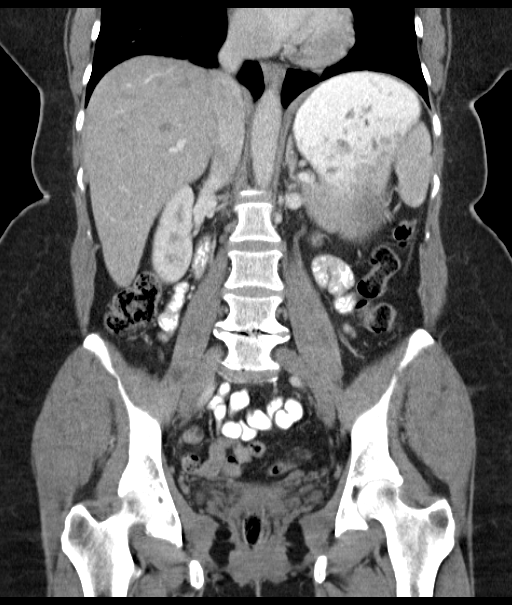

[16 of 46 positions shown; findings below may reference images not displayed]

FINDINGS: Lung Bases: Coronary artery stents in the distal right coronary
artery.

Abdomen/Pelvis:  The pancreas is normal in appearance.
Specifically, the pancreatic parenchyma enhances normally, there
are no definite peripancreatic inflammatory changes and no
peripancreatic fluid collections.

The enhanced appearance of the liver, gallbladder, spleen,
bilateral adrenal glands and the right kidney is unremarkable.  In
the lower pole of the left kidney there is a subcentimeter low
attenuation lesion which is too small to definitively characterize
but is similar in retrospect compared to remote prior study from 10
years ago, most compatible with a small cyst.  No ascites or
pneumoperitoneum and no pathologic distension of small bowel.  No
definite pathologic lymphadenopathy identified within the abdomen
or pelvis.  Status post hysterectomy.  Ovaries are not confidently
identified and may either be surgically absent or atrophic.
Urinary bladder is unremarkable in appearance.

Musculoskeletal: There are no aggressive appearing lytic or blastic
lesions noted in the visualized portions of the skeleton.
IMPRESSION: 1.  No acute findings in the abdomen or pelvis.
2.  Specifically, the pancreas is morphologically normal in
appearance at this time.
3.  Additional incidental findings, as above.

## 2014-06-29 ENCOUNTER — Other Ambulatory Visit: Payer: Self-pay | Admitting: *Deleted

## 2014-06-29 DIAGNOSIS — E785 Hyperlipidemia, unspecified: Secondary | ICD-10-CM

## 2014-09-03 ENCOUNTER — Other Ambulatory Visit: Payer: Self-pay | Admitting: Internal Medicine

## 2014-09-03 NOTE — Telephone Encounter (Signed)
Rx has been sent to the pharmacy electronically. ° °

## 2015-03-03 ENCOUNTER — Other Ambulatory Visit: Payer: Self-pay | Admitting: Internal Medicine

## 2015-03-03 NOTE — Telephone Encounter (Signed)
REFILL 

## 2015-03-05 ENCOUNTER — Other Ambulatory Visit: Payer: Self-pay | Admitting: *Deleted

## 2015-03-05 MED ORDER — NITROGLYCERIN 0.4 MG SL SUBL: 0.4000 mg | SUBLINGUAL_TABLET | SUBLINGUAL | Status: DC | PRN

## 2015-03-05 NOTE — Telephone Encounter (Signed)
Refilled nitro SL for generic name per pharm request.

## 2015-06-07 ENCOUNTER — Other Ambulatory Visit: Payer: Self-pay

## 2015-06-07 DIAGNOSIS — Z1231 Encounter for screening mammogram for malignant neoplasm of breast: Secondary | ICD-10-CM

## 2015-06-07 MED FILL — CLOPIDOGREL 75 MG TABLET: 75 | 90 days supply | Qty: 90 | Fill #2

## 2015-06-08 ENCOUNTER — Encounter: Payer: 59 | Admitting: Cardiovascular Disease

## 2015-06-09 ENCOUNTER — Ambulatory Visit: Payer: 59

## 2015-06-23 ENCOUNTER — Ambulatory Visit: Payer: 59

## 2015-06-30 ENCOUNTER — Ambulatory Visit (INDEPENDENT_AMBULATORY_CARE_PROVIDER_SITE_OTHER): Payer: 59 | Admitting: Internal Medicine

## 2015-06-30 ENCOUNTER — Encounter: Payer: Self-pay | Admitting: Internal Medicine

## 2015-06-30 VITALS — BP 104/74 | HR 92 | Ht 61.0 in | Wt 196.0 lb

## 2015-06-30 DIAGNOSIS — E785 Hyperlipidemia, unspecified: Secondary | ICD-10-CM | POA: Diagnosis not present

## 2015-06-30 DIAGNOSIS — R0602 Shortness of breath: Secondary | ICD-10-CM

## 2015-06-30 DIAGNOSIS — E669 Obesity, unspecified: Secondary | ICD-10-CM

## 2015-06-30 DIAGNOSIS — R0600 Dyspnea, unspecified: Secondary | ICD-10-CM | POA: Insufficient documentation

## 2015-06-30 DIAGNOSIS — I2510 Atherosclerotic heart disease of native coronary artery without angina pectoris: Secondary | ICD-10-CM | POA: Diagnosis not present

## 2015-06-30 DIAGNOSIS — R0609 Other forms of dyspnea: Secondary | ICD-10-CM | POA: Insufficient documentation

## 2015-06-30 NOTE — Progress Notes (Signed)
Date:  06/30/2015   ID:  Colleen DieselKeela M Harbor, DOB 1965/04/10, MRN [id number]  PCP:  Thora LanceEHINGER,ROBERT R, MD  Primary Cardiologist:  Colleen Dillon    History of Present Illness: Colleen DiegoKeela M Dillon is a 50 y.o. female history of obesity, coronary artery disease, hyperlipidemia, hypertension, formerly followed by Dr. Alanda AmassWeintraub - here to establish care with me today. Patient had acute DMI in 2006 with single vessel RCA disease with placement of overlapping bare metal stents. A recatheterization for repeat angina the same day and required subsequent DES stenting of the distal RCA and PLA branches peeling occluded at that time. Last catheterization was in 2010 showed 30-50% narrowing of the acute margin. The overlapping DES stents and IVUS interrogation showed good residual lumen. His chronic PDA occlusion with collaterals from the left. She ruled out for MI in July 2012 last nuclear stress test was 10/17/2010 showed no definite inducible or reversible ischemia the EF of 58%.  Last echo was April 2014 showed ejection fraction of 50-55% with normal wall motion. Is moderate mitral valve regurgitation. Currently works as a Engineer, civil (consulting)nurse at [employer].   She last saw Wilburt FinlayBryan Hager, PA-C for symptoms of fatigue and chest pain this past summer and underwent a nuclear stress test on 10/02/2012 which was negative for ischemia. She exercised to 11.2 metabolic equivalents and had no chest pain. Overall she has done well and recently had a lipid profile which demonstrated LDL cholesterol of 122.  She was previously on Crestor 10 mg daily but was intolerant of this due to side effects including myalgias. She is also previously failed Zocor and lovastatin.  I saw [name] back in the office today. She is doing well although she reports some morning headaches. She recently had some weight gain which she reports is due to stress eating. She's been under a lot of stress as her husband has some unusual neurologic disorder and her daughter  recently had surgery and is requiring attention and dressing changes. She denies any chest pain or worsening shortness of breath.  [name] returns today for follow-up. She reports a recently she's had some worsening shortness of breath. She's had a walk a longer distance up a hill to get to work and notes that she feels more short of breath. She does exercise about 3 times a week, but generally does not do any significant incline on the treadmill. She has managed to lose little bit of weight recently due to dietary changes as her husband is at goal on a strict diet. She denies any chest pain. Her EKG is essentially unchanged with sinus rhythm and occasional PVCs.  Wt Readings from Last 3 Encounters:  06/30/15 196 lb (88.905 kg)  06/03/14 201 lb 12.8 oz (91.536 kg)  09/08/13 194 lb 6.4 oz (88.179 kg)     Past Medical History  Diagnosis Date  . Old MI (myocardial infarction) 2006  . Hypertension   . CAD (coronary artery disease)   . Hyperlipidemia   . Obesity   . Cardiac murmur   . History of smoking   . Chest pain   . Fatigue   . SOB (shortness of breath)     Current Outpatient Prescriptions  Medication Sig Dispense Refill  . acetaminophen (TYLENOL) 500 MG tablet Take 1,000 mg by mouth every 6 (six) hours as needed for headache.    Marland Kitchen. aspirin EC 81 MG tablet Take 162 mg by mouth every morning.     . clopidogrel (PLAVIX) 75 MG  tablet TAKE 1 TABLET BY MOUTH EVERY MORNING. 90 tablet 2  . metoprolol tartrate (LOPRESSOR) 25 MG tablet Take 12.5 mg by mouth at bedtime.    . metoprolol tartrate (LOPRESSOR) 25 MG tablet Take 0.5 tablets (12.5 mg total) by mouth daily. <PLEASE MAKE APPOINTMENT FOR REFILLS> 15 tablet 1  . nitroGLYCERIN (NITROSTAT) 0.4 MG SL tablet Place 1 tablet (0.4 mg total) under the tongue every 5 (five) minutes as needed for chest pain. 25 tablet 3  . OVER THE COUNTER MEDICATION Take 10,000 Units by mouth every morning. Biotin 10,000 units    . oxyCODONE-acetaminophen  (PERCOCET/ROXICET) 5-325 MG per tablet Take 1-2 tablets by mouth every 6 (six) hours as needed for severe pain.    . pantoprazole (PROTONIX) 40 MG tablet Take 1 tablet (40 mg total) by mouth at bedtime. 90 tablet 1  . promethazine (PHENERGAN) 25 MG tablet Take 1 tablet (25 mg total) by mouth every 6 (six) hours as needed for nausea. 20 tablet 0   No current facility-administered medications for this visit.    Allergies:    Allergies  Allergen Reactions  . Crestor [Rosuvastatin]     Myalgia   . Lovastatin Other (See Comments)    Myalgias   . Niaspan [Niacin Er] Itching  . Penicillins Other (See Comments)    Child hood allergy  . Zocor [Simvastatin] Other (See Comments)    Myalgias        Social History:  The patient  reports that she has quit smoking. She quit smokeless tobacco use about 28 years ago. She reports that she does not drink alcohol or use illicit drugs.    Family history: No family history of hypothyroidism  ROS:  Please see the history of present illness.  All other systems reviewed and negative.   PHYSICAL EXAM: VS:  BP 104/74 mmHg  Pulse 92  Ht  (1.549 m)  Wt 196 lb (88.905 kg)  BMI 37.05 kg/m2 Well nourished, well developed, in no acute distress HEENT: Pupils are equal round react to light accommodation extraocular movements are intact.  Neck: no JVDNo cervical lymphadenopathy. Cardiac: Regular rate and rhythm without murmurs rubs or gallops. Lungs:  clear to auscultation bilaterally, no wheezing, rhonchi or rales Abd: soft, nontender, positive bowel sounds all quadrants, no hepatosplenomegaly Ext: no lower extremity edema.  2+ radial and dorsalis pedis pulses. Skin: warm and dry Neuro:  Grossly normal, strength 5 out of 5 equal in upper and lower extremities  EKG:  Normal sinus rhythm with PVCs at 92  ASSESSMENT AND PLAN:  Problem List Items Addressed This Visit    Obesity (BMI 35.0-39.9 without comorbidity) (HCC)   Coronary artery disease:     Hyperlipidemia   DOE (dyspnea on exertion)    Other Visit Diagnoses    Shortness of breath    -  Primary    Relevant Orders    EKG 12-Lead    Cardiopulmonary exercise test    Pulmonary Function Test      PLAN: 1.  Mrs. Sica reports some worsening shortness of breath with exertion. She's actually managed to lose some weight recently, but this may be due to deconditioning and her not being used to walking up hills. It does not sound like angina. She did have clear anginal symptoms in the past. Her last stress test was in 2014 and negative for ischemia. I suspect this is probably more due to weight and deconditioning. I like for her to undergo cardio metabolic testing, however  which could better help me understand that. We'll contact her with those results. Plan otherwise to follow-up annually or sooner if her testing is abnormal.  [name], MD, [facility] Attending Cardiologist [facility]

## 2015-06-30 NOTE — Patient Instructions (Addendum)
Dr. Rennis GoldenHilty has ordered a cardiopulmonary exercise test with full pulmonary function test (PFT) - [facility]  Your physician wants you to follow-up in: 1 year with Dr. Rennis GoldenHilty. You will receive a reminder letter in the mail two months in advance. If you don't receive a letter, please call our office to schedule the follow-up appointment.

## 2015-07-01 ENCOUNTER — Telehealth: Payer: Self-pay | Admitting: Internal Medicine

## 2015-07-01 NOTE — Telephone Encounter (Signed)
Called the patient and left a voicemail regarding CPX test scheduled on 07-13-15 at 3 p.m., with arrival at 2:45.  No caffeine.  Eat something light, nothing heavy and wear work out clothes and tennis shoes.  I also left the phone number if she has any questions or wishes to reschedule.

## 2015-07-06 ENCOUNTER — Telehealth: Payer: Self-pay | Admitting: Internal Medicine

## 2015-07-06 DIAGNOSIS — E785 Hyperlipidemia, unspecified: Secondary | ICD-10-CM

## 2015-07-06 NOTE — Telephone Encounter (Signed)
New message     Pt was recently seen.  She forgot to ask if Dr Rennis GoldenHilty was going to order any blood work.  Ok to call tomorrow

## 2015-07-07 ENCOUNTER — Ambulatory Visit
Admission: RE | Admit: 2015-07-07 | Discharge: 2015-07-07 | Disposition: A | Discharge status: Home / Self Care | Payer: 59 | Source: Ambulatory Visit

## 2015-07-07 DIAGNOSIS — Z1231 Encounter for screening mammogram for malignant neoplasm of breast: Secondary | ICD-10-CM

## 2015-07-07 NOTE — Telephone Encounter (Signed)
Returned call. Pt inquiring on any recommended labwork. Noted nothing ordered at visit. She is recommended for 1 yr f/u. Has had cholesterol, other studies checked by our office in the past. Routed to Dr. Rennis GoldenHilty for recommended tests.

## 2015-07-08 NOTE — Telephone Encounter (Signed)
We could get a lipid profile, but she has been intolerant to statins - so if her cholesterol is up, would she be agreeable to trying additional treatment? If so, go ahead and order a fasting lipid profile.  Dr. HRexene Edison

## 2015-07-09 NOTE — Telephone Encounter (Signed)
Called pt back and she would like to have the lipid profile lab draw. She is also willing to discuss additional treatment options with needed. [facility] to get labs drawn there per pt request.

## 2015-07-13 ENCOUNTER — Ambulatory Visit (HOSPITAL_COMMUNITY): Payer: 59 | Attending: Internal Medicine

## 2015-07-13 DIAGNOSIS — R0602 Shortness of breath: Secondary | ICD-10-CM | POA: Diagnosis not present

## 2015-07-14 ENCOUNTER — Other Ambulatory Visit (HOSPITAL_COMMUNITY): Payer: Self-pay | Admitting: *Deleted

## 2015-07-14 DIAGNOSIS — R0602 Shortness of breath: Secondary | ICD-10-CM

## 2015-07-20 ENCOUNTER — Ambulatory Visit (HOSPITAL_COMMUNITY)
Admission: RE | Admit: 2015-07-20 | Discharge: 2015-07-20 | Disposition: A | Discharge status: Home / Self Care | Payer: 59 | Source: Ambulatory Visit | Attending: Internal Medicine | Admitting: Internal Medicine

## 2015-07-20 DIAGNOSIS — R0602 Shortness of breath: Secondary | ICD-10-CM | POA: Insufficient documentation

## 2015-07-20 LAB — PULMONARY FUNCTION TEST
DL/VA % PRED: 96 %
DL/VA: 4.38 ml/min/mmHg/L
DLCO UNC % PRED: 82 %
DLCO UNC: 17.69 ml/min/mmHg
FEF 25-75 POST: 2.91 L/s
FEF 25-75 Pre: 3.28 L/sec
FEF2575-%CHANGE-POST: -11 %
FEF2575-%PRED-POST: 113 %
FEF2575-%Pred-Pre: 127 %
FEV1-%CHANGE-POST: -2 %
FEV1-%PRED-PRE: 92 %
FEV1-%Pred-Post: 90 %
FEV1-POST: 2.32 L
FEV1-Pre: 2.39 L
FEV1FVC-%Change-Post: 3 %
FEV1FVC-%PRED-PRE: 107 %
FEV6-%Change-Post: -5 %
FEV6-%Pred-Post: 81 %
FEV6-%Pred-Pre: 87 %
FEV6-Post: 2.59 L
FEV6-Pre: 2.76 L
FEV6FVC-%Pred-Post: 103 %
FEV6FVC-%Pred-Pre: 103 %
FVC-%CHANGE-POST: -6 %
FVC-%PRED-POST: 79 %
FVC-%PRED-PRE: 84 %
FVC-POST: 2.59 L
FVC-PRE: 2.76 L
POST FEV1/FVC RATIO: 89 %
PRE FEV1/FVC RATIO: 86 %
PRE FEV6/FVC RATIO: 100 %
Post FEV6/FVC ratio: 100 %
RV % PRED: 74 %
RV: 1.28 L
TLC % pred: 83 %
TLC: 3.96 L

## 2015-07-20 MED ORDER — ALBUTEROL SULFATE (2.5 MG/3ML) 0.083% IN NEBU
2.5000 mg | INHALATION_SOLUTION | Freq: Once | RESPIRATORY_TRACT | Status: AC
  Administered 2015-07-20: 2.5 mg via RESPIRATORY_TRACT

## 2015-10-07 ENCOUNTER — Other Ambulatory Visit: Payer: Self-pay | Admitting: Internal Medicine

## 2015-10-07 MED FILL — CLOPIDOGREL 75 MG TABLET: 75 | 90 days supply | Qty: 90 | Fill #0

## 2015-10-07 NOTE — Telephone Encounter (Signed)
Rx(s) sent to pharmacy electronically.  

## 2015-12-28 DIAGNOSIS — I2510 Atherosclerotic heart disease of native coronary artery without angina pectoris: Secondary | ICD-10-CM | POA: Diagnosis not present

## 2015-12-28 DIAGNOSIS — R454 Irritability and anger: Secondary | ICD-10-CM | POA: Diagnosis not present

## 2015-12-28 DIAGNOSIS — R5383 Other fatigue: Secondary | ICD-10-CM | POA: Diagnosis not present

## 2015-12-28 DIAGNOSIS — R635 Abnormal weight gain: Secondary | ICD-10-CM | POA: Diagnosis not present

## 2015-12-29 MED FILL — NITROGLYCERIN 0.4 MG TAB SL: 0.4 | 8 days supply | Qty: 25 | Fill #1

## 2015-12-29 MED FILL — CLOPIDOGREL 75 MG TABLET: 75 | 90 days supply | Qty: 90 | Fill #1

## 2015-12-29 MED FILL — BUPROPION HCL XL 150 MG TAB: 150 | 30 days supply | Qty: 30 | Fill #0

## 2016-01-19 ENCOUNTER — Ambulatory Visit: Payer: 59 | Admitting: Internal Medicine

## 2016-01-31 DIAGNOSIS — R454 Irritability and anger: Secondary | ICD-10-CM | POA: Diagnosis not present

## 2016-01-31 MED FILL — BUPROPION HCL XL 300 MG TAB: 300 | 30 days supply | Qty: 30 | Fill #0

## 2016-03-02 ENCOUNTER — Ambulatory Visit (INDEPENDENT_AMBULATORY_CARE_PROVIDER_SITE_OTHER): Payer: 59 | Admitting: Internal Medicine

## 2016-03-02 ENCOUNTER — Encounter: Payer: Self-pay | Admitting: Internal Medicine

## 2016-03-02 VITALS — BP 122/82 | HR 76 | Ht 62.0 in | Wt 196.0 lb

## 2016-03-02 DIAGNOSIS — E782 Mixed hyperlipidemia: Secondary | ICD-10-CM

## 2016-03-02 DIAGNOSIS — I2510 Atherosclerotic heart disease of native coronary artery without angina pectoris: Secondary | ICD-10-CM | POA: Diagnosis not present

## 2016-03-02 DIAGNOSIS — E669 Obesity, unspecified: Secondary | ICD-10-CM

## 2016-03-02 MED ORDER — EZETIMIBE 10 MG PO TABS: 10.0000 mg | ORAL_TABLET | Freq: Every day | ORAL | 3 refills | Status: DC

## 2016-03-02 MED FILL — EZETIMIBE 10 MG TABLET: 10 | 90 days supply | Qty: 90 | Fill #0

## 2016-03-02 NOTE — Patient Instructions (Signed)
Medication Instructions:   START ZETIA 10 MG ONCE DAILY  Labwork:  Your physician recommends that you return for lab work in: 3 MONTHS  Follow-Up:  Your physician wants you to follow-up in: 6 MONTHS WITH DR HILTY You will receive a reminder letter in the mail two months in advance. If you don't receive a letter, please call our office to schedule the follow-up appointment.   If you need a refill on your cardiac medications before your next appointment, please call your pharmacy.

## 2016-03-03 NOTE — Progress Notes (Signed)
Date:  03/03/2016   ID:  [name], [name] 06-Apr-1965, MRN [id number]  PCP:  Thora Lance, MD  Primary Cardiologist:  Jalene Demo    History of Present Illness: Colleen Dillon is a 50 y.o. female history of obesity, coronary artery disease, hyperlipidemia, hypertension, formerly followed by Dr. Alanda Amass - here to establish care with me today. Patient had acute DMI in 2006 with single vessel RCA disease with placement of overlapping bare metal stents. A recatheterization for repeat angina the same day and required subsequent DES stenting of the distal RCA and PLA branches peeling occluded at that time. Last catheterization was in 2010 showed 30-50% narrowing of the acute margin. The overlapping DES stents and IVUS interrogation showed good residual lumen. His chronic PDA occlusion with collaterals from the left. She ruled out for MI in July 2012 last nuclear stress test was 10/17/2010 showed no definite inducible or reversible ischemia the EF of 58%.  Last echo was April 2014 showed ejection fraction of 50-55% with normal wall motion. Is moderate mitral valve regurgitation. Currently works as a Engineer, civil (consulting) at [employer].   She last saw Wilburt Finlay, PA-C for symptoms of fatigue and chest pain this past summer and underwent a nuclear stress test on 10/02/2012 which was negative for ischemia. She exercised to 11.2 metabolic equivalents and had no chest pain. Overall she has done well and recently had a lipid profile which demonstrated LDL cholesterol of 122.  She was previously on Crestor 10 mg daily but was intolerant of this due to side effects including myalgias. She is also previously failed Zocor and lovastatin.  I saw Colleen Dillon back in the office today. She is doing well although she reports some morning headaches. She recently had some weight gain which she reports is due to stress eating. She's been under a lot of stress as her husband has some unusual neurologic disorder and her daughter  recently had surgery and is requiring attention and dressing changes. She denies any chest pain or worsening shortness of breath.  Colleen Dillon returns today for follow-up. She reports a recently she's had some worsening shortness of breath. She's had a walk a longer distance up a hill to get to work and notes that she feels more short of breath. She does exercise about 3 times a week, but generally does not do any significant incline on the treadmill. She has managed to lose little bit of weight recently due to dietary changes as her husband is at goal on a strict diet. She denies any chest pain. Her EKG is essentially unchanged with sinus rhythm and occasional PVCs.  03/02/2016  Colleen Dillon was seen today in follow-up. She reports no significant change in her shortness of breath. She has recently noticed an increase in her cholesterol. She has been intolerant to statins in the past. She was referred for evaluation of this. Last year her LDL was 105 with a particle number of 1121. Apparently this was higher recently from her primary care provider, however I do not have those results to review. She is requesting recommendations for treatment.  Wt Readings from Last 3 Encounters:  03/02/16 196 lb (88.9 kg)  06/30/15 196 lb (88.9 kg)  06/03/14 201 lb 12.8 oz (91.5 kg)     Past Medical History:  Diagnosis Date  . CAD (coronary artery disease)   . Cardiac murmur   . Chest pain   . Fatigue   . History of smoking   .  Hyperlipidemia   . Hypertension   . Obesity   . Old MI (myocardial infarction) 2006  . SOB (shortness of breath)     Current Outpatient Prescriptions  Medication Sig Dispense Refill  . acetaminophen (TYLENOL) 500 MG tablet Take 1,000 mg by mouth every 6 (six) hours as needed for headache.    Marland Kitchen. aspirin EC 81 MG tablet Take 162 mg by mouth every morning.     . clopidogrel (PLAVIX) 75 MG tablet TAKE 1 TABLET BY MOUTH EVERY MORNING. 90 tablet 3  . metoprolol tartrate (LOPRESSOR)  25 MG tablet Take 12.5 mg by mouth at bedtime.    . metoprolol tartrate (LOPRESSOR) 25 MG tablet Take 0.5 tablets (12.5 mg total) by mouth daily. <PLEASE MAKE APPOINTMENT FOR REFILLS> 15 tablet 1  . nitroGLYCERIN (NITROSTAT) 0.4 MG SL tablet Place 1 tablet (0.4 mg total) under the tongue every 5 (five) minutes as needed for chest pain. 25 tablet 3  . OVER THE COUNTER MEDICATION Take 10,000 Units by mouth every morning. Biotin 10,000 units    . pantoprazole (PROTONIX) 40 MG tablet Take 1 tablet (40 mg total) by mouth at bedtime. 90 tablet 1  . ezetimibe (ZETIA) 10 MG tablet Take 1 tablet (10 mg total) by mouth daily. 90 tablet 3   No current facility-administered medications for this visit.     Allergies:    Allergies  Allergen Reactions  . Crestor [Rosuvastatin]     Myalgia   . Lovastatin Other (See Comments)    Myalgias   . Niaspan [Niacin Er] Itching  . Penicillins Other (See Comments)    Child hood allergy  . Zocor [Simvastatin] Other (See Comments)    Myalgias        Social History:  The patient  reports that she has quit smoking. She quit smokeless tobacco use about 28 years ago. She reports that she does not drink alcohol or use drugs.    Family history: No family history of hypothyroidism  ROS:  Please see the history of present illness.  All other systems reviewed and negative.   PHYSICAL EXAM: VS:  BP 122/82   Pulse 76   Ht 5\' 2"  (1.575 m)   Wt 196 lb (88.9 kg)   BMI 35.85 kg/m  Well nourished, well developed, in no acute distress  HEENT: Pupils are equal round react to light accommodation extraocular movements are intact.  Neck: no JVD No cervical lymphadenopathy. Cardiac: Regular rate and rhythm without murmurs rubs or gallops.  Lungs:  clear to auscultation bilaterally, no wheezing, rhonchi or rales  Abd: soft, nontender, positive bowel sounds all quadrants, no hepatosplenomegaly  Ext: no lower extremity edema.  2+ radial and dorsalis pedis pulses. Skin:  warm and dry  Neuro:  Grossly normal, strength 5 out of 5 equal in upper and lower extremities  EKG:  Normal sinus rhythm at 76  ASSESSMENT AND PLAN:  Problem List Items Addressed This Visit    Coronary artery disease: - Primary   Relevant Medications   ezetimibe (ZETIA) 10 MG tablet   Other Relevant Orders   EKG 12-Lead   Comprehensive Metabolic Panel (CMET)   Lipid panel     PLAN: 1.  Colleen Dillon Has done well and is generally asymptomatic. Her weight is down a little bit. Blood pressure is well-controlled today. She has no worsening shortness of breath or chest pain. She is interested in better cholesterol control. I think she benefit from low-dose Zetia. Her cholesterol was only mildly elevated  last year on essentially no therapy. This is very favorable. Will repeat a lipid profile in about 2-3 months but she should likely be at goal therapy on low-dose Zetia if she tolerates it.  Chrystie NoseKenneth C. Sheniah Supak, MD, [facility]FACC Attending Cardiologist [facility]

## 2016-03-06 MED FILL — BUPROPION HCL XL 300 MG TAB: 300 | 90 days supply | Qty: 90 | Fill #0

## 2016-04-03 DIAGNOSIS — R55 Syncope and collapse: Secondary | ICD-10-CM

## 2016-04-03 DIAGNOSIS — Z8679 Personal history of other diseases of the circulatory system: Secondary | ICD-10-CM

## 2016-04-03 HISTORY — DX: Personal history of other diseases of the circulatory system: Z86.79

## 2016-04-03 HISTORY — DX: Syncope and collapse: R55

## 2016-05-18 MED FILL — CLOPIDOGREL 75 MG TABLET: 75 | 90 days supply | Qty: 90 | Fill #2

## 2016-07-12 MED FILL — BUPROPION HCL XL 300 MG TAB: 300 | 90 days supply | Qty: 90 | Fill #1

## 2016-07-19 DIAGNOSIS — R454 Irritability and anger: Secondary | ICD-10-CM | POA: Diagnosis not present

## 2016-07-19 DIAGNOSIS — I2510 Atherosclerotic heart disease of native coronary artery without angina pectoris: Secondary | ICD-10-CM | POA: Diagnosis not present

## 2016-07-23 DIAGNOSIS — I10 Essential (primary) hypertension: Secondary | ICD-10-CM | POA: Insufficient documentation

## 2016-07-23 NOTE — Progress Notes (Signed)
Cardiology Office Note:    Date:  07/24/2016   ID:  MCKELL RIECKE, DOB 1966-03-03, MRN [id number]  PCP:  Thora Lance, MD  Cardiologist:  Dr. K. [place] Hilty   Electrophysiologist:  n/a  Referring MD: Blair Heys, MD   Chief Complaint  Patient presents with  . Near Syncope    assoc with palpitations    History of Present Illness:    Colleen Dillon is a 51 y.o. female with a hx of CAD s/p prior inferior MI tx with BMS to RCA followed by DES to RCA in 2006, HTN, HL.  Myoview in 2014 was low risk.  Last seen by Dr. K. [place] Hilty in 11/17.    She is seen today in the [facility] office For the evaluation of palpitations and near syncope. She's had 3 episodes since late last week. A couple of episodes occurred while she was standing. One occurred while she was seated. She had onset of rapid palpitations that lasted for several seconds followed by near syncope. She denies frank syncope. She did note assoc diaphoresis.  She did have to lower herself to the ground on 2 occasions. She denies any chest discomfort. She denies exertional chest pain. She has chronic dyspnea with exertion that is unchanged. She denies orthopnea, PND or significant edema.  Prior CV studies:   The following studies were reviewed today:  Echo 5/15 EF 60-65, no RWMA, normal diastolic function, mild MR  Myoview 7/14 Normal stress nuclear study. and Low risk stress nuclear study with no evidence of ischemia or infarction.  Echo 4/14 Mild LVH, EF 50-55, no RWMA, normal diastolic fxn, mod MR  CPX 07/14/15 Conclusion: Exercise testing with gas exchange demonstrates normal functional capacity when compared to matched sedentary norms. There is no evidence of a significant ventilatory or circulatory limitation.   LHC 11/2008 EF > 50, inf HK/AK RCA stents patent; 30-50; FFR ok PDA occluded  Past Medical History:  Diagnosis Date  . CAD (coronary artery disease)   . Cardiac murmur   . Chest pain   .  Fatigue   . History of smoking   . Hyperlipidemia   . Hypertension   . Obesity   . Old MI (myocardial infarction) 2006  . SOB (shortness of breath)     Past Surgical History:  Procedure Laterality Date  . ABDOMINAL HYSTERECTOMY  2002  . CARDIAC CATHETERIZATION  05/18/2004   90% RCA stenosis, stented w/ a [employer] Liberte 5.0x19mm bare metal stent - postdilated at 10atmx10sec, stented w/ a second [employer] Liberte 4.5x5mm bare metal stent at 15atmx30sec, resulting in TIMI III flow.  Marland Kitchen CARDIAC CATHETERIZATION  09/08/2004   No intervention - continue medical therapy  . CARDIAC CATHETERIZATION  03/21/2005   Distal RCA 50-60% in-stent restenosis, stented w/ a DES Scimed Taxus Express II 3.5x86mm stent - postdilated at 20atmx31sec, resulting in TIMI III flow-60% stenosis reduced to 0-10%  . CARDIAC CATHETERIZATION  05/23/2005   No intervention - continue medical therapy  . CARDIAC CATHETERIZATION  11/02/2008   No intervention - continue medical therapy  . CARDIAC SURGERY  2006   stent placement  . CARDIOVASCULAR STRESS TEST  10/02/2012   No significant ST segment change suggestive of ischemia  . CESAREAN SECTION  1987  . TRANSTHORACIC ECHOCARDIOGRAM  07/30/2012   EF 50-55%, moderate mitral regurg  . TUBAL LIGATION      Current Medications: Current Meds  Medication Sig  . acetaminophen (TYLENOL) 500 MG tablet Take 1,000  mg by mouth every 6 (six) hours as needed for headache.  Marland Kitchen aspirin EC 81 MG tablet Take 162 mg by mouth every morning.   Marland Kitchen buPROPion (WELLBUTRIN XL) 300 MG 24 hr tablet Take 300 mg by mouth daily.  . clopidogrel (PLAVIX) 75 MG tablet TAKE 1 TABLET BY MOUTH EVERY MORNING.  . nitroGLYCERIN (NITROSTAT) 0.4 MG SL tablet Place 1 tablet (0.4 mg total) under the tongue every 5 (five) minutes as needed for chest pain.  Marland Kitchen OVER THE COUNTER MEDICATION Take 10,000 Units by mouth every morning. Biotin 10,000 units  . pantoprazole (PROTONIX) 40 MG tablet Take 1 tablet (40  mg total) by mouth at bedtime.  . [DISCONTINUED] metoprolol tartrate (LOPRESSOR) 25 MG tablet Take 12.5 mg by mouth at bedtime.     Allergies:   Crestor [rosuvastatin]; Lovastatin; Niaspan [niacin er]; Penicillins; and Zocor [simvastatin]   Social History   Social History  . Marital status: Married    Spouse name: N/A  . Number of children: N/A  . Years of education: N/A   Social History Main Topics  . Smoking status: Former Games developer  . Smokeless tobacco: Former Neurosurgeon    Quit date: 04/03/1987  . Alcohol use No  . Drug use: No  . Sexual activity: Not Asked   Other Topics Concern  . None   Social History Narrative  . None     Family History  Problem Relation Age of Onset  . Ulcers Father 40    Peptic ulcers  . Cancer Father 69  . Hypertension Brother 35  . Heart disease Maternal Grandfather 55  . Hypertension Brother 34  . Aneurysm Mother 60    Dissected  . Hypertension Mother 63  . Arthritis Mother 27    Rheumatoid arthritis     ROS:   Please see the history of present illness.    ROS All other systems reviewed and are negative.   EKGs/Labs/Other Test Reviewed:    EKG:  EKG is  ordered today.  The ekg ordered today demonstrates NSR, HR 73, normal axis, QTc 427 ms, no change from prior tracing.  Recent Labs: No results found for requested labs within last 8760 hours.   Recent Lipid Panel    Component Value Date/Time   CHOL 178 06/18/2014 0000   TRIG 85 06/18/2014 0000   HDL 56 06/18/2014 0000   CHOLHDL 3.5 08/15/2012 1130   VLDL 18 08/15/2012 1130   LDLCALC 105 (H) 06/18/2014 0000     Physical Exam:    VS:  BP 123/83 (Cuff Size: Normal)   Pulse 66   Ht  (1.575 m)   Wt 196 lb 12.8 oz (89.3 kg)   BMI 36.00 kg/m     Orthostatic VS for the past 24 hrs (Last 3 readings):  BP- Lying Pulse- Lying BP- Sitting Pulse- Sitting BP- Standing at 0 minutes Pulse- Standing at 0 minutes BP- Standing at 3 minutes Pulse- Standing at 3 minutes  07/24/16 0851  123/83 66 129/82 70 120/82 68 120/78 76     Wt Readings from Last 3 Encounters:  07/24/16 196 lb 12.8 oz (89.3 kg)  03/02/16 196 lb (88.9 kg)  06/30/15 196 lb (88.9 kg)     Physical Exam  Constitutional: She is oriented to person, place, and time. She appears well-developed and well-nourished. No distress.  HENT:  Head: Normocephalic and atraumatic.  Eyes: No scleral icterus.  Neck: Normal range of motion. No JVD present. Carotid bruit is not present.  Cardiovascular: Normal rate, regular rhythm, S1 normal, S2 normal and normal heart sounds.   No murmur heard. Pulmonary/Chest: Breath sounds normal. She has no wheezes. She has no rhonchi. She has no rales.  Abdominal: Soft. There is no tenderness.  Musculoskeletal: She exhibits no edema.  Neurological: She is alert and oriented to person, place, and time.  Skin: Skin is warm and dry.  Psychiatric: She has a normal mood and affect.    ASSESSMENT:    1. Near syncope   2. Coronary artery disease involving native coronary artery of native heart without angina pectoris   3. Hyperlipidemia, unspecified hyperlipidemia type    PLAN:    In order of problems listed above:  1. Near syncope - She has had 3 episodes of palpitations assoc with near syncope.  I am concerned that she is describing post termination pauses which raises the possibility of parox AFib.  She denies chest pain.  She has not had any recent illnesses.  She has not started any new medications.  She denies any bleeding issues.  She typically has lower BPs and this is not unusual for her.  Orthostatic VS today are normal.   -  Arrange Echo  -  Arrange Event Monitor  -  BMET, CBC   -  DC Metoprolol; she may take prn for palpitations   -  As she has not had frank syncope, driving does not need to be restricted.  I have asked her to use caution.  -  FU with Dr. Kirtland Bouchard. [place] Hilty in 3-4 weeks  2. Coronary artery disease involving native coronary artery of native heart without  angina pectoris - No angina.  Her ECG is unchanged.  Will get Echo and the event monitor to start.  At this point, I do not think she needs an ischemic evaluation unless her EF is down or she has NSVT on her monitor.  Continue aspirin, Plavix. She is intolerant to statins.  3. Hyperlipidemia, unspecified hyperlipidemia type - She is intolerant to statins.  Dispo:  Return in about 4 weeks (around 08/21/2016) for Close Follow Up with Dr. Rennis Golden.   Medication Adjustments/Labs and Tests Ordered: Current medicines are reviewed at length with the patient today.  Concerns regarding medicines are outlined above.   Orders Placed This Encounter  Procedures  . Basic Metabolic Panel (BMET)  . CBC  . Cardiac event monitor  . EKG 12-Lead  . ECHOCARDIOGRAM COMPLETE   Meds ordered this encounter  Medications  . metoprolol tartrate (LOPRESSOR) 25 MG tablet    Sig: Take 1 tablet (25 mg total) by mouth as needed (FOR PALPITATIONS).    Signed, Tereso Newcomer, PA-C  07/24/2016 8:55 AM    [facility] [address] [place], [place], [place]  [address] Phone: [phone]; Fax: [phone]

## 2016-07-24 ENCOUNTER — Telehealth: Payer: Self-pay | Admitting: *Deleted

## 2016-07-24 ENCOUNTER — Ambulatory Visit (INDEPENDENT_AMBULATORY_CARE_PROVIDER_SITE_OTHER): Payer: 59 | Admitting: Physician Assistant

## 2016-07-24 ENCOUNTER — Encounter: Payer: Self-pay | Admitting: Physician Assistant

## 2016-07-24 VITALS — BP 123/83 | HR 66 | Ht 62.0 in | Wt 196.8 lb

## 2016-07-24 DIAGNOSIS — R55 Syncope and collapse: Secondary | ICD-10-CM

## 2016-07-24 DIAGNOSIS — E785 Hyperlipidemia, unspecified: Secondary | ICD-10-CM

## 2016-07-24 DIAGNOSIS — I2510 Atherosclerotic heart disease of native coronary artery without angina pectoris: Secondary | ICD-10-CM

## 2016-07-24 LAB — BASIC METABOLIC PANEL
BUN / CREAT RATIO: 10 (ref 9–23)
BUN: 9 mg/dL (ref 6–24)
CALCIUM: 9.6 mg/dL (ref 8.7–10.2)
CHLORIDE: 104 mmol/L (ref 96–106)
CO2: 26 mmol/L (ref 18–29)
Creatinine, Ser: 0.89 mg/dL (ref 0.57–1.00)
GFR, EST AFRICAN AMERICAN: 86 mL/min/{1.73_m2} (ref >59)
GFR, EST NON AFRICAN AMERICAN: 74 mL/min/{1.73_m2} (ref >59)
Glucose: 92 mg/dL (ref 65–99)
POTASSIUM: 4.4 mmol/L (ref 3.5–5.2)
SODIUM: 144 mmol/L (ref 134–144)

## 2016-07-24 LAB — CBC
Hematocrit: 39.1 % (ref 34.0–46.6)
Hemoglobin: 13 g/dL (ref 11.1–15.9)
MCH: 29.9 pg (ref 26.6–33.0)
MCHC: 33.2 g/dL (ref 31.5–35.7)
MCV: 90 fL (ref 79–97)
PLATELETS: 307 10*3/uL (ref 150–379)
RBC: 4.35 x10E6/uL (ref 3.77–5.28)
RDW: 13.1 % (ref 12.3–15.4)
WBC: 4.6 10*3/uL (ref 3.4–10.8)

## 2016-07-24 MED ORDER — METOPROLOL TARTRATE 25 MG PO TABS: 25.0000 mg | ORAL_TABLET | ORAL | Status: DC | PRN

## 2016-07-24 NOTE — Patient Instructions (Addendum)
Medication Instructions:  1. CHANGE TAKING THE METOPROLOL TARTRATE 12.5 MG TO ONLY AS NEEDED FOR PALPITATIONS  Labwork: 1. TODAY BMET, CBC   Testing/Procedures: 1. Your physician has requested that you have an echocardiogram. Echocardiography is a painless test that uses sound waves to create images of your heart. It provides your doctor with information about the size and shape of your heart and how well your heart's chambers and valves are working. This procedure takes approximately one hour. There are no restrictions for this procedure.  2. Your physician has recommended that you wear an event monitor. Event monitors are medical devices that record the heart's electrical activity. Doctors most often [place] these monitors to diagnose arrhythmias. Arrhythmias are problems with the speed or rhythm of the heartbeat. The monitor is a small, portable device. You can wear one while you do your normal daily activities. This is usually used to diagnose what is causing palpitations/syncope (passing out).  Follow-Up: DR. HILTY IN 3-4 WEEKS OR ONE OF HIS CARE TEAM MEMBERS SAME DAY DR. HILTY IS IN THE OFFICE PER SCOTT WEAVER, PAC   Any Other Special Instructions Will Be Listed Below (If Applicable).  If you need a refill on your cardiac medications before your next appointment, please call your pharmacy.

## 2016-07-24 NOTE — Telephone Encounter (Signed)
Pt notified of lab results by phone with verbal understanding. Pt thanked me for my call today.  

## 2016-07-24 NOTE — Telephone Encounter (Signed)
-----   Message from Beatrice Lecher, [place] sent at 07/24/2016  4:53 PM EDT ----- Please call patient: The kidney function (BUN, Creatinine) and potassium are normal. The hemoglobin is normal.  All other parameters are within acceptable limits and no further intervention or testing required. Continue with current treatment plan. Tereso Newcomer, PA-C    07/24/2016 4:52 PM

## 2016-07-27 ENCOUNTER — Ambulatory Visit (INDEPENDENT_AMBULATORY_CARE_PROVIDER_SITE_OTHER): Payer: 59

## 2016-07-27 DIAGNOSIS — R55 Syncope and collapse: Secondary | ICD-10-CM | POA: Diagnosis not present

## 2016-07-29 DIAGNOSIS — R55 Syncope and collapse: Secondary | ICD-10-CM | POA: Diagnosis not present

## 2016-08-07 ENCOUNTER — Other Ambulatory Visit (HOSPITAL_COMMUNITY): Payer: 59

## 2016-08-08 ENCOUNTER — Ambulatory Visit (HOSPITAL_COMMUNITY): Payer: 59 | Attending: Internal Medicine

## 2016-08-08 ENCOUNTER — Other Ambulatory Visit: Payer: Self-pay

## 2016-08-08 ENCOUNTER — Encounter: Payer: Self-pay | Admitting: Physician Assistant

## 2016-08-08 ENCOUNTER — Telehealth: Payer: Self-pay | Admitting: *Deleted

## 2016-08-08 DIAGNOSIS — Z87891 Personal history of nicotine dependence: Secondary | ICD-10-CM | POA: Diagnosis not present

## 2016-08-08 DIAGNOSIS — I2510 Atherosclerotic heart disease of native coronary artery without angina pectoris: Secondary | ICD-10-CM | POA: Diagnosis not present

## 2016-08-08 DIAGNOSIS — R55 Syncope and collapse: Secondary | ICD-10-CM | POA: Diagnosis not present

## 2016-08-08 DIAGNOSIS — E785 Hyperlipidemia, unspecified: Secondary | ICD-10-CM | POA: Diagnosis not present

## 2016-08-08 NOTE — Telephone Encounter (Signed)
-----   Message from Beatrice LecherScott T Weaver, [place] sent at 08/08/2016  5:17 PM EDT ----- Please call the patient. The echocardiogram shows normal heart function (ejection fraction) and no significant valvular abnormalities.  Continue current treatment plan.  Please fax a copy of this study result to her PCP:  Blair HeysEhinger, Robert, MD  Thanks! Tereso NewcomerScott Weaver, PA-C    08/08/2016 5:15 PM

## 2016-08-08 NOTE — Telephone Encounter (Signed)
Lmtcb to go over echo results.  

## 2016-08-15 ENCOUNTER — Ambulatory Visit (INDEPENDENT_AMBULATORY_CARE_PROVIDER_SITE_OTHER): Payer: 59 | Admitting: Internal Medicine

## 2016-08-15 ENCOUNTER — Encounter: Payer: Self-pay | Admitting: Internal Medicine

## 2016-08-15 ENCOUNTER — Telehealth: Payer: Self-pay | Admitting: *Deleted

## 2016-08-15 VITALS — BP 136/88 | HR 84 | Ht 62.0 in | Wt 196.8 lb

## 2016-08-15 DIAGNOSIS — I471 Supraventricular tachycardia: Secondary | ICD-10-CM | POA: Diagnosis not present

## 2016-08-15 DIAGNOSIS — I2510 Atherosclerotic heart disease of native coronary artery without angina pectoris: Secondary | ICD-10-CM

## 2016-08-15 DIAGNOSIS — E669 Obesity, unspecified: Secondary | ICD-10-CM | POA: Diagnosis not present

## 2016-08-15 DIAGNOSIS — E782 Mixed hyperlipidemia: Secondary | ICD-10-CM | POA: Diagnosis not present

## 2016-08-15 DIAGNOSIS — R55 Syncope and collapse: Secondary | ICD-10-CM | POA: Diagnosis not present

## 2016-08-15 MED ORDER — NITROGLYCERIN 0.4 MG SL SUBL: 0.4000 mg | SUBLINGUAL_TABLET | SUBLINGUAL | 3 refills | Status: DC | PRN

## 2016-08-15 MED ORDER — METOPROLOL TARTRATE 25 MG PO TABS: 12.5000 mg | ORAL_TABLET | Freq: Two times a day (BID) | ORAL | 5 refills | Status: DC

## 2016-08-15 MED FILL — NITROGLYCERIN 0.4 MG TAB SL: 0.4 | 10 days supply | Qty: 25 | Fill #0

## 2016-08-15 MED FILL — METOPROLOL TARTRATE 25 MG T: 25 | 30 days supply | Qty: 30 | Fill #0

## 2016-08-15 NOTE — Telephone Encounter (Signed)
-----   Message from Scott T Weaver, PA-C sent at 08/08/2016  5:17 PM EDT ----- Please call the patient. The echocardiogram shows normal heart function (ejection fraction) and no significant valvular abnormalities.  Continue current treatment plan.  Please fax a copy of this study result to her PCP:  Ehinger, Robert, MD  Thanks! Scott Weaver, PA-C    08/08/2016 5:15 PM 

## 2016-08-15 NOTE — Telephone Encounter (Signed)
Lmtcb x 2 to go over echo results . Pt had appt today with Dr. Rennis GoldenHilty.

## 2016-08-15 NOTE — Patient Instructions (Signed)
Your physician has recommended you make the following change in your medication:  -- START TAKING metoprolol tartrate 12.5mg  twice daily  Your physician recommends that you schedule a follow-up appointment in: THREE MONTHS with Dr. Rennis GoldenHilty.

## 2016-08-15 NOTE — Progress Notes (Signed)
Date:  08/15/2016   ID:  Anaysha, Andre 10/16/1965, MRN [id number]  PCP:  Blair Heys, MD  Primary Cardiologist:  [facility]  CC: Palpitations, no further pre-syncopal epidoses   History of Present Illness: Colleen Dillon is a 51 y.o. female history of obesity, coronary artery disease, hyperlipidemia, hypertension, formerly followed by Dr. Alanda Amass - here to establish care with me today. Patient had acute DMI in 2006 with single vessel RCA disease with placement of overlapping bare metal stents. A recatheterization for repeat angina the same day and required subsequent DES stenting of the distal RCA and PLA branches peeling occluded at that time. Last catheterization was in 2010 showed 30-50% narrowing of the acute margin. The overlapping DES stents and IVUS interrogation showed good residual lumen. His chronic PDA occlusion with collaterals from the left. She ruled out for MI in July 2012 last nuclear stress test was 10/17/2010 showed no definite inducible or reversible ischemia the EF of 58%.  Last echo was April 2014 showed ejection fraction of 50-55% with normal wall motion. Is moderate mitral valve regurgitation. Currently works as a Engineer, civil (consulting) at [employer].   She last saw Wilburt Finlay, PA-C for symptoms of fatigue and chest pain this past summer and underwent a nuclear stress test on 10/02/2012 which was negative for ischemia. She exercised to 11.2 metabolic equivalents and had no chest pain. Overall she has done well and recently had a lipid profile which demonstrated LDL cholesterol of 122.  She was previously on Crestor 10 mg daily but was intolerant of this due to side effects including myalgias. She is also previously failed Zocor and lovastatin.  I saw Colleen Dillon back in the office today. She is doing well although she reports some morning headaches. She recently had some weight gain which she reports is due to stress eating. She's been under a lot of stress as her husband has  some unusual neurologic disorder and her daughter recently had surgery and is requiring attention and dressing changes. She denies any chest pain or worsening shortness of breath.  Colleen Dillon returns today for follow-up. She reports a recently she's had some worsening shortness of breath. She's had a walk a longer distance up a hill to get to work and notes that she feels more short of breath. She does exercise about 3 times a week, but generally does not do any significant incline on the treadmill. She has managed to lose little bit of weight recently due to dietary changes as her husband is at goal on a strict diet. She denies any chest pain. Her EKG is essentially unchanged with sinus rhythm and occasional PVCs.  03/02/2016  Colleen Dillon was seen today in follow-up. She reports no significant change in her shortness of breath. She has recently noticed an increase in her cholesterol. She has been intolerant to statins in the past. She was referred for evaluation of this. Last year her LDL was 105 with a particle number of 1121. Apparently this was higher recently from her primary care provider, however I do not have those results to review. She is requesting recommendations for treatment.  08/15/2016  Colleen Dillon returns today for follow-up. She recently saw Tereso Newcomer, PA-C for near syncope. She had 2 episodes of rapid heart rate associated with graying out of her vision in one time she fell down to her knees and nearly passed out. 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 Scott recommended echocardiogram which showed normal systolic function and no significant structural heart disease. She was also placed on a monitor which she is currently wearing. I personally reviewed up loads from that monitor indicating several episodes of paroxysmal supraventricular tachycardia. Most of these episodes lasted less than 10 beats but likely  represent the episode she had that caused her to be presyncopal. In the past she's been using low-dose metoprolol as needed but has not taken any recently.  Wt Readings from Last 3 Encounters:  08/15/16 196 lb 12.8 oz (89.3 kg)  07/24/16 196 lb 12.8 oz (89.3 kg)  03/02/16 196 lb (88.9 kg)     Past Medical History:  Diagnosis Date  . CAD (coronary artery disease)   . Cardiac murmur   . History of echocardiogram    Echo 5/18: EF 60-65, and normal wall motion, grade 1 diastolic dysfunction  . History of smoking   . Hyperlipidemia   . Hypertension   . Obesity   . Old MI (myocardial infarction) 2006    Current Outpatient Prescriptions  Medication Sig Dispense Refill  . acetaminophen (TYLENOL) 500 MG tablet Take 1,000 mg by mouth every 6 (six) hours as needed for headache.    Marland Kitchen. aspirin EC 81 MG tablet Take 162 mg by mouth every morning.     Marland Kitchen. buPROPion (WELLBUTRIN XL) 300 MG 24 hr tablet Take 300 mg by mouth daily.    . clopidogrel (PLAVIX) 75 MG tablet TAKE 1 TABLET BY MOUTH EVERY MORNING. 90 tablet 3  . metoprolol tartrate (LOPRESSOR) 25 MG tablet Take 0.5 tablets (12.5 mg total) by mouth 2 (two) times daily. 30 tablet 5  . nitroGLYCERIN (NITROSTAT) 0.4 MG SL tablet Place 1 tablet (0.4 mg total) under the tongue every 5 (five) minutes as needed for chest pain. 25 tablet 3  . OVER THE COUNTER MEDICATION Take 10,000 Units by mouth every morning. Biotin 10,000 units    . pantoprazole (PROTONIX) 40 MG tablet Take 1 tablet (40 mg total) by mouth at bedtime. 90 tablet 1   No current facility-administered medications for this visit.     Allergies:    Allergies  Allergen Reactions  . Crestor [Rosuvastatin]     Myalgia   . Lovastatin Other (See Comments)    Myalgias   . Niaspan [Niacin Er] Itching  . Penicillins Other (See Comments)    Child hood allergy  . Pitavastatin Other (See Comments)    Joints ache  . Zocor [Simvastatin] Other (See Comments)    Myalgias        Social  History:  The patient  reports that she has quit smoking. She quit smokeless tobacco use about 29 years ago. She reports that she does not drink alcohol or use drugs.    Family history: No syncope or sudden death  ROS:   Pertinent items noted in HPI and remainder of comprehensive ROS otherwise negative.  PHYSICAL EXAM: VS:  BP 136/88   Pulse 84   Ht 5\' 2"  (1.575 m)   Wt 196 lb 12.8 oz (89.3 kg)   BMI 36.00 kg/m  General appearance: alert and no distress Lungs: clear to auscultation bilaterally Heart: regular rate and rhythm, S1, S2 normal, no murmur, click, rub or gallop Extremities: extremities normal, atraumatic, no cyanosis or edema Neurologic: Grossly normal  EKG:  Deferred  ASSESSMENT: 1. SVT 2. Presyncope 3. CAD - Acute MI in 2006 with single vessel RCA disease, status post overlapping  BMS 4. Dyslipidemia 5. Moderate obesity  PLAN: 1.  Colleen Dillon was recently presyncopal. An echocardiogram was reassuring showing normal systolic function. She is having no chest pain. Her monitor indicates SVT without any evidence of VT or atrial fibrillation. She was not taking regular beta blocker and I recommend starting on metoprolol tartrate 12.5 mg twice a day. We'll have her continue her monitor for the remaining week and a half. Should she have further episodes on beta blocker she may need an electrophysiology evaluation.  Follow-up with me in 3 months.  [name], MD, [facility] Attending Cardiologist [facility]

## 2016-09-06 MED FILL — CLOPIDOGREL 75 MG TABLET: 75 | 90 days supply | Qty: 90 | Fill #3

## 2016-09-18 MED FILL — METOPROLOL TARTRATE 25 MG T: 25 | 30 days supply | Qty: 30 | Fill #1

## 2016-09-20 MED FILL — NITROFURANTOIN MONO-MCR 100: 100 | 7 days supply | Qty: 14 | Fill #0

## 2016-10-06 DIAGNOSIS — R310 Gross hematuria: Secondary | ICD-10-CM | POA: Diagnosis not present

## 2016-10-12 MED FILL — BUPROPION HCL XL 300 MG TAB: 300 | 90 days supply | Qty: 90 | Fill #0

## 2016-10-12 MED FILL — NITROGLYCERIN 0.4 MG TAB SL: 0.4 | 10 days supply | Qty: 25 | Fill #1

## 2016-10-12 MED FILL — METOPROLOL TARTRATE 25 MG T: 25 | 90 days supply | Qty: 90 | Fill #2

## 2016-10-17 DIAGNOSIS — R310 Gross hematuria: Secondary | ICD-10-CM | POA: Diagnosis not present

## 2016-10-17 DIAGNOSIS — R8279 Other abnormal findings on microbiological examination of urine: Secondary | ICD-10-CM | POA: Diagnosis not present

## 2016-10-17 DIAGNOSIS — N390 Urinary tract infection, site not specified: Secondary | ICD-10-CM | POA: Diagnosis not present

## 2016-10-17 DIAGNOSIS — B9620 Unspecified Escherichia coli [E. coli] as the cause of diseases classified elsewhere: Secondary | ICD-10-CM | POA: Diagnosis not present

## 2016-10-17 MED FILL — NITROFURANTOIN MCR 100 MG C: 100 | 7 days supply | Qty: 14 | Fill #0

## 2016-11-20 ENCOUNTER — Ambulatory Visit (INDEPENDENT_AMBULATORY_CARE_PROVIDER_SITE_OTHER): Payer: 59 | Admitting: Internal Medicine

## 2016-11-20 ENCOUNTER — Encounter: Payer: Self-pay | Admitting: Internal Medicine

## 2016-11-20 VITALS — BP 118/86 | HR 59 | Ht 62.0 in | Wt 198.0 lb

## 2016-11-20 DIAGNOSIS — I2510 Atherosclerotic heart disease of native coronary artery without angina pectoris: Secondary | ICD-10-CM | POA: Diagnosis not present

## 2016-11-20 DIAGNOSIS — I471 Supraventricular tachycardia: Secondary | ICD-10-CM | POA: Diagnosis not present

## 2016-11-20 DIAGNOSIS — E782 Mixed hyperlipidemia: Secondary | ICD-10-CM

## 2016-11-20 NOTE — Progress Notes (Signed)
Date:  11/20/2016   ID:  Colleen, Dillon 10-11-1965, MRN [id number]  PCP:  Colleen Heys, MD  Primary Cardiologist:  Colleen Dillon  CC: No complaints  History of Present Illness: Colleen Dillon is a 51 y.o. female history of obesity, coronary artery disease, hyperlipidemia, hypertension, formerly followed by Dr. Alanda Dillon - here to establish care with me today. Patient had acute DMI in 2006 with single vessel RCA disease with placement of overlapping bare metal stents. A recatheterization for repeat angina the same day and required subsequent DES stenting of the distal RCA and PLA branches peeling occluded at that time. Last catheterization was in 2010 showed 30-50% narrowing of the acute margin. The overlapping DES stents and IVUS interrogation showed good residual lumen. His chronic PDA occlusion with collaterals from the left. She ruled out for MI in July 2012 last nuclear stress test was 10/17/2010 showed no definite inducible or reversible ischemia the EF of 58%.  Last echo was April 2014 showed ejection fraction of 50-55% with normal wall motion. Is moderate mitral valve regurgitation. Currently works as a Engineer, civil (consulting) at [employer].   She last saw Colleen Finlay, PA-C for symptoms of fatigue and chest pain this past summer and underwent a nuclear stress test on 10/02/2012 which was negative for ischemia. She exercised to 11.2 metabolic equivalents and had no chest pain. Overall she has done well and recently had a lipid profile which demonstrated LDL cholesterol of 122.  She was previously on Crestor 10 mg daily but was intolerant of this due to side effects including myalgias. She is also previously failed Zocor and lovastatin.  I saw Colleen Dillon back in the office today. She is doing well although she reports some morning headaches. She recently had some weight gain which she reports is due to stress eating. She's been under a lot of stress as her husband has some unusual neurologic disorder and  her daughter recently had surgery and is requiring attention and dressing changes. She denies any chest pain or worsening shortness of breath.  Colleen Dillon returns today for follow-up. She reports a recently she's had some worsening shortness of breath. She's had a walk a longer distance up a hill to get to work and notes that she feels more short of breath. She does exercise about 3 times a week, but generally does not do any significant incline on the treadmill. She has managed to lose little bit of weight recently due to dietary changes as her husband is at goal on a strict diet. She denies any chest pain. Her EKG is essentially unchanged with sinus rhythm and occasional PVCs.  03/02/2016  Colleen Dillon was seen today in follow-up. She reports no significant change in her shortness of breath. She has recently noticed an increase in her cholesterol. She has been intolerant to statins in the past. She was referred for evaluation of this. Last year her LDL was 105 with a particle number of 1121. Apparently this was higher recently from her primary care provider, however I do not have those results to review. She is requesting recommendations for treatment.  08/15/2016  Colleen Dillon returns today for follow-up. She recently saw Colleen Newcomer, PA-C for near syncope. She had 2 episodes of rapid heart rate associated with graying out of her vision in one time she fell down to her knees and nearly passed out. 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 Scott recommended echocardiogram which showed normal systolic function and no significant structural heart disease. She was also placed on a monitor which she is currently wearing. I personally reviewed up loads from that monitor indicating several episodes of paroxysmal supraventricular tachycardia. Most of these episodes lasted less than 10 beats but likely represent the episode she had that  caused her to be presyncopal. In the past she's been using low-dose metoprolol as needed but has not taken any recently.  11/20/2016  Colleen Dillon returns today for follow-up. Overall she seems to be doing well. Heart rate appears well controlled on beta blocker. She feels like her metoprolol is helped with her fast heart rates. She denies any chest pain or worsening shortness of breath. She does have a history of dyslipidemia and given her history of coronary disease should be on treatment for that. Unfortunately she's been intolerant to 4 different statins at different doses as well as niacin in the past. We discussed new medications including the PCSK9 inhibitors which she may be a candidate for. However, it is been several years since she has last had a lipid profile.  Wt Readings from Last 3 Encounters:  11/20/16 198 lb (89.8 kg)  08/15/16 196 lb 12.8 oz (89.3 kg)  07/24/16 196 lb 12.8 oz (89.3 kg)     Past Medical History:  Diagnosis Date  . CAD (coronary artery disease)   . Cardiac murmur   . History of echocardiogram    Echo 5/18: EF 60-65, and normal wall motion, grade 1 diastolic dysfunction  . History of smoking   . Hyperlipidemia   . Hypertension   . Obesity   . Old MI (myocardial infarction) 2006    Current Outpatient Prescriptions  Medication Sig Dispense Refill  . acetaminophen (TYLENOL) 500 MG tablet Take 1,000 mg by mouth every 6 (six) hours as needed for headache.    Marland Kitchen aspirin EC 81 MG tablet Take 162 mg by mouth every morning.     Marland Kitchen buPROPion (WELLBUTRIN XL) 300 MG 24 hr tablet Take 300 mg by mouth daily.    . clopidogrel (PLAVIX) 75 MG tablet TAKE 1 TABLET BY MOUTH EVERY MORNING. 90 tablet 3  . metoprolol tartrate (LOPRESSOR) 25 MG tablet Take 0.5 tablets (12.5 mg total) by mouth 2 (two) times daily. 30 tablet 5  . nitroGLYCERIN (NITROSTAT) 0.4 MG SL tablet Place 1 tablet (0.4 mg total) under the tongue every 5 (five) minutes as needed for chest pain. 25 tablet 3    . OVER THE COUNTER MEDICATION Take 10,000 Units by mouth every morning. Biotin 10,000 units    . pantoprazole (PROTONIX) 40 MG tablet Take 1 tablet (40 mg total) by mouth at bedtime. 90 tablet 1   No current facility-administered medications for this visit.     Allergies:    Allergies  Allergen Reactions  . Crestor [Rosuvastatin]     Myalgia   . Lovastatin Other (See Comments)    Myalgias   . Niaspan [Niacin Er] Itching  . Penicillins Other (See Comments)    Child hood allergy  . Pitavastatin Other (See Comments)    Joints ache  . Zocor [Simvastatin] Other (See Comments)    Myalgias        Social History:  The patient  reports that she has quit smoking. She quit smokeless tobacco use about 29 years ago. She reports that she does not drink alcohol or use drugs.    Family history: No significant cardiac history  ROS:   Pertinent items noted in HPI and remainder of comprehensive ROS otherwise negative.   PHYSICAL EXAM: VS:  BP 118/86   Pulse (!) 59   Ht 5\' 2"  (1.575 m)   Wt 198 lb (89.8 kg)   BMI 36.21 kg/m  General appearance: alert and no distress Neck: no carotid bruit, no JVD and thyroid not enlarged, symmetric, no tenderness/mass/nodules Lungs: clear to auscultation bilaterally Heart: regular rate and rhythm, S1, S2 normal, no murmur, click, rub or gallop Abdomen: soft, non-tender; bowel sounds normal; no masses,  no organomegaly Extremities: extremities normal, atraumatic, no cyanosis or edema Pulses: 2+ and symmetric Skin: Skin color, texture, turgor normal. No rashes or lesions Neurologic: Grossly normal Psych: Pleasant  EKG:  Sinus bradycardia 59-personally reviewed  ASSESSMENT: 1. SVT - improved on BB 2. Presyncope -resolved 3. CAD - Acute MI in 2006 with single vessel RCA disease, status post overlapping BMS 4. Dyslipidemia - statin intolerant 5. Moderate obesity  PLAN: 1.  Mrs. Hernan is had improvement on her SVT on a beta blocker. She's no  longer presyncopal. She does have a coronary artery disease history with bare metal stenting in 2006. Although she's done well, she is been intolerant to statins due to myalgias. I informed her she may be a candidate for PCSK9 inhibitor but would require repeat lipid profile to establish her targets. She is agreeable to this.  Follow-up with me annually.  [name], MD, [facility] Attending Cardiologist [facility]

## 2016-11-20 NOTE — Patient Instructions (Signed)
Medication Instructions:  Continue current medications  If you need a refill on your cardiac medications before your next appointment, please call your pharmacy.  Labwork: Fasting Lipids HERE IN OUR OFFICE AT [facility]  Testing/Procedures: None Ordered   Follow-Up: Your physician wants you to follow-up in: 1 Year. You should receive a reminder letter in the mail two months in advance. If you do not receive a letter, please call our office [phone].   Thank you for choosing [facility]!!      

## 2016-11-23 MED FILL — NITROFURANTOIN MCR 100 MG C: 100 | 5 days supply | Qty: 10 | Fill #0

## 2017-01-04 ENCOUNTER — Other Ambulatory Visit: Payer: Self-pay | Admitting: Internal Medicine

## 2017-01-05 MED FILL — CLOPIDOGREL 75 MG TABLET: 75 | 90 days supply | Qty: 90 | Fill #0

## 2017-01-06 DIAGNOSIS — H10013 Acute follicular conjunctivitis, bilateral: Secondary | ICD-10-CM | POA: Diagnosis not present

## 2017-01-09 MED FILL — PREDNISOLONE AC 1% EYE DROP: 1 | 7 days supply | Qty: 5 | Fill #0

## 2017-01-09 MED FILL — RESTASIS MULTIDOSE 0.05% EY: 0.05 | 30 days supply | Qty: 6 | Fill #0

## 2017-01-09 MED FILL — OLOPATADINE HCL 0.1 % SOLN: 0.1 | 25 days supply | Qty: 5 | Fill #0

## 2017-01-24 DIAGNOSIS — Z13220 Encounter for screening for lipoid disorders: Secondary | ICD-10-CM | POA: Diagnosis not present

## 2017-01-24 DIAGNOSIS — Z1231 Encounter for screening mammogram for malignant neoplasm of breast: Secondary | ICD-10-CM | POA: Diagnosis not present

## 2017-01-24 DIAGNOSIS — Z23 Encounter for immunization: Secondary | ICD-10-CM | POA: Diagnosis not present

## 2017-01-24 DIAGNOSIS — R454 Irritability and anger: Secondary | ICD-10-CM | POA: Diagnosis not present

## 2017-01-24 DIAGNOSIS — Z6837 Body mass index (BMI) 37.0-37.9, adult: Secondary | ICD-10-CM | POA: Diagnosis not present

## 2017-01-24 DIAGNOSIS — Z0000 Encounter for general adult medical examination without abnormal findings: Secondary | ICD-10-CM | POA: Diagnosis not present

## 2017-01-24 DIAGNOSIS — Z1211 Encounter for screening for malignant neoplasm of colon: Secondary | ICD-10-CM | POA: Diagnosis not present

## 2017-01-24 DIAGNOSIS — I2510 Atherosclerotic heart disease of native coronary artery without angina pectoris: Secondary | ICD-10-CM | POA: Diagnosis not present

## 2017-01-24 MED FILL — BUPROPION HCL XL 300 MG TAB: 300 | 90 days supply | Qty: 90 | Fill #0

## 2017-02-13 DIAGNOSIS — Z1211 Encounter for screening for malignant neoplasm of colon: Secondary | ICD-10-CM | POA: Diagnosis not present

## 2017-02-14 ENCOUNTER — Telehealth: Payer: Self-pay | Admitting: Internal Medicine

## 2017-02-14 NOTE — Telephone Encounter (Signed)
   [facility] Pre-operative Risk Assessment    Request for surgical clearance:  1. What type of surgery is being performed? Colonoscopy - screening    2. When is this surgery scheduled? 03/30/17   3. Are there any medications that need to be held prior to surgery and how long? Plavix   4. Practice name and name of physician performing surgery? Dr. Michail Sermon @ [facility]    5. What is your office phone and fax number? (p) [phone]  (f) [phone]   6. Anesthesia type (None, local, MAC, general) ? Not specified    Colleen Dillon 02/14/2017, 9:46 AM  _________________________________________________________________   (provider comments below)

## 2017-02-14 NOTE — Telephone Encounter (Signed)
    Chart reviewed as part of pre-operative protocol coverage. Patient was contacted 02/14/2017 in reference to pre-operative risk assessment for pending surgery as outlined below.  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 Rennis Dillon.  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 DASI is 8.97 METS.   Therefore, based on ACC/AHA guidelines, the patient would be at acceptable risk for the planned procedure without further cardiovascular testing.   Patient is taking Plavix 75mg  qd and ASA 162mg  QD. Will route to Dr. Rennis Dillon to review.    Colleen Dillon,Colleen Wurster, PA 02/14/2017, 2:51 PM

## 2017-02-14 NOTE — Telephone Encounter (Signed)
Ok to hold plavix 5 days and aspirin 7 days prior to colonoscopy and resume after.  Dr. HRexene Edison

## 2017-02-15 MED FILL — MOXIFLOXACIN HCL 0.5 % SOLN: 0.5 | 15 days supply | Qty: 3 | Fill #0

## 2017-02-20 DIAGNOSIS — H04429 Chronic lacrimal canaliculitis of unspecified lacrimal passage: Secondary | ICD-10-CM | POA: Diagnosis not present

## 2017-02-20 DIAGNOSIS — H00036 Abscess of eyelid left eye, unspecified eyelid: Secondary | ICD-10-CM | POA: Diagnosis not present

## 2017-02-28 DIAGNOSIS — H04429 Chronic lacrimal canaliculitis of unspecified lacrimal passage: Secondary | ICD-10-CM | POA: Diagnosis not present

## 2017-02-28 DIAGNOSIS — H00036 Abscess of eyelid left eye, unspecified eyelid: Secondary | ICD-10-CM | POA: Diagnosis not present

## 2017-03-28 MED FILL — GAVILYTE-N SOLUTION: 420 | 2 days supply | Qty: 4000 | Fill #0

## 2017-03-30 DIAGNOSIS — K640 First degree hemorrhoids: Secondary | ICD-10-CM | POA: Diagnosis not present

## 2017-03-30 DIAGNOSIS — Z1211 Encounter for screening for malignant neoplasm of colon: Secondary | ICD-10-CM | POA: Diagnosis not present

## 2017-04-02 ENCOUNTER — Ambulatory Visit: Payer: 59 | Admitting: Skilled Nursing Facility1

## 2017-04-12 ENCOUNTER — Encounter: Payer: Self-pay | Admitting: Skilled Nursing Facility1

## 2017-04-12 ENCOUNTER — Encounter: Payer: 59 | Attending: Family Medicine | Admitting: Skilled Nursing Facility1

## 2017-04-12 DIAGNOSIS — R7303 Prediabetes: Secondary | ICD-10-CM | POA: Diagnosis not present

## 2017-04-12 DIAGNOSIS — Z6836 Body mass index (BMI) 36.0-36.9, adult: Secondary | ICD-10-CM | POA: Diagnosis not present

## 2017-04-12 DIAGNOSIS — E669 Obesity, unspecified: Secondary | ICD-10-CM | POA: Diagnosis not present

## 2017-04-12 NOTE — Patient Instructions (Signed)
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

## 2017-04-12 NOTE — Progress Notes (Signed)
  Assessment:  Primary concerns today: weight loss. Pt states she would like to lose weight and eat healthier. Pt states she has tried a few times before to lose weight: slim fast and shakes and taking appetite suppressants. Pt states her husbands is allergic to mammal. Pt states she is not at her usual weight stating was 140 pounds is her usual weight (not in recent years). Pt state she tosses and turns all night long with no sleep stating it is related to work stress.   MEDICATIONS: See List   DIETARY INTAKE:  Usual eating pattern includes 3 meals and 1 snacks per day.  Everyday foods include none stated.  Avoided foods include none stated.    24-hr recall: 4 meals a week eating out B ( AM): 4 chicken tenders Snk ( AM):   L ( PM): peanut butter crackers or subway sandwich or pizza Snk ( PM):  D ( PM): ice cream and cheese crackers Snk ( PM): ice cream and cheese crackers Beverages: 18-20 oz diet sun drop, 20 ounces water  Usual physical activity: ADL's  Estimated energy needs: 1600 calories 180 g carbohydrates 120 g protein 44 g fat  Progress Towards Goal(s):  In progress.    Intervention:  Nutrition counseling for a healthy diet. 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

## 2017-04-18 ENCOUNTER — Encounter (HOSPITAL_BASED_OUTPATIENT_CLINIC_OR_DEPARTMENT_OTHER): Payer: Self-pay

## 2017-04-18 ENCOUNTER — Other Ambulatory Visit: Payer: Self-pay

## 2017-04-18 NOTE — Progress Notes (Signed)
Spoke with: Colleen Dillon NPO: After Midnight Arrival time: 7:30AM Labs: Istat 4 AM medications: Bupropion, Metoprolol Pre op orders: No Ride home: Colleen RuizJohn (husband) [phone]

## 2017-04-18 NOTE — Pre-Procedure Instructions (Signed)
Spoke with Lady at Dr. Spencer's office to request pre op orders be placed in epic. 

## 2017-04-21 NOTE — H&P (Signed)
Colleen Dillon is an 52 y.o. female.   Chief Complaint: Chronic lacrimal cannaliculus inflammation and pain os. HPI: 52 y/o WF c chronic punctitis and cannuliculitis os s/p placement of punctal plugs ; Pt presents for elective probing and irrigation of the nasolacrimal system os c balloon canaliculoplasty under general anesthesia.  Past Medical History:  Diagnosis Date  . Anxiety   . CAD (coronary artery disease)   . Cardiac murmur   . Depression   . GERD (gastroesophageal reflux disease)   . History of echocardiogram    Echo 5/18: EF 60-65, and normal wall motion, grade 1 diastolic dysfunction  . History of PSVT (paroxysmal supraventricular tachycardia) 2018   monitor   . History of smoking   . Hyperlipidemia   . Obesity   . Old MI (myocardial infarction) 2006  . Pre-syncope 2018    Past Surgical History:  Procedure Laterality Date  . ABDOMINAL HYSTERECTOMY  2002  . CARDIAC CATHETERIZATION  05/18/2004   90% RCA stenosis, stented w/ a [employer] Liberte 5.0x71mm bare metal stent - postdilated at 10atmx10sec, stented w/ a second [employer] Liberte 4.5x73mm bare metal stent at 15atmx30sec, resulting in TIMI III flow.  Marland Kitchen CARDIAC CATHETERIZATION  09/08/2004   No intervention - continue medical therapy  . CARDIAC CATHETERIZATION  03/21/2005   Distal RCA 50-60% in-stent restenosis, stented w/ a DES Scimed Taxus Express II 3.5x70mm stent - postdilated at 20atmx31sec, resulting in TIMI III flow-60% stenosis reduced to 0-10%  . CARDIAC CATHETERIZATION  05/23/2005   No intervention - continue medical therapy  . CARDIAC CATHETERIZATION  11/02/2008   No intervention - continue medical therapy  . CARDIAC SURGERY  2006   stent placement  . CARDIOVASCULAR STRESS TEST  10/02/2012   No significant ST segment change suggestive of ischemia  . CESAREAN SECTION  1987  . TRANSTHORACIC ECHOCARDIOGRAM  07/30/2012   EF 50-55%, moderate mitral regurg  . TUBAL LIGATION      Family History    Problem Relation Age of Onset  . Ulcers Father 40       Peptic ulcers  . Cancer Father 48  . Hypertension Brother 35  . Heart disease Maternal Grandfather 55  . Hypertension Brother 34  . Aneurysm Mother 60       Dissected  . Hypertension Mother 46  . Arthritis Mother 30       Rheumatoid arthritis   Social History:  reports that she has quit smoking. She quit smokeless tobacco use about 30 years ago. She reports that she does not drink alcohol or use drugs.  Allergies:  Allergies  Allergen Reactions  . Crestor [Rosuvastatin]     Myalgia   . Lovastatin Other (See Comments)    Myalgias   . Niaspan [Niacin Er] Itching  . Penicillins Other (See Comments)    Child hood allergy  . Pitavastatin Other (See Comments)    Joints ache  . Zocor [Simvastatin] Other (See Comments)    Myalgias     No medications prior to admission.    No results found for this or any previous visit (from the past 48 hour(s)). No results found.  ROS  There were no vitals taken for this visit. Physical Exam  Constitutional: She appears well-developed.  HENT:  Head: Normocephalic.  Eyes: Pupils are equal, round, and reactive to light.    Canaliculitis os   Neck: Normal range of motion.  Cardiovascular: Normal rate.  Respiratory: Effort normal.  Neurological: She is alert.  Skin:  Skin is warm.     Assessment/Plan Chronic canaliculitis os c corneal irritation :  Plan:  NLD   Probing and irrigation c balloon canaliculoplasty os under general anesthesia.  Aura CampsMichael Faysal Fenoglio, MD 04/21/2017, 4:41 PM

## 2017-05-02 ENCOUNTER — Ambulatory Visit (HOSPITAL_BASED_OUTPATIENT_CLINIC_OR_DEPARTMENT_OTHER)
Admission: RE | Admit: 2017-05-02 | Discharge: 2017-05-02 | Disposition: A | Discharge status: Home / Self Care | Payer: 59 | Source: Ambulatory Visit | Attending: Ophthalmology | Admitting: Ophthalmology

## 2017-05-02 ENCOUNTER — Encounter (HOSPITAL_BASED_OUTPATIENT_CLINIC_OR_DEPARTMENT_OTHER)
Admission: RE | Disposition: A | Discharge status: Home / Self Care | Payer: Self-pay | Source: Ambulatory Visit | Attending: Ophthalmology

## 2017-05-02 ENCOUNTER — Ambulatory Visit (HOSPITAL_BASED_OUTPATIENT_CLINIC_OR_DEPARTMENT_OTHER): Payer: 59 | Admitting: Anesthesiology

## 2017-05-02 ENCOUNTER — Encounter (HOSPITAL_BASED_OUTPATIENT_CLINIC_OR_DEPARTMENT_OTHER): Payer: Self-pay | Admitting: Anesthesiology

## 2017-05-02 DIAGNOSIS — H04332 Acute lacrimal canaliculitis of left lacrimal passage: Secondary | ICD-10-CM | POA: Diagnosis not present

## 2017-05-02 DIAGNOSIS — H04422 Chronic lacrimal canaliculitis of left lacrimal passage: Secondary | ICD-10-CM | POA: Insufficient documentation

## 2017-05-02 DIAGNOSIS — I2510 Atherosclerotic heart disease of native coronary artery without angina pectoris: Secondary | ICD-10-CM | POA: Diagnosis not present

## 2017-05-02 DIAGNOSIS — Z955 Presence of coronary angioplasty implant and graft: Secondary | ICD-10-CM | POA: Insufficient documentation

## 2017-05-02 DIAGNOSIS — Z90710 Acquired absence of both cervix and uterus: Secondary | ICD-10-CM | POA: Diagnosis not present

## 2017-05-02 DIAGNOSIS — Z87891 Personal history of nicotine dependence: Secondary | ICD-10-CM | POA: Diagnosis not present

## 2017-05-02 DIAGNOSIS — Z8261 Family history of arthritis: Secondary | ICD-10-CM | POA: Insufficient documentation

## 2017-05-02 DIAGNOSIS — Z8249 Family history of ischemic heart disease and other diseases of the circulatory system: Secondary | ICD-10-CM | POA: Diagnosis not present

## 2017-05-02 DIAGNOSIS — H00036 Abscess of eyelid left eye, unspecified eyelid: Secondary | ICD-10-CM | POA: Diagnosis not present

## 2017-05-02 DIAGNOSIS — E782 Mixed hyperlipidemia: Secondary | ICD-10-CM | POA: Diagnosis not present

## 2017-05-02 DIAGNOSIS — Z888 Allergy status to other drugs, medicaments and biological substances status: Secondary | ICD-10-CM | POA: Insufficient documentation

## 2017-05-02 DIAGNOSIS — H04552 Acquired stenosis of left nasolacrimal duct: Secondary | ICD-10-CM | POA: Insufficient documentation

## 2017-05-02 DIAGNOSIS — Z9851 Tubal ligation status: Secondary | ICD-10-CM | POA: Insufficient documentation

## 2017-05-02 DIAGNOSIS — Z880 Allergy status to penicillin: Secondary | ICD-10-CM | POA: Diagnosis not present

## 2017-05-02 DIAGNOSIS — I252 Old myocardial infarction: Secondary | ICD-10-CM | POA: Insufficient documentation

## 2017-05-02 DIAGNOSIS — Z8379 Family history of other diseases of the digestive system: Secondary | ICD-10-CM | POA: Diagnosis not present

## 2017-05-02 DIAGNOSIS — I10 Essential (primary) hypertension: Secondary | ICD-10-CM | POA: Diagnosis not present

## 2017-05-02 DIAGNOSIS — H04429 Chronic lacrimal canaliculitis of unspecified lacrimal passage: Secondary | ICD-10-CM | POA: Diagnosis not present

## 2017-05-02 HISTORY — DX: Personal history of other diseases of the circulatory system: Z86.79

## 2017-05-02 HISTORY — DX: Gastro-esophageal reflux disease without esophagitis: K21.9

## 2017-05-02 HISTORY — PX: TEAR DUCT PROBING: SHX793

## 2017-05-02 HISTORY — DX: Major depressive disorder, single episode, unspecified: F32.9

## 2017-05-02 HISTORY — DX: Syncope and collapse: R55

## 2017-05-02 HISTORY — DX: Depression, unspecified: F32.A

## 2017-05-02 HISTORY — DX: Anxiety disorder, unspecified: F41.9

## 2017-05-02 LAB — POCT I-STAT 4, (NA,K, GLUC, HGB,HCT)
GLUCOSE: 94 mg/dL (ref 65–99)
HEMATOCRIT: 37 % (ref 36.0–46.0)
Hemoglobin: 12.6 g/dL (ref 12.0–15.0)
POTASSIUM: 3.5 mmol/L (ref 3.5–5.1)
Sodium: 145 mmol/L (ref 135–145)

## 2017-05-02 SURGERY — PROBING, LACRIMAL DUCT, WITH BALLOON DILATION
Anesthesia: General | Site: Eye | Laterality: Left

## 2017-05-02 MED ORDER — EPHEDRINE SULFATE-NACL 50-0.9 MG/10ML-% IV SOSY
PREFILLED_SYRINGE | INTRAVENOUS | Status: DC | PRN
  Administered 2017-05-02 (×4): 10 mg via INTRAVENOUS

## 2017-05-02 MED ORDER — PROPOFOL 10 MG/ML IV BOLUS
INTRAVENOUS | Status: AC
  Filled 2017-05-02: qty 20

## 2017-05-02 MED ORDER — PROPOFOL 10 MG/ML IV BOLUS
INTRAVENOUS | Status: DC | PRN
  Administered 2017-05-02: 150 mg via INTRAVENOUS
  Administered 2017-05-02: 30 mg via INTRAVENOUS

## 2017-05-02 MED ORDER — LIDOCAINE 2% (20 MG/ML) 5 ML SYRINGE
INTRAMUSCULAR | Status: AC
  Filled 2017-05-02: qty 5

## 2017-05-02 MED ORDER — ACETAMINOPHEN-CODEINE #2 300-15 MG PO TABS: 1.0000 | ORAL_TABLET | ORAL | 0 refills | Status: DC | PRN

## 2017-05-02 MED ORDER — POVIDONE-IODINE 5 % OP SOLN
OPHTHALMIC | Status: DC | PRN
  Administered 2017-05-02: 1 via OPHTHALMIC

## 2017-05-02 MED ORDER — DEXAMETHASONE SODIUM PHOSPHATE 10 MG/ML IJ SOLN
INTRAMUSCULAR | Status: AC
  Filled 2017-05-02: qty 1

## 2017-05-02 MED ORDER — DEXAMETHASONE SODIUM PHOSPHATE 4 MG/ML IJ SOLN
INTRAMUSCULAR | Status: DC | PRN
  Administered 2017-05-02: 10 mg via INTRAVENOUS

## 2017-05-02 MED ORDER — MIDAZOLAM HCL 2 MG/2ML IJ SOLN
INTRAMUSCULAR | Status: AC
  Filled 2017-05-02: qty 2

## 2017-05-02 MED ORDER — TOBRAMYCIN-DEXAMETHASONE 0.3-0.1 % OP OINT
TOPICAL_OINTMENT | OPHTHALMIC | Status: DC | PRN
  Administered 2017-05-02: 1 via OPHTHALMIC

## 2017-05-02 MED ORDER — PROMETHAZINE HCL 25 MG/ML IJ SOLN
6.2500 mg | INTRAMUSCULAR | Status: DC | PRN
  Filled 2017-05-02: qty 1

## 2017-05-02 MED ORDER — BSS IO SOLN
INTRAOCULAR | Status: DC | PRN
  Administered 2017-05-02: 30 mL via INTRAOCULAR

## 2017-05-02 MED ORDER — MOXIFLOXACIN HCL 0.5 % OP SOLN: 1.0000 [drp] | Freq: Two times a day (BID) | OPHTHALMIC | 0 refills | Status: AC

## 2017-05-02 MED ORDER — MOXIFLOXACIN HCL 0.5 % OP SOLN: 1.0000 [drp] | Freq: Three times a day (TID) | OPHTHALMIC | 0 refills | Status: AC

## 2017-05-02 MED ORDER — KETOROLAC TROMETHAMINE 30 MG/ML IJ SOLN
INTRAMUSCULAR | Status: AC
  Filled 2017-05-02: qty 1

## 2017-05-02 MED ORDER — ONDANSETRON HCL 4 MG/2ML IJ SOLN
INTRAMUSCULAR | Status: DC | PRN
  Administered 2017-05-02: 4 mg via INTRAVENOUS

## 2017-05-02 MED ORDER — FENTANYL CITRATE (PF) 100 MCG/2ML IJ SOLN
25.0000 ug | INTRAMUSCULAR | Status: DC | PRN
  Filled 2017-05-02: qty 1

## 2017-05-02 MED ORDER — ONDANSETRON HCL 4 MG/2ML IJ SOLN
INTRAMUSCULAR | Status: AC
  Filled 2017-05-02: qty 2

## 2017-05-02 MED ORDER — EPHEDRINE 5 MG/ML INJ
INTRAVENOUS | Status: AC
  Filled 2017-05-02: qty 10

## 2017-05-02 MED ORDER — OXYMETAZOLINE HCL 0.05 % NA SOLN
NASAL | Status: DC | PRN
  Administered 2017-05-02: 1

## 2017-05-02 MED ORDER — CIPROFLOXACIN-DEXAMETHASONE 0.3-0.1 % OT SUSP
OTIC | Status: DC | PRN
  Administered 2017-05-02: 3 [drp]

## 2017-05-02 MED ORDER — KETOROLAC TROMETHAMINE 30 MG/ML IJ SOLN
INTRAMUSCULAR | Status: DC | PRN
  Administered 2017-05-02: 30 mg via INTRAVENOUS

## 2017-05-02 MED ORDER — FENTANYL CITRATE (PF) 100 MCG/2ML IJ SOLN
INTRAMUSCULAR | Status: AC
  Filled 2017-05-02: qty 2

## 2017-05-02 MED ORDER — FLUORESCEIN SODIUM 1 MG OP STRP
ORAL_STRIP | OPHTHALMIC | Status: DC | PRN
  Administered 2017-05-02: 2 via OPHTHALMIC

## 2017-05-02 MED ORDER — MIDAZOLAM HCL 5 MG/5ML IJ SOLN
INTRAMUSCULAR | Status: DC | PRN
  Administered 2017-05-02: 2 mg via INTRAVENOUS

## 2017-05-02 MED ORDER — FENTANYL CITRATE (PF) 100 MCG/2ML IJ SOLN
INTRAMUSCULAR | Status: DC | PRN
  Administered 2017-05-02: 50 ug via INTRAVENOUS
  Administered 2017-05-02 (×2): 25 ug via INTRAVENOUS

## 2017-05-02 MED ORDER — LIDOCAINE 2% (20 MG/ML) 5 ML SYRINGE
INTRAMUSCULAR | Status: DC | PRN
  Administered 2017-05-02: 40 mg via INTRAVENOUS
  Administered 2017-05-02: 60 mg via INTRAVENOUS

## 2017-05-02 MED ORDER — LACTATED RINGERS IV SOLN
INTRAVENOUS | Status: DC
  Administered 2017-05-02 (×2): via INTRAVENOUS
  Filled 2017-05-02: qty 1000

## 2017-05-02 MED FILL — MOXIFLOXACIN 0.5% EYE DROPS: 0.5 | 30 days supply | Qty: 3 | Fill #0

## 2017-05-02 MED FILL — ACETAMINOPHEN/COD #3 TABLET: 300-30 | 1 days supply | Qty: 10 | Fill #0

## 2017-05-02 SURGICAL SUPPLY — 35 items
APL SRG 3 HI ABS STRL LF PLS (MISCELLANEOUS) ×3
APPLICATOR DR MATTHEWS STRL (MISCELLANEOUS) ×6 IMPLANT
BANDAGE EYE OVAL (MISCELLANEOUS) IMPLANT
CANISTER SUCT 3000ML PPV (MISCELLANEOUS) IMPLANT
CANISTER SUCTION 1200CC (MISCELLANEOUS) IMPLANT
CANNULA FLUORO TIP TAPE (MISCELLANEOUS) ×1 IMPLANT
CATH SUCT ARGYLE CHIMNEY 10FR (CATHETERS) ×2 IMPLANT
CONT SPEC 4OZ CLIKSEAL STRL BL (MISCELLANEOUS) ×4 IMPLANT
COVER BACK TABLE 60X90IN (DRAPES) ×2 IMPLANT
COVER MAYO STAND STRL (DRAPES) ×2 IMPLANT
DRAPE LG THREE QUARTER DISP (DRAPES) ×2 IMPLANT
DRAPE SURG 17X23 STRL (DRAPES) ×3 IMPLANT
GAUZE SPONGE 4X4 12PLY STRL LF (GAUZE/BANDAGES/DRESSINGS) ×2 IMPLANT
GLOVE SURG SIGNA 7.5 PF LTX (GLOVE) ×2 IMPLANT
GOWN STRL REUS W/ TWL LRG LVL3 (GOWN DISPOSABLE) ×1 IMPLANT
GOWN STRL REUS W/ TWL XL LVL3 (GOWN DISPOSABLE) ×1 IMPLANT
GOWN STRL REUS W/TWL LRG LVL3 (GOWN DISPOSABLE) ×2
GOWN STRL REUS W/TWL XL LVL3 (GOWN DISPOSABLE) ×2
KIT LACRICATH LACRIMAL DUCT CATHETER ×1 IMPLANT
KIT RM TURNOVER CYSTO AR (KITS) ×2 IMPLANT
MANIFOLD NEPTUNE II (INSTRUMENTS) IMPLANT
NDL 27GX1/2 REG BEVEL ECLIP (NEEDLE) IMPLANT
NEEDLE 27GX1/2 REG BEVEL ECLIP (NEEDLE) ×2 IMPLANT
NEEDLE HYPO 22GX1.5 SAFETY (NEEDLE) ×1 IMPLANT
NS IRRIG 500ML POUR BTL (IV SOLUTION) ×2 IMPLANT
PACK BASIN DAY SURGERY FS (CUSTOM PROCEDURE TRAY) ×2 IMPLANT
PAD PREP 24X48 CUFFED NSTRL (MISCELLANEOUS) ×2 IMPLANT
PATTIES SURGICAL .5 X3 (DISPOSABLE) ×3 IMPLANT
SPEAR EYE SURGICAL ST (MISCELLANEOUS) ×2 IMPLANT
SWABSTICK POVIDONE IODINE SNGL (MISCELLANEOUS) ×4 IMPLANT
SYR 3ML 18GX1 1/2 (SYRINGE) ×6 IMPLANT
SYR 3ML 23GX1 SAFETY (SYRINGE) ×3 IMPLANT
TUBE CONNECTING 12X1/4 (SUCTIONS) IMPLANT
TUBE FEEDING 8FR 16IN STR KANG (MISCELLANEOUS) ×1 IMPLANT
WATER STERILE IRR 500ML POUR (IV SOLUTION) IMPLANT

## 2017-05-02 NOTE — Interval H&P Note (Signed)
History and Physical Interval Note:  05/02/2017 9:37 AM  Colleen Dillon  has presented today for surgery, with the diagnosis of CANALICULITIS LEFT EYE  The various methods of treatment have been discussed with the patient and family. After consideration of risks, benefits and other options for treatment, the patient has consented to  Procedure(s): NASAL LACRIMAL DUCT EXPLORATION WITH LACRICATH BALLOON LEFT EYE (Left) as a surgical intervention .  The patient's history has been reviewed, patient examined, no change in status, stable for surgery.  I have reviewed the patient's chart and labs.  Questions were answered to the patient's satisfaction.     Aura CampsMichael Amer Alcindor

## 2017-05-02 NOTE — Transfer of Care (Signed)
  Last Vitals:  Vitals:   05/02/17 0741  BP: (!) 143/81  Pulse: 66  Resp: 18  Temp: 36.5 C  SpO2: 99%    Last Pain:  Vitals:   05/02/17 0741  TempSrc: Oral      Patients Stated Pain Goal: 7 (05/02/17 0755)  Immediate Anesthesia Transfer of Care Note  Patient: Colleen Dillon  Procedure(s) Performed: Procedure(s) (LRB): NASAL LACRIMAL DUCT EXPLORATION WITH LACRICATH BALLOON CANALICULOPLASTY LEFT EYE (Left)  Patient Location: PACU  Anesthesia Type: General  Level of Consciousness: awake, alert  and oriented  Airway & Oxygen Therapy: Patient Spontanous Breathing and Patient connected to nasal cannula oxygen  Post-op Assessment: Report given to PACU RN and Post -op Vital signs reviewed and stable  Post vital signs: Reviewed and stable  Complications: No apparent anesthesia complications

## 2017-05-02 NOTE — Discharge Instructions (Signed)
°  Post Anesthesia Home Care Instructions  Activity: Get plenty of rest for the remainder of the day. A responsible individual must stay with you for 24 hours following the procedure.  For the next 24 hours, DO NOT: -Drive a car -Advertising copywriterperate machinery -Drink alcoholic beverages -Take any medication unless instructed by your physician -Make any legal decisions or sign important papers.  Meals: Start with liquid foods such as gelatin or soup. Progress to regular foods as tolerated. Avoid greasy, spicy, heavy foods. If nausea and/or vomiting occur, drink only clear liquids until the nausea and/or vomiting subsides. Call your physician if vomiting continues.  Special Instructions/Symptoms: Your throat may feel dry or sore from the anesthesia or the breathing tube placed in your throat during surgery. If this causes discomfort, gargle with warm salt water. The discomfort should disappear within 24 hours.  If you had a scopolamine patch placed behind your ear for the management of post- operative nausea and/or vomiting:  1. The medication in the patch is effective for 72 hours, after which it should be removed.  Wrap patch in a tissue and discard in the trash. Wash hands thoroughly with soap and water. 2. You may remove the patch earlier than 72 hours if you experience unpleasant side effects which may include dry mouth, dizziness or visual disturbances. 3. Avoid touching the patch. Wash your hands with soap and water after contact with the patch.    Advance to PO as tolerated . D/C IVF when PO intake adequate Ice packs to os Q15 mins as tolerated.

## 2017-05-02 NOTE — Anesthesia Postprocedure Evaluation (Signed)
Anesthesia Post Note  Patient: Colleen Dillon  Procedure(s) Performed: NASAL LACRIMAL DUCT EXPLORATION WITH LACRICATH BALLOON CANALICULOPLASTY LEFT EYE (Left Eye)     Patient location during evaluation: PACU Anesthesia Type: General Level of consciousness: awake and alert Pain management: pain level controlled Vital Signs Assessment: post-procedure vital signs reviewed and stable Respiratory status: spontaneous breathing, nonlabored ventilation, respiratory function stable and patient connected to nasal cannula oxygen Cardiovascular status: blood pressure returned to baseline and stable Postop Assessment: no apparent nausea or vomiting Anesthetic complications: no    Last Vitals:  Vitals:   05/02/17 1145 05/02/17 1200  BP: 116/69 130/68  Pulse: (!) 57 (!) 58  Resp: 14 13  Temp:    SpO2:      Last Pain:  Vitals:   05/02/17 0741  TempSrc: Oral                 Kennieth RadFitzgerald, Ireene Ballowe E

## 2017-05-02 NOTE — Brief Op Note (Signed)
05/02/2017  10:53 AM  PATIENT:  Colleen Dillon  52 y.o. female  PRE-OPERATIVE DIAGNOSIS:  CANALICULITIS LEFT EYE  POST-OPERATIVE DIAGNOSIS:  CANALICULITIS LEFT EYE  PROCEDURE:  Procedure(s): NASAL LACRIMAL DUCT EXPLORATION WITH LACRICATH BALLOON CANALICULOPLASTY LEFT EYE (Left)  SURGEON:  Surgeon(s) and Role:    Aura Camps* Elliana Bal, MD - Primary  PHYSICIAN ASSISTANT:   ASSISTANTS: none   ANESTHESIA:   general  EBL:    none  BLOOD ADMINISTERED:none  DRAINS: none   LOCAL MEDICATIONS USED:  NONE  SPECIMEN:  No Specimen  DISPOSITION OF SPECIMEN:  N/A  COUNTS:  YES  TOURNIQUET:  * No tourniquets in log *  DICTATION: .Other Dictation: Dictation Number [id number]  PLAN OF CARE: Discharge to home after PACU  PATIENT DISPOSITION:  PACU - hemodynamically stable.   Delay start of Pharmacological VTE agent (>24hrs) due to surgical blood loss or risk of bleeding: yes

## 2017-05-02 NOTE — Anesthesia Procedure Notes (Signed)
Procedure Name: LMA Insertion Date/Time: 05/02/2017 9:56 AM Performed by: Marcene DuosFitzgerald, Robert, MD Pre-anesthesia Checklist: Patient identified, Emergency Drugs available, Suction available and Patient being monitored Patient Re-evaluated:Patient Re-evaluated prior to induction Oxygen Delivery Method: Circle system utilized Preoxygenation: Pre-oxygenation with 100% oxygen Induction Type: IV induction Ventilation: Mask ventilation without difficulty LMA: LMA inserted and LMA flexible inserted LMA Size: 4.0 Number of attempts: 1 Airway Equipment and Method: Bite block Placement Confirmation: positive ETCO2 Tube secured with: Tape Dental Injury: Teeth and Oropharynx as per pre-operative assessment

## 2017-05-02 NOTE — Anesthesia Preprocedure Evaluation (Addendum)
Anesthesia Evaluation  Patient identified by MRN, date of birth, ID band Patient awake    Reviewed: Allergy & Precautions, NPO status , Patient's Chart, lab work & pertinent test results, reviewed documented beta blocker date and time   Airway Mallampati: II  TM Distance: >3 FB Neck ROM: Full    Dental  (+) Dental Advisory Given   Pulmonary former smoker,    breath sounds clear to auscultation       Cardiovascular hypertension, Pt. on home beta blockers + CAD, + Past MI and + Cardiac Stents  + dysrhythmias Supra Ventricular Tachycardia  Rhythm:Regular Rate:Normal     Neuro/Psych Anxiety Depression negative neurological ROS     GI/Hepatic Neg liver ROS, GERD  ,  Endo/Other  negative endocrine ROS  Renal/GU negative Renal ROS     Musculoskeletal   Abdominal   Peds  Hematology negative hematology ROS (+)   Anesthesia Other Findings   Reproductive/Obstetrics                             Anesthesia Physical Anesthesia Plan  ASA: III  Anesthesia Plan: General   Post-op Pain Management:    Induction: Intravenous  PONV Risk Score and Plan: 3 and Ondansetron, Dexamethasone, Midazolam and Treatment may vary due to age or medical condition  Airway Management Planned: Oral ETT  Additional Equipment:   Intra-op Plan:   Post-operative Plan: Extubation in OR  Informed Consent: I have reviewed the patients History and Physical, chart, labs and discussed the procedure including the risks, benefits and alternatives for the proposed anesthesia with the patient or authorized representative who has indicated his/her understanding and acceptance.   Dental advisory given  Plan Discussed with: CRNA  Anesthesia Plan Comments:        Anesthesia Quick Evaluation

## 2017-05-03 NOTE — Op Note (Signed)
[facility]:  Colleen Dillon, Colleen Dillon               ACCOUNT NO.:  [id number]  MEDICAL RECORD NO.:  [id number]  LOCATION:                                 FACILITY:  PHYSICIAN:  Colleen Dillon, M.D.    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  POSTOPERATIVE DIAGNOSIS:  Status post probing and irrigation of the nasolacrimal passageway including puncta, canaliculus and passageway of the upper and lower eyelids of the left eye with balloon canaliculoplasty. PROCEDURE: Nasolacrimal duct probing and irrigation with lacricath balloon canaliculoplasty os. SURGEON:  Colleen Dillon, M.D.  ANESTHESIA:  General with laryngeal mask airway.  INDICATIONS FOR PROCEDURE:  Colleen BrilliantKayla Gotts is a 52 year old female with clinically blocked nasolacrimal passageway on the left side with recurrent chronic punctitis of the left inferior puncta and canaliculus. This procedure was indicated to restore patency of the canalicular- punctal system and also to flush the system and remove any remaining obstructions.  The risks and benefits of the procedure were explained to the patient prior to the procedure.  Informed consent was obtained.  DESCRIPTION OF TECHNIQUE:  The patient was taken into the operative room and placed in supine position.  The entire face was prepped and draped in usual sterile fashion.  The left eye was identified.  The left nostril was packed with two cottonoids soaked in Afrin prior to the onset of the procedure.  My attention was first directed to the left inferior system.  The punctum was dilated.  A 23-gauge cannula was used to intubate the punctal-canaliculus system passed through the sac and into the duct.  Fluorescein was irrigated  through the system and retrieved from underneath the inferior turbinate indicating a patency and correct placement.  The system was then irrigated with a 50% solution of Ciloxan and this was also retrieved from underneath the inferior turbinate indicating patency and correct placement.  An identical  procedure was then followed for the upper system that is 23- gauge cannula being introduced through the puncta through the canaliculus through the sac and duct ,and irrigated through the system with solutions of fluorescein followed by solution of Ciloxan, and retrieved from underneath the inferior turbinate indicating correct placement.  Next, a balloon canalicular apparatus, LacriCath, canaliculoplasty apparatus was introduced through the inferior system via the puncta, passed through the canaliculus through the sac and into the duct and then inflated at the  distal most mark at a pressure of 30 PSI for 90 seconds and then pulled back to the more proximal mark and inflated for pressure of 30 PSI for 60 seconds.  Thereby completing the canaliculoplasty, the apparatus was removed from the inferior system and replaced into the superior system and a similar canaliculoplasty was performed using the technique described.  Next, the system was then irrigated with 50% solution of Ciloxan.  First, the superior system was irrigated via puncta and canalicular intubation followed by irrigation of the fluid through the system and retrieval  from underneath the inferior turbinate, identical procedure and then followed for the inferior system.  At the conclusion of the procedure, TobraDex ointment was instilled in inferior fornices of the left eye.  There were no apparent complications.     Colleen Dillon A. Karleen Dillon, M.D.   ______________________________ Colleen Apple. Karleen Dillon, M.D.    MAS/MEDQ  D:  05/02/2017  T:  05/03/2017  Job:  [id number]

## 2017-05-04 ENCOUNTER — Encounter (HOSPITAL_BASED_OUTPATIENT_CLINIC_OR_DEPARTMENT_OTHER): Payer: Self-pay | Admitting: Ophthalmology

## 2017-05-21 MED FILL — CLOPIDOGREL 75 MG TABLET: 75 | 90 days supply | Qty: 90 | Fill #1

## 2017-05-28 ENCOUNTER — Ambulatory Visit: Payer: 59 | Admitting: Skilled Nursing Facility1

## 2017-06-18 MED FILL — BUPROPION HCL XL 300 MG TAB: 300 | 90 days supply | Qty: 90 | Fill #1

## 2017-06-18 MED FILL — NITROFURANTOIN MCR 100 MG C: 100 | 5 days supply | Qty: 10 | Fill #1

## 2017-06-20 MED FILL — NITROGLYCERIN 0.4 MG TAB SL: 0.4 | 10 days supply | Qty: 25 | Fill #2

## 2017-07-24 MED FILL — METOPROLOL TARTRATE 25 MG T: 25 | 30 days supply | Qty: 30 | Fill #3

## 2017-08-20 ENCOUNTER — Other Ambulatory Visit: Payer: Self-pay

## 2017-08-20 ENCOUNTER — Emergency Department (HOSPITAL_COMMUNITY)
Admission: EM | Admit: 2017-08-20 | Discharge: 2017-08-20 | Disposition: A | Discharge status: Home / Self Care | Payer: 59 | Attending: Emergency Medicine | Admitting: Emergency Medicine

## 2017-08-20 ENCOUNTER — Emergency Department (HOSPITAL_COMMUNITY): Payer: 59

## 2017-08-20 ENCOUNTER — Encounter (HOSPITAL_COMMUNITY): Payer: Self-pay

## 2017-08-20 DIAGNOSIS — K8020 Calculus of gallbladder without cholecystitis without obstruction: Secondary | ICD-10-CM | POA: Diagnosis not present

## 2017-08-20 DIAGNOSIS — I2510 Atherosclerotic heart disease of native coronary artery without angina pectoris: Secondary | ICD-10-CM | POA: Insufficient documentation

## 2017-08-20 DIAGNOSIS — Z79899 Other long term (current) drug therapy: Secondary | ICD-10-CM | POA: Diagnosis not present

## 2017-08-20 DIAGNOSIS — K8050 Calculus of bile duct without cholangitis or cholecystitis without obstruction: Secondary | ICD-10-CM

## 2017-08-20 DIAGNOSIS — Z7982 Long term (current) use of aspirin: Secondary | ICD-10-CM | POA: Insufficient documentation

## 2017-08-20 DIAGNOSIS — R1011 Right upper quadrant pain: Secondary | ICD-10-CM | POA: Diagnosis not present

## 2017-08-20 DIAGNOSIS — R1110 Vomiting, unspecified: Secondary | ICD-10-CM | POA: Diagnosis not present

## 2017-08-20 DIAGNOSIS — I252 Old myocardial infarction: Secondary | ICD-10-CM | POA: Insufficient documentation

## 2017-08-20 DIAGNOSIS — R109 Unspecified abdominal pain: Secondary | ICD-10-CM

## 2017-08-20 DIAGNOSIS — Z87891 Personal history of nicotine dependence: Secondary | ICD-10-CM | POA: Insufficient documentation

## 2017-08-20 DIAGNOSIS — F329 Major depressive disorder, single episode, unspecified: Secondary | ICD-10-CM | POA: Diagnosis not present

## 2017-08-20 DIAGNOSIS — M549 Dorsalgia, unspecified: Secondary | ICD-10-CM | POA: Diagnosis present

## 2017-08-20 DIAGNOSIS — F419 Anxiety disorder, unspecified: Secondary | ICD-10-CM | POA: Diagnosis not present

## 2017-08-20 DIAGNOSIS — Z7902 Long term (current) use of antithrombotics/antiplatelets: Secondary | ICD-10-CM | POA: Insufficient documentation

## 2017-08-20 LAB — COMPREHENSIVE METABOLIC PANEL
ALBUMIN: 4 g/dL (ref 3.5–5.0)
ALK PHOS: 71 U/L (ref 38–126)
ALT: 17 U/L (ref 14–54)
ANION GAP: 9 (ref 5–15)
AST: 46 U/L — ABNORMAL HIGH (ref 15–41)
BUN: 21 mg/dL — ABNORMAL HIGH (ref 6–20)
CALCIUM: 9.1 mg/dL (ref 8.9–10.3)
CO2: 25 mmol/L (ref 22–32)
Chloride: 107 mmol/L (ref 101–111)
Creatinine, Ser: 1.11 mg/dL — ABNORMAL HIGH (ref 0.44–1.00)
GFR calc Af Amer: >60 mL/min (ref >60)
GFR calc non Af Amer: 55 mL/min — ABNORMAL LOW (ref >60)
GLUCOSE: 122 mg/dL — AB (ref 65–99)
Potassium: 4.2 mmol/L (ref 3.5–5.1)
SODIUM: 141 mmol/L (ref 135–145)
Total Bilirubin: 0.9 mg/dL (ref 0.3–1.2)
Total Protein: 7.5 g/dL (ref 6.5–8.1)

## 2017-08-20 LAB — URINALYSIS, ROUTINE W REFLEX MICROSCOPIC
Bilirubin Urine: NEGATIVE
Glucose, UA: NEGATIVE mg/dL
Hgb urine dipstick: NEGATIVE
Ketones, ur: NEGATIVE mg/dL
Nitrite: NEGATIVE
PROTEIN: NEGATIVE mg/dL
Specific Gravity, Urine: 1.02 (ref 1.005–1.030)
pH: 5 (ref 5.0–8.0)

## 2017-08-20 LAB — CBC
HEMATOCRIT: 36.5 % (ref 36.0–46.0)
HEMOGLOBIN: 12.2 g/dL (ref 12.0–15.0)
MCH: 31.1 pg (ref 26.0–34.0)
MCHC: 33.4 g/dL (ref 30.0–36.0)
MCV: 93.1 fL (ref 78.0–100.0)
Platelets: 353 10*3/uL (ref 150–400)
RBC: 3.92 MIL/uL (ref 3.87–5.11)
RDW: 12.2 % (ref 11.5–15.5)
WBC: 8.3 10*3/uL (ref 4.0–10.5)

## 2017-08-20 LAB — TROPONIN I

## 2017-08-20 LAB — LIPASE, BLOOD: Lipase: 53 U/L — ABNORMAL HIGH (ref 11–51)

## 2017-08-20 MED ORDER — MORPHINE SULFATE (PF) 4 MG/ML IV SOLN
4.0000 mg | Freq: Once | INTRAVENOUS | Status: AC
  Administered 2017-08-20: 4 mg via INTRAVENOUS
  Filled 2017-08-20: qty 1

## 2017-08-20 MED ORDER — SODIUM CHLORIDE 0.9 % IV SOLN
1000.0000 mL | INTRAVENOUS | Status: DC
  Administered 2017-08-20: 1000 mL via INTRAVENOUS

## 2017-08-20 MED ORDER — SODIUM CHLORIDE 0.9 % IV BOLUS (SEPSIS)
1000.0000 mL | Freq: Once | INTRAVENOUS | Status: AC
  Administered 2017-08-20: 1000 mL via INTRAVENOUS

## 2017-08-20 MED ORDER — ONDANSETRON HCL 4 MG/2ML IJ SOLN
4.0000 mg | Freq: Once | INTRAMUSCULAR | Status: AC
  Administered 2017-08-20: 4 mg via INTRAVENOUS
  Filled 2017-08-20: qty 2

## 2017-08-20 MED ORDER — ONDANSETRON 8 MG PO TBDP: 8.0000 mg | ORAL_TABLET | Freq: Three times a day (TID) | ORAL | 0 refills | Status: DC | PRN

## 2017-08-20 MED FILL — ONDANSETRON ODT 8 MG TABLET: 8 | 4 days supply | Qty: 12 | Fill #0

## 2017-08-20 NOTE — ED Triage Notes (Signed)
Patient c/o mid back pain x 2 days and upper abdominal pain today. Patient also c/o N/V that started today.

## 2017-08-20 NOTE — ED Notes (Signed)
IV removed from right AC

## 2017-08-20 NOTE — ED Notes (Signed)
US at bedside

## 2017-08-20 NOTE — ED Provider Notes (Signed)
[facility]-EMERGENCY DEPT Provider Note   CSN: [id number] Arrival date & time: 08/20/17  4098     History   Chief Complaint Chief Complaint  Patient presents with  . Back Pain  . Abdominal Pain    HPI Colleen Dillon is a 52 y.o. female.  HPI Pt started having pain on Saturday in her back.  Yesterday she started having pain in her upper abdomen.  Today she started vomiting.  Appetite has not been good but she has not noticed any worsening when eating or drinking.  No fevers.  No cough.  No urinary sx.  No diarrhea or constipation.   Past Medical History:  Diagnosis Date  . Anxiety   . CAD (coronary artery disease)   . Cardiac murmur   . Depression   . GERD (gastroesophageal reflux disease)   . History of echocardiogram    Echo 5/18: EF 60-65, and normal wall motion, grade 1 diastolic dysfunction  . History of PSVT (paroxysmal supraventricular tachycardia) 2018   monitor   . History of smoking   . Hyperlipidemia   . Obesity   . Old MI (myocardial infarction) 2006  . Pre-syncope 2018    Patient Active Problem List   Diagnosis Date Noted  . SVT (supraventricular tachycardia) (HCC) 08/15/2016  . Near syncope 08/15/2016  . DOE (dyspnea on exertion) 06/30/2015  . Chest pain 09/27/2012  . Obesity (BMI 35.0-39.9 without comorbidity) 09/27/2012  . Coronary artery disease involving native coronary artery of native heart without angina pectoris 09/27/2012  . Mixed hyperlipidemia 09/27/2012  . Fatigue 09/27/2012    Past Surgical History:  Procedure Laterality Date  . ABDOMINAL HYSTERECTOMY  2002  . CARDIAC CATHETERIZATION  05/18/2004   90% RCA stenosis, stented w/ a [employer] Liberte 5.0x20mm bare metal stent - postdilated at 10atmx10sec, stented w/ a second [employer] Liberte 4.5x21mm bare metal stent at 15atmx30sec, resulting in TIMI III flow.  Marland Kitchen CARDIAC CATHETERIZATION  09/08/2004   No intervention - continue medical therapy  .  CARDIAC CATHETERIZATION  03/21/2005   Distal RCA 50-60% in-stent restenosis, stented w/ a DES Scimed Taxus Express II 3.5x11mm stent - postdilated at 20atmx31sec, resulting in TIMI III flow-60% stenosis reduced to 0-10%  . CARDIAC CATHETERIZATION  05/23/2005   No intervention - continue medical therapy  . CARDIAC CATHETERIZATION  11/02/2008   No intervention - continue medical therapy  . CARDIAC SURGERY  2006   stent placement  . CARDIOVASCULAR STRESS TEST  10/02/2012   No significant ST segment change suggestive of ischemia  . CESAREAN SECTION  1987  . TEAR DUCT PROBING Left 05/02/2017   Procedure: NASAL LACRIMAL DUCT EXPLORATION WITH LACRICATH BALLOON CANALICULOPLASTY LEFT EYE;  Surgeon: Aura Camps, MD;  Location: [facility];  Service: Ophthalmology;  Laterality: Left;  . TRANSTHORACIC ECHOCARDIOGRAM  07/30/2012   EF 50-55%, moderate mitral regurg  . TUBAL LIGATION       OB History   None      Home Medications    Prior to Admission medications   Medication Sig Start Date End Date Taking? Authorizing Provider  acetaminophen (TYLENOL) 325 MG tablet Take 650 mg by mouth every 6 (six) hours as needed for mild pain.   Yes [provider]  aspirin EC 81 MG tablet Take 162 mg by mouth every morning.    Yes [provider]  buPROPion (WELLBUTRIN XL) 300 MG 24 hr tablet Take 300 mg by mouth daily.   Yes [provider]  clopidogrel (PLAVIX) 75 MG tablet TAKE 1 TABLET BY MOUTH EVERY MORNING 01/05/17  Yes Hilty, Lisette Abu, MD  metoprolol tartrate (LOPRESSOR) 25 MG tablet Take 0.5 tablets (12.5 mg total) by mouth 2 (two) times daily. 08/15/16  Yes Hilty, Lisette Abu, MD  nitroGLYCERIN (NITROSTAT) 0.4 MG SL tablet Place 1 tablet (0.4 mg total) under the tongue every 5 (five) minutes as needed for chest pain. 08/15/16  Yes Hilty, Lisette Abu, MD  acetaminophen-codeine (TYLENOL #2) 300-15 MG tablet Take 1 tablet by mouth every 4 (four) hours as needed for  moderate pain. Patient not taking: Reported on 08/20/2017 05/02/17   Aura Camps, MD  ondansetron [facility] ODT) 8 MG disintegrating tablet Take 1 tablet (8 mg total) by mouth every 8 (eight) hours as needed for nausea or vomiting. 08/20/17   Linwood Dibbles, MD  pantoprazole (PROTONIX) 40 MG tablet Take 1 tablet (40 mg total) by mouth at bedtime. Patient not taking: Reported on 08/20/2017 05/12/14   [name], MD    Family History Family History  Problem Relation Age of Onset  . Ulcers Father 40       Peptic ulcers  . Cancer Father 42  . Hypertension Brother 35  . Heart disease Maternal Grandfather 55  . Hypertension Brother 34  . Aneurysm Mother 60       Dissected  . Hypertension Mother 74  . Arthritis Mother 29       Rheumatoid arthritis    Social History Social History   Tobacco Use  . Smoking status: Former Games developer  . Smokeless tobacco: Former Neurosurgeon    Quit date: 04/03/1987  Substance Use Topics  . Alcohol use: No  . Drug use: No     Allergies   Crestor [rosuvastatin]; Lovastatin; Niaspan [niacin er]; Penicillins; Pitavastatin; and Zocor [simvastatin]   Review of Systems Review of Systems  All other systems reviewed and are negative.    Physical Exam Updated Vital Signs BP (!) 100/51   Pulse (!) 49   Temp 98 F (36.7 C) (Oral)   Resp 17   Ht 1.575 m ( )   Wt 91.6 kg (202 lb)   SpO2 99%   BMI 36.95 kg/m   Physical Exam  Constitutional: She appears well-developed and well-nourished. No distress.  HENT:  Head: Normocephalic and atraumatic.  Right Ear: External ear normal.  Left Ear: External ear normal.  Eyes: Conjunctivae are normal. Right eye exhibits no discharge. Left eye exhibits no discharge. No scleral icterus.  Neck: Neck supple. No tracheal deviation present.  Cardiovascular: Normal rate, regular rhythm and intact distal pulses.  Pulmonary/Chest: Effort normal and breath sounds normal. No stridor. No respiratory distress. She has no  wheezes. She has no rales.  Abdominal: Soft. Bowel sounds are normal. She exhibits no distension. There is tenderness in the right upper quadrant and epigastric area. There is no rigidity, no rebound and no guarding.  Musculoskeletal: She exhibits no edema or tenderness.  Neurological: She is alert. She has normal strength. No cranial nerve deficit (no facial droop, extraocular movements intact, no slurred speech) or sensory deficit. She exhibits normal muscle tone. She displays no seizure activity. Coordination normal.  Skin: Skin is warm and dry. No rash noted.  Psychiatric: She has a normal mood and affect.  Nursing note and vitals reviewed.    ED Treatments / Results  Labs (all labs ordered are listed, but only abnormal results are displayed) Labs Reviewed  LIPASE, BLOOD - Abnormal; Notable for  the following components:      Result Value   Lipase 53 (*)    All other components within normal limits  COMPREHENSIVE METABOLIC PANEL - Abnormal; Notable for the following components:   Glucose, Bld 122 (*)    BUN 21 (*)    Creatinine, Ser 1.11 (*)    AST 46 (*)    GFR calc non Af Amer 55 (*)    All other components within normal limits  URINALYSIS, ROUTINE W REFLEX MICROSCOPIC - Abnormal; Notable for the following components:   APPearance HAZY (*)    Leukocytes, UA TRACE (*)    Bacteria, UA RARE (*)    All other components within normal limits  CBC  TROPONIN I    EKG EKG Interpretation  Date/Time:  Monday Aug 20 2017 10:27:03 EDT Ventricular Rate:  54 PR Interval:    QRS Duration: 101 QT Interval:  443 QTC Calculation: 420 R Axis:   26 Text Interpretation:  Sinus rhythm Low voltage, precordial leads Abnormal R-wave progression, early transition Abnormal inferior Q waves No significant change since last tracing Confirmed by Linwood Dibbles [phone]) on 08/20/2017 10:31:14 AM   Radiology Dg Abdomen Acute W/chest  Result Date: 08/20/2017 CLINICAL DATA:  Abdominal pain, vomiting,  preop. Back pain radiating to the front. EXAM: DG ABDOMEN ACUTE W/ 1V CHEST COMPARISON:  Chest radiograph 08/11/2013. FINDINGS: Frontal view of the chest shows midline trachea and normal heart size. Mild biapical pleural thickening. Lungs are clear. No pleural fluid. Two views of the abdomen show gas and stool scattered in the colon. No small bowel dilatation. No free air. No unexpected radiopaque calculi. IMPRESSION: No acute findings in the chest or abdomen. Electronically Signed   By: Leanna Battles M.D.   On: 08/20/2017 11:03   US Abdomen Limited Ruq  Result Date: 08/20/2017 CLINICAL DATA:  Abdominal pain with nausea and vomiting EXAM: ULTRASOUND ABDOMEN LIMITED RIGHT UPPER QUADRANT COMPARISON:  None. FINDINGS: Gallbladder: Within the gallbladder, there are multiple small echogenic foci which move and shadow consistent with cholelithiasis. Largest gallstone measures 2-3 mm in size. No gallbladder wall thickening or pericholecystic fluid. No sonographic Murphy sign noted by sonographer. Common bile duct: Diameter: 3 mm. No intrahepatic or extrahepatic biliary duct dilatation. Liver: No focal lesion identified. Within normal limits in parenchymal echogenicity. Portal vein is patent on color Doppler imaging with normal direction of blood flow towards the liver. IMPRESSION: Cholelithiasis. No gallbladder wall thickening or pericholecystic fluid. Study otherwise unremarkable. Electronically Signed   By: Bretta Bang III M.D.   On: 08/20/2017 12:01    Procedures Procedures (including critical care time)  Medications Ordered in ED Medications  sodium chloride 0.9 % bolus 1,000 mL (0 mLs Intravenous Stopped 08/20/17 1221)    Followed by  0.9 %  sodium chloride infusion (1,000 mLs Intravenous New Bag/Given 08/20/17 1008)  morphine 4 MG/ML injection 4 mg (4 mg Intravenous Given 08/20/17 1008)  ondansetron (ZOFRAN) injection 4 mg (4 mg Intravenous Given 08/20/17 1008)     Initial Impression /  Assessment and Plan / ED Course  I have reviewed the triage vital signs and the nursing notes.  Pertinent labs & imaging results that were available during my care of the patient were reviewed by me and considered in my medical decision making (see chart for details).  Clinical Course as of Aug 21 1234  Mon Aug 20, 2017  1235 Laboratory tests are reassuring.  Mild increase in her lipase but I doubt clinically significant.  Ultrasound does not show cholecystitis.   [JK]    Clinical Course User Index [JK] Linwood Dibbles, MD   Patient presented to the emergency room for evaluation of right upper quadrant abdominal pain.  Patient has a history of gallstones.  Patient is ED work-up is reassuring.  Patient symptoms improved with treatment.  Symptoms are consistent with biliary colic but no findings to suggest acute cholecystitis.  Patient has plans to follow-up with general surgery this week.  Patient understands return as needed for worsening symptoms  Final Clinical Impressions(s) / ED Diagnoses   Final diagnoses:  Biliary colic    ED Discharge Orders        Ordered    ondansetron (ZOFRAN ODT) 8 MG disintegrating tablet  Every 8 hours PRN     08/20/17 1234       Linwood Dibbles, MD 08/20/17 1236

## 2017-08-20 NOTE — Discharge Instructions (Addendum)
Follow-up with the surgeon as planned, return for fever or worsening symptoms

## 2017-08-23 ENCOUNTER — Ambulatory Visit: Payer: Self-pay | Admitting: General Surgery

## 2017-08-23 DIAGNOSIS — K8020 Calculus of gallbladder without cholecystitis without obstruction: Secondary | ICD-10-CM | POA: Diagnosis not present

## 2017-08-23 NOTE — H&P (View-Only) (Signed)
History of Present Illness (Kirrah Mustin MD; 08/23/2017 11:19 AM) The patient is a 52 year old female who presents for evaluation of gall stones. Chief Complaint: Gallstones  Patient is a 52-year-old female, with a history of CAD, stent placement, on Plavix, who comes in with several year history of epigastric abdominal pain. Patient states that most recently she had epigastric abdominal pain that radiated to the back. She states it secondary to pain she proceed to the ER. She felt that she was having a heart attack. Patient underwent workup and was found to have gallstones on ultrasound. The images personally. Patient also had LFTs were normal limits. There is no elevation of T bili. She states that she is unaware relate this to proceed have a particular foods.  Patient currently sees Dr. Hilty her cardiologist. She is on Plavix for stents..    Past Surgical History (Kelly Dockery, LPN; 08/23/2017 11:20 AM) Cesarean Section - 1  Hysterectomy (not due to cancer) - Complete  Oral Surgery   Diagnostic Studies History (Kelly Dockery, LPN; 08/23/2017 11:20 AM) Colonoscopy  within last year Mammogram  1-3 years ago Pap Smear  1-5 years ago  Medication History (Hadelyn Massenburg, LPN; 08/23/2017 11:10 AM) Acetaminophen-Codeine #3 (300-30MG Tablet, Oral) Active. Clopidogrel Bisulfate (75MG Tablet, Oral) Active. BuPROPion HCl ER (XL) (300MG Tablet ER 24HR, Oral) Active. Metoprolol Tartrate (25MG Tablet, Oral) Active. Ondansetron (8MG Tablet Disint, Oral) Active. Nitrofurantoin Macrocrystal (100MG Capsule, Oral) Active.  Social History (Kelly Dockery, LPN; 08/23/2017 11:20 AM) Alcohol use  Remotely quit alcohol use. No drug use  Tobacco use  Former smoker.  Family History (Kelly Dockery, LPN; 08/23/2017 11:20 AM) Arthritis  Brother, Mother. Cancer  Father. Melanoma  Father. Respiratory Condition  Father.  Pregnancy / Birth History (Kelly Dockery, LPN;  08/23/2017 11:20 AM) Age at menarche  12 years. Age of menopause  <43 Contraceptive History  Oral contraceptives. Gravida  2 Maternal age  15-20 Para  2  Other Problems (Kelly Dockery, LPN; 08/23/2017 11:20 AM) Chest pain  Heart murmur  Hypercholesterolemia  Myocardial infarction  Oophorectomy     Review of Systems (Kelly Dockery LPN; 08/23/2017 11:20 AM) General Present- Feeling well. Not Present- Fever. Respiratory Not Present- Cough and Difficulty Breathing. Cardiovascular Not Present- Chest Pain. Gastrointestinal Present- Abdominal Pain, Excessive gas, Indigestion, Nausea and Vomiting. Not Present- Bloating, Bloody Stool, Change in Bowel Habits, Chronic diarrhea, Constipation, Difficulty Swallowing, Gets full quickly at meals, Hemorrhoids and Rectal Pain. Musculoskeletal Not Present- Myalgia. Neurological Not Present- Weakness. All other systems negative  Vitals (Hadelyn Massenburg LPN; 08/23/2017 11:10 AM) 08/23/2017 11:09 AM Weight: 203.5 lb Height: 61in Body Surface Area: 1.9 m Body Mass Index: 38.45 kg/m  Temp.: 98.5F  Pulse: 83 (Regular)  Resp.: 18 (Unlabored)  BP: 122/82 (Sitting, Left Arm, Standard)       Physical Exam (Majd Tissue MD; 08/23/2017 11:19 AM) The physical exam findings are as follows: Note:Constitutional: No acute distress, conversant, appears stated age  Eyes: Anicteric sclerae, moist conjunctiva, no lid lag  Neck: No thyromegaly, trachea midline, no cervical lymphadenopathy  Lungs: Clear to auscultation biilaterally, normal respiratory effot  Cardiovascular: regular rate & rhythm, no murmurs, no peripheal edema, pedal pulses 2+  GI: Soft, no masses or hepatosplenomegaly, minimal tender to palpation in the right upper quadrant. MSK: Normal gait, no clubbing cyanosis, edema  Skin: No rashes, palpation reveals normal skin turgor  Psychiatric: Appropriate judgment and insight, oriented to person, place, and  time    Assessment & Plan (Thurza Kwiecinski MD;   08/23/2017 11:20 AM) SYMPTOMATIC CHOLELITHIASIS (K80.20) Impression: 52 year old female symptomatic cholelithiasis  1. Patient will require cardiac clearance by Dr. Rennis Golden in to be off her Plavix prior to surgery. 2. We will proceed to the operating room for a laparoscopic cholecystectomy with IOC  3. Risks and benefits were discussed with the patient to generally include, but not limited to: infection, bleeding, possible need for post op ERCP, damage to the bile ducts, bile leak, and possible need for further surgery. Alternatives were offered and described. All questions were answered and the patient voiced understanding of the procedure and wishes to proceed at this point with a laparoscopic cholecystectomy

## 2017-08-23 NOTE — H&P (Signed)
History of Present Illness Axel Filler MD; 08/23/2017 11:19 AM) The patient is a 52 year old female who presents for evaluation of gall stones. Chief Complaint: Gallstones  Patient is a 52 year old female, with a history of CAD, stent placement, on Plavix, who comes in with several year history of epigastric abdominal pain. Patient states that most recently she had epigastric abdominal pain that radiated to the back. She states it secondary to pain she proceed to the ER. She felt that she was having a heart attack. Patient underwent workup and was found to have gallstones on ultrasound. The images personally. Patient also had LFTs were normal limits. There is no elevation of T bili. She states that she is unaware relate this to proceed have a particular foods.  Patient currently sees Dr. Rennis Golden her cardiologist. She is on Plavix for stents..    Past Surgical History Michel Bickers, LPN; 1/61/0960 45:40 AM) Cesarean Section - 1  Hysterectomy (not due to cancer) - Complete  Oral Surgery   Diagnostic Studies History Michel Bickers, LPN; 9/81/1914 78:29 AM) Colonoscopy  within last year Mammogram  1-3 years ago Pap Smear  1-5 years ago  Medication History (Hadelyn Massenburg, LPN; 5/62/1308 65:78 AM) Acetaminophen-Codeine #3 (300-30MG  Tablet, Oral) Active. Clopidogrel Bisulfate (  Tablet, Oral) Active. BuPROPion HCl ER (XL) (  Tablet ER 24HR, Oral) Active. Metoprolol Tartrate (  Tablet, Oral) Active. Ondansetron (  Tablet Disint, Oral) Active. Nitrofurantoin Macrocrystal (  Capsule, Oral) Active.  Social History Michel Bickers, LPN; 4/69/6295 28:41 AM) Alcohol use  Remotely quit alcohol use. No drug use  Tobacco use  Former smoker.  Family History Michel Bickers, LPN; 06/24/4008 27:25 AM) Arthritis  Brother, Mother. Cancer  Father. Melanoma  Father. Respiratory Condition  Father.  Pregnancy / Birth History Michel Bickers, LPN;  3/66/4403 11:20 AM) Age at menarche  12 years. Age of menopause  <43 Contraceptive History  Oral contraceptives. Gravida  2 Maternal age  54-20 Para  2  Other Problems Michel Bickers, LPN; 4/74/2595 63:87 AM) Chest pain  Heart murmur  Hypercholesterolemia  Myocardial infarction  Oophorectomy     Review of Systems Tresa Endo Dockery LPN; 5/64/3329 51:88 AM) General Present- Feeling well. Not Present- Fever. Respiratory Not Present- Cough and Difficulty Breathing. Cardiovascular Not Present- Chest Pain. Gastrointestinal Present- Abdominal Pain, Excessive gas, Indigestion, Nausea and Vomiting. Not Present- Bloating, Bloody Stool, Change in Bowel Habits, Chronic diarrhea, Constipation, Difficulty Swallowing, Gets full quickly at meals, Hemorrhoids and Rectal Pain. Musculoskeletal Not Present- Myalgia. Neurological Not Present- Weakness. All other systems negative  Vitals (Hadelyn Massenburg LPN; 07/17/6061 01:60 AM) 08/23/2017 11:09 AM Weight: 203.5 lb Height: 61in Body Surface Area: 1.9 m Body Mass Index: 38.45 kg/m  Temp.: 98.74F  Pulse: 83 (Regular)  Resp.: 18 (Unlabored)  BP: 122/82 (Sitting, Left Arm, Standard)       Physical Exam Axel Filler MD; 08/23/2017 11:19 AM) The physical exam findings are as follows: Note:Constitutional: No acute distress, conversant, appears stated age  Eyes: Anicteric sclerae, moist conjunctiva, no lid lag  Neck: No thyromegaly, trachea midline, no cervical lymphadenopathy  Lungs: Clear to auscultation biilaterally, normal respiratory effot  Cardiovascular: regular rate & rhythm, no murmurs, no peripheal edema, pedal pulses 2+  GI: Soft, no masses or hepatosplenomegaly, minimal tender to palpation in the right upper quadrant. MSK: Normal gait, no clubbing cyanosis, edema  Skin: No rashes, palpation reveals normal skin turgor  Psychiatric: Appropriate judgment and insight, oriented to person, place, and  time    Assessment & Plan Axel Filler MD;  08/23/2017 11:20 AM) SYMPTOMATIC CHOLELITHIASIS (K80.20) Impression: 52 year old female symptomatic cholelithiasis  1. Patient will require cardiac clearance by Dr. Rennis Golden in to be off her Plavix prior to surgery. 2. We will proceed to the operating room for a laparoscopic cholecystectomy with IOC  3. Risks and benefits were discussed with the patient to generally include, but not limited to: infection, bleeding, possible need for post op ERCP, damage to the bile ducts, bile leak, and possible need for further surgery. Alternatives were offered and described. All questions were answered and the patient voiced understanding of the procedure and wishes to proceed at this point with a laparoscopic cholecystectomy

## 2017-08-24 ENCOUNTER — Telehealth: Payer: Self-pay | Admitting: Internal Medicine

## 2017-08-24 NOTE — Telephone Encounter (Signed)
Received incoming records from [facility] for up coming appointment on 08/28/2017 @ 11:15am with Dr Rennis Golden.  Records given to [facility] in Medical Records 08/24/2017 [facility]

## 2017-08-28 ENCOUNTER — Ambulatory Visit (INDEPENDENT_AMBULATORY_CARE_PROVIDER_SITE_OTHER): Payer: 59 | Admitting: Internal Medicine

## 2017-08-28 ENCOUNTER — Encounter: Payer: Self-pay | Admitting: Internal Medicine

## 2017-08-28 VITALS — BP 130/80 | HR 69 | Ht 61.5 in | Wt 203.0 lb

## 2017-08-28 DIAGNOSIS — I2510 Atherosclerotic heart disease of native coronary artery without angina pectoris: Secondary | ICD-10-CM

## 2017-08-28 DIAGNOSIS — Z789 Other specified health status: Secondary | ICD-10-CM | POA: Diagnosis not present

## 2017-08-28 DIAGNOSIS — I471 Supraventricular tachycardia: Secondary | ICD-10-CM

## 2017-08-28 DIAGNOSIS — E782 Mixed hyperlipidemia: Secondary | ICD-10-CM | POA: Diagnosis not present

## 2017-08-28 DIAGNOSIS — Z01810 Encounter for preprocedural cardiovascular examination: Secondary | ICD-10-CM

## 2017-08-28 NOTE — Patient Instructions (Signed)
Your physician has recommended you make the following change in your medication: DECREASE aspirin to  once daily  Your physician wants you to follow-up in: ONE YEAR with Dr. Rennis Golden. You will receive a reminder letter in the mail two months in advance. If you don't receive a letter, please call our office to schedule the follow-up appointment.

## 2017-08-28 NOTE — Progress Notes (Signed)
Date:  08/28/2017   ID:  Colleen Dillon, Colleen Dillon Feb 17, 1966, MRN [id number]  PCP:  Blair Heys, MD  Primary Cardiologist:  Rennis Golden  CC: Gallbladder pain  History of Present Illness: Colleen Dillon is a 52 y.o. female history of obesity, coronary artery disease, hyperlipidemia, hypertension, formerly followed by Dr. Alanda Amass - here to establish care with me today. Patient had acute DMI in 2006 with single vessel RCA disease with placement of overlapping bare metal stents. A recatheterization for repeat angina the same day and required subsequent DES stenting of the distal RCA and PLA branches peeling occluded at that time. Last catheterization was in 2010 showed 30-50% narrowing of the acute margin. The overlapping DES stents and IVUS interrogation showed good residual lumen. His chronic PDA occlusion with collaterals from the left. She ruled out for MI in July 2012 last nuclear stress test was 10/17/2010 showed no definite inducible or reversible ischemia the EF of 58%.  Last echo was April 2014 showed ejection fraction of 50-55% with normal wall motion. Is moderate mitral valve regurgitation. Currently works as a Engineer, civil (consulting) at [employer].   She last saw Wilburt Finlay, PA-C for symptoms of fatigue and chest pain this past summer and underwent a nuclear stress test on 10/02/2012 which was negative for ischemia. She exercised to 11.2 metabolic equivalents and had no chest pain. Overall she has done well and recently had a lipid profile which demonstrated LDL cholesterol of 122.  She was previously on Crestor 10 mg daily but was intolerant of this due to side effects including myalgias. She is also previously failed Zocor and lovastatin.  I saw Colleen Dillon back in the office today. She is doing well although she reports some morning headaches. She recently had some weight gain which she reports is due to stress eating. She's been under a lot of stress as her husband has some unusual neurologic disorder  and her daughter recently had surgery and is requiring attention and dressing changes. She denies any chest pain or worsening shortness of breath.  Colleen Dillon returns today for follow-up. She reports a recently she's had some worsening shortness of breath. She's had a walk a longer distance up a hill to get to work and notes that she feels more short of breath. She does exercise about 3 times a week, but generally does not do any significant incline on the treadmill. She has managed to lose little bit of weight recently due to dietary changes as her husband is at goal on a strict diet. She denies any chest pain. Her EKG is essentially unchanged with sinus rhythm and occasional PVCs.  03/02/2016  Colleen Dillon was seen today in follow-up. She reports no significant change in her shortness of breath. She has recently noticed an increase in her cholesterol. She has been intolerant to statins in the past. She was referred for evaluation of this. Last year her LDL was 105 with a particle number of 1121. Apparently this was higher recently from her primary care provider, however I do not have those results to review. She is requesting recommendations for treatment.  08/15/2016  Colleen Dillon returns today for follow-up. She recently saw Tereso Newcomer, PA-C for near syncope. She had 2 episodes of rapid heart rate associated with graying out of her vision in one time she fell down to her knees and nearly passed out. 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 Scott recommended echocardiogram which showed normal systolic function and no significant structural heart disease. She was also placed on a monitor which she is currently wearing. I personally reviewed up loads from that monitor indicating several episodes of paroxysmal supraventricular tachycardia. Most of these episodes lasted less than 10 beats but likely represent the episode she had that  caused her to be presyncopal. In the past she's been using low-dose metoprolol as needed but has not taken any recently.  11/20/2016  Colleen Dillon returns today for follow-up. Overall she seems to be doing well. Heart rate appears well controlled on beta blocker. She feels like her metoprolol is helped with her fast heart rates. She denies any chest pain or worsening shortness of breath. She does have a history of dyslipidemia and given her history of coronary disease should be on treatment for that. Unfortunately she's been intolerant to 4 different statins at different doses as well as niacin in the past. We discussed new medications including the PCSK9 inhibitors which she may be a candidate for. However, it is been several years since she has last had a lipid profile.  08/28/2017  Colleen Dillon was seen today in follow-up.  Recently she has been having difficulty with her gallbladder.  She is actually scheduled to have a cholecystectomy.  She is here for preoperative clearance.  Her last year she is done well without any chest pain or worsening shortness of breath.  She has been statin intolerant and we discussed starting PCSK9 inhibitors, but in the interim she enrolled in the CLEAR trial with Dr. Verl Dicker office.  We do not know whether she is on active medication or placebo.  This will continue to enroll into next year.  Denies any recurrent palpitations.  Of note she is on 162 mg dose of aspirin and Plavix.  Wt Readings from Last 3 Encounters:  08/28/17 203 lb (92.1 kg)  08/20/17 202 lb (91.6 kg)  05/02/17 202 lb 14.4 oz (92 kg)     Past Medical History:  Diagnosis Date  . Anxiety   . CAD (coronary artery disease)   . Cardiac murmur   . Depression   . GERD (gastroesophageal reflux disease)   . History of echocardiogram    Echo 5/18: EF 60-65, and normal wall motion, grade 1 diastolic dysfunction  . History of PSVT (paroxysmal supraventricular tachycardia) 2018   monitor   . History of  smoking   . Hyperlipidemia   . Obesity   . Old MI (myocardial infarction) 2006  . Pre-syncope 2018    Current Outpatient Medications  Medication Sig Dispense Refill  . acetaminophen (TYLENOL) 325 MG tablet Take 650 mg by mouth every 6 (six) hours as needed for mild pain.    Marland Kitchen acetaminophen-codeine (TYLENOL #2) 300-15 MG tablet Take 1 tablet by mouth every 4 (four) hours as needed for moderate pain. 10 tablet 0  . aspirin EC 81 MG tablet Take 162 mg by mouth every morning.     Marland Kitchen buPROPion (WELLBUTRIN XL) 300 MG 24 hr tablet Take 300 mg by mouth daily.    . clopidogrel (PLAVIX) 75 MG tablet TAKE 1 TABLET BY MOUTH EVERY MORNING 90 tablet 3  . metoprolol tartrate (LOPRESSOR) 25 MG tablet Take 0.5 tablets (12.5 mg total) by mouth 2 (two) times daily. 30 tablet 5  . nitroGLYCERIN (NITROSTAT) 0.4 MG SL tablet Place 1 tablet (0.4 mg total) under the tongue every 5 (five) minutes as needed for chest pain. 25 tablet  3  . ondansetron (ZOFRAN ODT) 8 MG disintegrating tablet Take 1 tablet (8 mg total) by mouth every 8 (eight) hours as needed for nausea or vomiting. 12 tablet 0  . pantoprazole (PROTONIX) 40 MG tablet Take 1 tablet (40 mg total) by mouth at bedtime. 90 tablet 1   No current facility-administered medications for this visit.     Allergies:    Allergies  Allergen Reactions  . Crestor [Rosuvastatin]     Myalgia   . Lovastatin Other (See Comments)    Myalgias   . Niaspan [Niacin Er] Itching  . Penicillins Other (See Comments)    Child hood allergy Has patient had a PCN reaction causing immediate rash, facial/tongue/throat swelling, SOB or lightheadedness with hypotension: No  Has patient had a PCN reaction causing severe rash involving mucus membranes or skin necrosis: No Has patient had a PCN reaction that required hospitalization: No Has patient had a PCN reaction occurring within the last 10 years: No If all of the above answers are "NO", then may proceed with Cephalosporin use.   . Pitavastatin Other (See Comments)    Joints ache  . Zocor [Simvastatin] Other (See Comments)    Myalgias        Social History:  The patient  reports that she has quit smoking. She quit smokeless tobacco use about 30 years ago. She reports that she does not drink alcohol or use drugs.    Family history: No significant cardiac history  ROS:   Pertinent items noted in HPI and remainder of comprehensive ROS otherwise negative.   PHYSICAL EXAM: VS:  BP 130/80   Pulse 69   Ht 5' 1.5" (1.562 m)   Wt 203 lb (92.1 kg)   SpO2 97%   BMI 37.74 kg/m  General appearance: alert and no distress Neck: no carotid bruit, no JVD and thyroid not enlarged, symmetric, no tenderness/mass/nodules Lungs: clear to auscultation bilaterally Heart: regular rate and rhythm, S1, S2 normal, no murmur, click, rub or gallop Abdomen: soft, non-tender; bowel sounds normal; no masses,  no organomegaly Extremities: extremities normal, atraumatic, no cyanosis or edema Pulses: 2+ and symmetric Skin: Skin color, texture, turgor normal. No rashes or lesions Neurologic: Grossly normal Psych: Pleasant  EKG:  Normal sinus rhythm at 62-personally reviewed  ASSESSMENT: 1. Acceptable risk for upcoming cholecystectomy 2. SVT - improved on BB 3. Presyncope -resolved 4. CAD - Acute MI in 2006 with single vessel RCA disease, status post overlapping BMS 5. Dyslipidemia - statin intolerant 6. Moderate obesity  PLAN: 1.  Colleen Dillon denies chest pain or worsening shortness of breath.  She has had no further SVT.  She is acceptable risk for upcoming cholecystectomy.  She should hold aspirin 7 days prior to and clopidogrel 5 days prior to surgery.  I have advised that she only start aspirin  daily when she restarts treatment as well as her clopidogrel.  With regards to statin intolerance.  She has enrolled in the CLEAR trial through Dr. Verl Dicker office.  We are blinded to the results and she may be getting placebo.   She should likely be in the study for at least another year.  Early results are very encouraging with the medication.  If she did receive medication then she should be actually treated to goal based on its percent reduction of cholesterol.  Follow-up with me annually or sooner as necessary.  [name], MD, [facility], FACP  [place]  [facility]  Medical Director of the Advanced Lipid  Disorders &  Cardiovascular Risk Reduction Clinic Diplomate of the American Board of Clinical Lipidology Attending Cardiologist  Direct Dial: [phone]  Fax: [phone]  Website:  [email or web address]

## 2017-08-30 ENCOUNTER — Other Ambulatory Visit: Payer: Self-pay

## 2017-08-30 ENCOUNTER — Encounter (HOSPITAL_COMMUNITY): Payer: Self-pay | Admitting: *Deleted

## 2017-08-30 NOTE — Progress Notes (Signed)
Pt denies any acute cardiopulmonary issues. Pt under the care of Dr. Rennis Golden; anesthesia made aware of pre-op clearance note in Epic. Pt stated that last dose of Aspirin and Plavix was Thursday, May 24. Pt made aware to stop taking vitamins, fish oil and herbal medications. Do not take any NSAIDs ie: Ibuprofen, Advil, Naproxen (Aleve), Motrin, BC and Goody Powder. Pt verbalized understanding of all pre-op instructions.

## 2017-08-30 NOTE — Progress Notes (Signed)
Anesthesia Chart Review:  Pt is a same day work up   Case:  [id number] Date/Time:  08/31/17 0715   Procedure:  LAPAROSCOPIC CHOLECYSTECTOMY WITH INTRAOPERATIVE CHOLANGIOGRAM (N/A )   Anesthesia type:  General   Pre-op diagnosis:  Gallstones   Location:  [facility] ROOM 02 / [facility]   Surgeon:  Axel Filler, MD      DISCUSSION: - Pt is a 52 year old female with hx CAD (overlapping BMS to RCA and DES to distal RCA and PLA branches 2006, HTN, SVT.   - Pt to hold ASA 7 days before surgery and plavix 5 days before surgery   PROVIDERS: Blair Heys, MD Cardiologist is Zoila Shutter, MD who cleared pt for surgery at acceptable risk at last office visit 08/28/17   LABS:  CMP and CBC from ED visit 08/20/17 reviewed and are acceptable for surgery   EKG 08/28/17: NSR.   CV:  Echo 08/08/16:  - Left ventricle: The cavity size was normal. Systolic function was normal. The estimated ejection fraction was in the range of 60% to 65%. Wall motion was normal; there were no regional wall motion abnormalities. Doppler parameters are consistent with abnormal left ventricular relaxation (grade 1 diastolic dysfunction). Doppler parameters are consistent with indeterminate ventricular filling pressure. - Aortic valve: Transvalvular velocity was within the normal range. There was no stenosis. There was no regurgitation. - Mitral valve: Transvalvular velocity was within the normal range. There was no evidence for stenosis. There was trivial regurgitation. - Right ventricle: The cavity size was normal. Wall thickness was normal. Systolic function was normal. - Tricuspid valve: There was no regurgitation. - Pulmonary arteries: Systolic pressure was within the normal range.  Cardiac event monitor 07/27/16:  - Sinus rhythm with short runs of PSVT. - Pt was started on beta blocker  Cardiopulmonary exercise test 07/14/15:  - Exercise testing with gas exchange demonstrates normal functional capacity when compared  to matched sedentary norms. There is no evidence of a significant ventilatory or circulatory limitation.   Cardiac cath 11/02/08:  - No significant in-stent restenosis, angiographically or by this methods with 50% ISR at posterolateral artery origin within the stent, 50% distal RCA, 30-50% areas in mid-to-distal RCA with good residual lumen in flow and good stent expansion and large dominant RCA. - Old occluded posterior descending artery midportion with collaterals from the left coronary artery.   Past Medical History:  Diagnosis Date  . Anxiety   . CAD (coronary artery disease)   . Cardiac murmur   . Depression   . GERD (gastroesophageal reflux disease)   . History of echocardiogram    Echo 5/18: EF 60-65, and normal wall motion, grade 1 diastolic dysfunction  . History of PSVT (paroxysmal supraventricular tachycardia) 2018   monitor   . History of smoking   . Hyperlipidemia   . Obesity   . Old MI (myocardial infarction) 2006  . Pre-syncope 2018    Past Surgical History:  Procedure Laterality Date  . ABDOMINAL HYSTERECTOMY  2002  . CARDIAC CATHETERIZATION  05/18/2004   90% RCA stenosis, stented w/ a [employer] Liberte 5.0x21mm bare metal stent - postdilated at 10atmx10sec, stented w/ a second [employer] Liberte 4.5x55mm bare metal stent at 15atmx30sec, resulting in TIMI III flow.  Marland Kitchen CARDIAC CATHETERIZATION  09/08/2004   No intervention - continue medical therapy  . CARDIAC CATHETERIZATION  03/21/2005   Distal RCA 50-60% in-stent restenosis, stented w/ a DES Scimed Taxus Express II 3.5x37mm stent - postdilated at  20atmx31sec, resulting in TIMI III flow-60% stenosis reduced to 0-10%  . CARDIAC CATHETERIZATION  05/23/2005   No intervention - continue medical therapy  . CARDIAC CATHETERIZATION  11/02/2008   No intervention - continue medical therapy  . CARDIAC SURGERY  2006   stent placement  . CARDIOVASCULAR STRESS TEST  10/02/2012   No significant ST segment change  suggestive of ischemia  . CESAREAN SECTION  1987  . TEAR DUCT PROBING Left 05/02/2017   Procedure: NASAL LACRIMAL DUCT EXPLORATION WITH LACRICATH BALLOON CANALICULOPLASTY LEFT EYE;  Surgeon: Aura Camps, MD;  Location: [facility];  Service: Ophthalmology;  Laterality: Left;  . TRANSTHORACIC ECHOCARDIOGRAM  07/30/2012   EF 50-55%, moderate mitral regurg  . TUBAL LIGATION      MEDICATIONS: No current facility-administered medications for this encounter.    Marland Kitchen acetaminophen (TYLENOL) 325 MG tablet  . buPROPion (WELLBUTRIN XL) 300 MG 24 hr tablet  . Investigational - Study Medication  . metoprolol tartrate (LOPRESSOR) 25 MG tablet  . nitroGLYCERIN (NITROSTAT) 0.4 MG SL tablet  . ondansetron (ZOFRAN ODT) 8 MG disintegrating tablet  . pantoprazole (PROTONIX) 40 MG tablet  . aspirin EC 81 MG tablet  . clopidogrel (PLAVIX) 75 MG tablet   - Pt to hold ASA 7 days before surgery and plavix 5 days before surgery   If no changes, I anticipate pt can proceed with surgery as scheduled.   Rica Mast, FNP-BC [facility]/Anesthesiology Phone: [phone] 08/30/2017 3:06 PM

## 2017-08-31 ENCOUNTER — Encounter (HOSPITAL_COMMUNITY)
Admission: RE | Disposition: A | Discharge status: Home / Self Care | Payer: Self-pay | Source: Ambulatory Visit | Attending: General Surgery

## 2017-08-31 ENCOUNTER — Ambulatory Visit (HOSPITAL_COMMUNITY)
Admission: RE | Admit: 2017-08-31 | Discharge: 2017-08-31 | Disposition: A | Discharge status: Home / Self Care | Payer: 59 | Source: Ambulatory Visit | Attending: General Surgery | Admitting: General Surgery

## 2017-08-31 ENCOUNTER — Ambulatory Visit (HOSPITAL_COMMUNITY): Payer: 59 | Admitting: Emergency Medicine

## 2017-08-31 ENCOUNTER — Encounter (HOSPITAL_COMMUNITY): Payer: Self-pay | Admitting: *Deleted

## 2017-08-31 ENCOUNTER — Ambulatory Visit (HOSPITAL_COMMUNITY): Payer: 59

## 2017-08-31 DIAGNOSIS — Z79899 Other long term (current) drug therapy: Secondary | ICD-10-CM | POA: Diagnosis not present

## 2017-08-31 DIAGNOSIS — Z7902 Long term (current) use of antithrombotics/antiplatelets: Secondary | ICD-10-CM | POA: Insufficient documentation

## 2017-08-31 DIAGNOSIS — K8020 Calculus of gallbladder without cholecystitis without obstruction: Secondary | ICD-10-CM | POA: Diagnosis not present

## 2017-08-31 DIAGNOSIS — K219 Gastro-esophageal reflux disease without esophagitis: Secondary | ICD-10-CM | POA: Diagnosis not present

## 2017-08-31 DIAGNOSIS — Z87891 Personal history of nicotine dependence: Secondary | ICD-10-CM | POA: Diagnosis not present

## 2017-08-31 DIAGNOSIS — I2510 Atherosclerotic heart disease of native coronary artery without angina pectoris: Secondary | ICD-10-CM | POA: Diagnosis not present

## 2017-08-31 DIAGNOSIS — E7800 Pure hypercholesterolemia, unspecified: Secondary | ICD-10-CM | POA: Insufficient documentation

## 2017-08-31 DIAGNOSIS — Z7982 Long term (current) use of aspirin: Secondary | ICD-10-CM | POA: Diagnosis not present

## 2017-08-31 DIAGNOSIS — K8010 Calculus of gallbladder with chronic cholecystitis without obstruction: Secondary | ICD-10-CM | POA: Insufficient documentation

## 2017-08-31 DIAGNOSIS — I252 Old myocardial infarction: Secondary | ICD-10-CM | POA: Insufficient documentation

## 2017-08-31 DIAGNOSIS — I471 Supraventricular tachycardia: Secondary | ICD-10-CM | POA: Diagnosis not present

## 2017-08-31 DIAGNOSIS — Z955 Presence of coronary angioplasty implant and graft: Secondary | ICD-10-CM | POA: Insufficient documentation

## 2017-08-31 DIAGNOSIS — Z419 Encounter for procedure for purposes other than remedying health state, unspecified: Secondary | ICD-10-CM

## 2017-08-31 DIAGNOSIS — I10 Essential (primary) hypertension: Secondary | ICD-10-CM | POA: Diagnosis not present

## 2017-08-31 DIAGNOSIS — K839 Disease of biliary tract, unspecified: Secondary | ICD-10-CM | POA: Diagnosis not present

## 2017-08-31 HISTORY — DX: Calculus of gallbladder without cholecystitis without obstruction: K80.20

## 2017-08-31 HISTORY — PX: CHOLECYSTECTOMY: SHX55

## 2017-08-31 SURGERY — LAPAROSCOPIC CHOLECYSTECTOMY WITH INTRAOPERATIVE CHOLANGIOGRAM
Anesthesia: General | Site: Abdomen

## 2017-08-31 MED ORDER — STERILE WATER FOR IRRIGATION IR SOLN
Status: DC | PRN
  Administered 2017-08-31: 1000 mL

## 2017-08-31 MED ORDER — PROMETHAZINE HCL 25 MG/ML IJ SOLN
INTRAMUSCULAR | Status: AC
  Filled 2017-08-31: qty 1

## 2017-08-31 MED ORDER — FENTANYL CITRATE (PF) 100 MCG/2ML IJ SOLN
INTRAMUSCULAR | Status: DC | PRN
  Administered 2017-08-31 (×4): 50 ug via INTRAVENOUS

## 2017-08-31 MED ORDER — DEXAMETHASONE SODIUM PHOSPHATE 10 MG/ML IJ SOLN
INTRAMUSCULAR | Status: AC
  Filled 2017-08-31: qty 1

## 2017-08-31 MED ORDER — ROCURONIUM BROMIDE 100 MG/10ML IV SOLN
INTRAVENOUS | Status: DC | PRN
  Administered 2017-08-31: 50 mg via INTRAVENOUS

## 2017-08-31 MED ORDER — CHLORHEXIDINE GLUCONATE CLOTH 2 % EX PADS: 6.0000 | MEDICATED_PAD | Freq: Once | CUTANEOUS | Status: DC

## 2017-08-31 MED ORDER — CELECOXIB 200 MG PO CAPS
200.0000 mg | ORAL_CAPSULE | ORAL | Status: AC
  Administered 2017-08-31: 200 mg via ORAL

## 2017-08-31 MED ORDER — LACTATED RINGERS IV SOLN
INTRAVENOUS | Status: DC | PRN
  Administered 2017-08-31 (×2): via INTRAVENOUS

## 2017-08-31 MED ORDER — GABAPENTIN 300 MG PO CAPS
ORAL_CAPSULE | ORAL | Status: AC
  Administered 2017-08-31: 300 mg via ORAL
  Filled 2017-08-31: qty 1

## 2017-08-31 MED ORDER — OXYCODONE HCL 5 MG PO TABS: 5.0000 mg | ORAL_TABLET | Freq: Once | ORAL | Status: DC | PRN

## 2017-08-31 MED ORDER — FENTANYL CITRATE (PF) 250 MCG/5ML IJ SOLN
INTRAMUSCULAR | Status: AC
  Filled 2017-08-31: qty 5

## 2017-08-31 MED ORDER — IOPAMIDOL (ISOVUE-300) INJECTION 61%
INTRAVENOUS | Status: AC
  Filled 2017-08-31: qty 50

## 2017-08-31 MED ORDER — MIDAZOLAM HCL 2 MG/2ML IJ SOLN
INTRAMUSCULAR | Status: AC
  Filled 2017-08-31: qty 2

## 2017-08-31 MED ORDER — TRAMADOL HCL 50 MG PO TABS: 50.0000 mg | ORAL_TABLET | Freq: Four times a day (QID) | ORAL | 0 refills | Status: AC | PRN

## 2017-08-31 MED ORDER — SODIUM CHLORIDE 0.9 % IV SOLN
INTRAVENOUS | Status: DC | PRN
  Administered 2017-08-31: 2 mL

## 2017-08-31 MED ORDER — PROPOFOL 10 MG/ML IV BOLUS
INTRAVENOUS | Status: DC | PRN
  Administered 2017-08-31: 150 mg via INTRAVENOUS

## 2017-08-31 MED ORDER — SODIUM CHLORIDE 0.9 % IR SOLN
Status: DC | PRN
  Administered 2017-08-31: 1000 mL

## 2017-08-31 MED ORDER — CELECOXIB 200 MG PO CAPS
ORAL_CAPSULE | ORAL | Status: AC
  Administered 2017-08-31: 200 mg via ORAL
  Filled 2017-08-31: qty 1

## 2017-08-31 MED ORDER — BUPIVACAINE HCL 0.25 % IJ SOLN
INTRAMUSCULAR | Status: DC | PRN
  Administered 2017-08-31: 8 mL

## 2017-08-31 MED ORDER — SUGAMMADEX SODIUM 200 MG/2ML IV SOLN
INTRAVENOUS | Status: DC | PRN
  Administered 2017-08-31: 140 mg via INTRAVENOUS
  Administered 2017-08-31: 360 mg via INTRAVENOUS

## 2017-08-31 MED ORDER — EPHEDRINE SULFATE 50 MG/ML IJ SOLN
INTRAMUSCULAR | Status: AC
  Filled 2017-08-31: qty 1

## 2017-08-31 MED ORDER — ACETAMINOPHEN 500 MG PO TABS
ORAL_TABLET | ORAL | Status: AC
  Administered 2017-08-31: 500 mg via ORAL
  Filled 2017-08-31: qty 2

## 2017-08-31 MED ORDER — CEFAZOLIN SODIUM-DEXTROSE 2-4 GM/100ML-% IV SOLN
2.0000 g | INTRAVENOUS | Status: AC
  Administered 2017-08-31: 2 g via INTRAVENOUS

## 2017-08-31 MED ORDER — ONDANSETRON HCL 4 MG/2ML IJ SOLN
INTRAMUSCULAR | Status: DC | PRN
  Administered 2017-08-31 (×2): 4 mg via INTRAVENOUS

## 2017-08-31 MED ORDER — DEXAMETHASONE SODIUM PHOSPHATE 10 MG/ML IJ SOLN
INTRAMUSCULAR | Status: DC | PRN
  Administered 2017-08-31: 10 mg via INTRAVENOUS

## 2017-08-31 MED ORDER — SODIUM CHLORIDE 0.9 % IJ SOLN
INTRAMUSCULAR | Status: AC
  Filled 2017-08-31: qty 10

## 2017-08-31 MED ORDER — MIDAZOLAM HCL 5 MG/5ML IJ SOLN
INTRAMUSCULAR | Status: DC | PRN
  Administered 2017-08-31: 2 mg via INTRAVENOUS

## 2017-08-31 MED ORDER — SUGAMMADEX SODIUM 200 MG/2ML IV SOLN
INTRAVENOUS | Status: AC
  Filled 2017-08-31: qty 2

## 2017-08-31 MED ORDER — PROPOFOL 10 MG/ML IV BOLUS
INTRAVENOUS | Status: AC
  Filled 2017-08-31: qty 20

## 2017-08-31 MED ORDER — OXYCODONE HCL 5 MG/5ML PO SOLN: 5.0000 mg | Freq: Once | ORAL | Status: DC | PRN

## 2017-08-31 MED ORDER — CEFAZOLIN SODIUM-DEXTROSE 2-4 GM/100ML-% IV SOLN
INTRAVENOUS | Status: AC
  Filled 2017-08-31: qty 100

## 2017-08-31 MED ORDER — ROCURONIUM BROMIDE 10 MG/ML (PF) SYRINGE
PREFILLED_SYRINGE | INTRAVENOUS | Status: AC
  Filled 2017-08-31: qty 5

## 2017-08-31 MED ORDER — LIDOCAINE 2% (20 MG/ML) 5 ML SYRINGE
INTRAMUSCULAR | Status: DC | PRN
  Administered 2017-08-31: 100 mg via INTRAVENOUS

## 2017-08-31 MED ORDER — BUPIVACAINE HCL (PF) 0.25 % IJ SOLN
INTRAMUSCULAR | Status: AC
  Filled 2017-08-31: qty 30

## 2017-08-31 MED ORDER — SUGAMMADEX SODIUM 500 MG/5ML IV SOLN
INTRAVENOUS | Status: AC
  Filled 2017-08-31: qty 5

## 2017-08-31 MED ORDER — ONDANSETRON HCL 4 MG/2ML IJ SOLN
INTRAMUSCULAR | Status: AC
  Filled 2017-08-31: qty 4

## 2017-08-31 MED ORDER — LIDOCAINE 2% (20 MG/ML) 5 ML SYRINGE
INTRAMUSCULAR | Status: AC
  Filled 2017-08-31: qty 5

## 2017-08-31 MED ORDER — HYDROMORPHONE HCL 2 MG/ML IJ SOLN: 0.2500 mg | INTRAMUSCULAR | Status: DC | PRN

## 2017-08-31 MED ORDER — 0.9 % SODIUM CHLORIDE (POUR BTL) OPTIME
TOPICAL | Status: DC | PRN
  Administered 2017-08-31: 1000 mL

## 2017-08-31 MED ORDER — GABAPENTIN 300 MG PO CAPS
300.0000 mg | ORAL_CAPSULE | ORAL | Status: AC
  Administered 2017-08-31: 300 mg via ORAL

## 2017-08-31 MED ORDER — MEPERIDINE HCL 50 MG/ML IJ SOLN: 6.2500 mg | INTRAMUSCULAR | Status: DC | PRN

## 2017-08-31 MED ORDER — PROMETHAZINE HCL 25 MG/ML IJ SOLN
6.2500 mg | INTRAMUSCULAR | Status: AC | PRN
  Administered 2017-08-31: 12.5 mg via INTRAVENOUS
  Administered 2017-08-31: 6.25 mg via INTRAVENOUS

## 2017-08-31 MED ORDER — ACETAMINOPHEN 500 MG PO TABS
1000.0000 mg | ORAL_TABLET | ORAL | Status: AC
  Administered 2017-08-31: 500 mg via ORAL

## 2017-08-31 MED FILL — traMADol HCL 50 MG TABS: 50 | 5 days supply | Qty: 20 | Fill #0

## 2017-08-31 SURGICAL SUPPLY — 45 items
ADH SKN CLS APL DERMABOND .7 (GAUZE/BANDAGES/DRESSINGS) ×1
APPLIER CLIP 5 13 M/L LIGAMAX5 (MISCELLANEOUS) ×2
APR CLP MED LRG 5 ANG JAW (MISCELLANEOUS) ×1
BAG SPEC RTRVL 10 TROC 200 (ENDOMECHANICALS)
CANISTER SUCT 3000ML PPV (MISCELLANEOUS) ×2 IMPLANT
CHLORAPREP W/TINT 26ML (MISCELLANEOUS) ×2 IMPLANT
CLIP APPLIE 5 13 M/L LIGAMAX5 (MISCELLANEOUS) ×1 IMPLANT
CLIP VESOLOCK MED LG 6/CT (CLIP) ×2 IMPLANT
COVER MAYO STAND STRL (DRAPES) ×2 IMPLANT
COVER SURGICAL LIGHT HANDLE (MISCELLANEOUS) ×2 IMPLANT
COVER TRANSDUCER ULTRASND (DRAPES) ×2 IMPLANT
DERMABOND ADVANCED (GAUZE/BANDAGES/DRESSINGS) ×1
DERMABOND ADVANCED .7 DNX12 (GAUZE/BANDAGES/DRESSINGS) ×1 IMPLANT
DRAPE C-ARM 42X72 X-RAY (DRAPES) ×2 IMPLANT
ELECT REM PT RETURN 9FT ADLT (ELECTROSURGICAL) ×2
ELECTRODE REM PT RTRN 9FT ADLT (ELECTROSURGICAL) ×1 IMPLANT
GLOVE BIO SURGEON STRL SZ7.5 (GLOVE) ×2 IMPLANT
GOWN STRL REUS W/ TWL LRG LVL3 (GOWN DISPOSABLE) ×2 IMPLANT
GOWN STRL REUS W/ TWL XL LVL3 (GOWN DISPOSABLE) ×1 IMPLANT
GOWN STRL REUS W/TWL LRG LVL3 (GOWN DISPOSABLE) ×4
GOWN STRL REUS W/TWL XL LVL3 (GOWN DISPOSABLE) ×2
GRASPER SUT TROCAR 14GX15 (MISCELLANEOUS) ×2 IMPLANT
IV CATH 14GX2 1/4 (CATHETERS) ×2 IMPLANT
KIT BASIN OR (CUSTOM PROCEDURE TRAY) ×2 IMPLANT
KIT TURNOVER KIT B (KITS) ×2 IMPLANT
NDL INSUFFLATION 14GA 120MM (NEEDLE) ×1 IMPLANT
NEEDLE INSUFFLATION 14GA 120MM (NEEDLE) ×2 IMPLANT
NS IRRIG 1000ML POUR BTL (IV SOLUTION) ×2 IMPLANT
PAD ARMBOARD 7.5X6 YLW CONV (MISCELLANEOUS) ×4 IMPLANT
POUCH RETRIEVAL ECOSAC 10 (ENDOMECHANICALS) IMPLANT
POUCH RETRIEVAL ECOSAC 10MM (ENDOMECHANICALS)
SCISSORS LAP 5X35 DISP (ENDOMECHANICALS) ×2 IMPLANT
SET CHOLANGIOGRAPHY FRANKLIN (SET/KITS/TRAYS/PACK) ×2 IMPLANT
SET IRRIG TUBING LAPAROSCOPIC (IRRIGATION / IRRIGATOR) ×2 IMPLANT
SLEEVE ENDOPATH XCEL 5M (ENDOMECHANICALS) ×2 IMPLANT
SPECIMEN JAR SMALL (MISCELLANEOUS) ×2 IMPLANT
STOPCOCK 4 WAY LG BORE MALE ST (IV SETS) ×2 IMPLANT
SUT MNCRL AB 4-0 PS2 18 (SUTURE) ×2 IMPLANT
TOWEL OR 17X24 6PK STRL BLUE (TOWEL DISPOSABLE) ×2 IMPLANT
TOWEL OR 17X26 10 PK STRL BLUE (TOWEL DISPOSABLE) ×2 IMPLANT
TRAY LAPAROSCOPIC MC (CUSTOM PROCEDURE TRAY) ×2 IMPLANT
TROCAR XCEL NON-BLD 11X100MML (ENDOMECHANICALS) ×2 IMPLANT
TROCAR XCEL NON-BLD 5MMX100MML (ENDOMECHANICALS) ×2 IMPLANT
TUBING INSUFFLATION (TUBING) ×2 IMPLANT
WATER STERILE IRR 1000ML POUR (IV SOLUTION) ×2 IMPLANT

## 2017-08-31 NOTE — Discharge Instructions (Signed)
[facility] ______[facility] °LAPAROSCOPIC SURGERY: POST OP INSTRUCTIONS °Always review your discharge instruction sheet given to you by the facility where your surgery was performed. °IF YOU HAVE DISABILITY OR FAMILY LEAVE FORMS, YOU MUST BRING THEM TO THE OFFICE FOR PROCESSING.   °DO NOT GIVE THEM TO YOUR DOCTOR. ° °1. A prescription for pain medication may be given to you upon discharge.  Take your pain medication as prescribed, if needed.  If narcotic pain medicine is not needed, then you may take acetaminophen (Tylenol) or ibuprofen (Advil) as needed. °2. Take your usually prescribed medications unless otherwise directed. °3. If you need a refill on your pain medication, please contact your pharmacy.  They will contact our office to request authorization. Prescriptions will not be filled after 5pm or on week-ends. °4. You should follow a light diet the first few days after arrival home, such as soup and crackers, etc.  Be sure to include lots of fluids daily. °5. Most patients will experience some swelling and bruising in the area of the incisions.  Ice packs will help.  Swelling and bruising can take several days to resolve.  °6. It is common to experience some constipation if taking pain medication after surgery.  Increasing fluid intake and taking a stool softener (such as Colace) will usually help or prevent this problem from occurring.  A mild laxative (Milk of Magnesia or Miralax) should be taken according to package instructions if there are no bowel movements after 48 hours. °7. Unless discharge instructions indicate otherwise, you may remove your bandages 24-48 hours after surgery, and you may shower at that time.  You may have steri-strips (small skin tapes) in place directly over the incision.  These strips should be left on the skin for 7-10 days.  If your surgeon used skin glue on the incision, you may shower in 24 hours.  The glue will flake off over the next 2-3 weeks.  Any sutures or  staples will be removed at the office during your follow-up visit. °8. ACTIVITIES:  You may resume regular (light) daily activities beginning the next day--such as daily self-care, walking, climbing stairs--gradually increasing activities as tolerated.  You may have sexual intercourse when it is comfortable.  Refrain from any heavy lifting or straining until approved by your doctor. °a. You may drive when you are no longer taking prescription pain medication, you can comfortably wear a seatbelt, and you can safely maneuver your car and apply brakes. °b. RETURN TO WORK:  __________________________________________________________ °9. You should see your doctor in the office for a follow-up appointment approximately 2-3 weeks after your surgery.  Make sure that you call for this appointment within a day or two after you arrive home to insure a convenient appointment time. °10. OTHER INSTRUCTIONS: __________________________________________________________________________________________________________________________ __________________________________________________________________________________________________________________________ °WHEN TO CALL YOUR DOCTOR: °1. Fever over 101.0 °2. Inability to urinate °3. Continued bleeding from incision. °4. Increased pain, redness, or drainage from the incision. °5. Increasing abdominal pain ° °The clinic staff is available to answer your questions during regular business hours.  Please don’t hesitate to call and ask to speak to one of the nurses for clinical concerns.  If you have a medical emergency, go to the nearest emergency room or call 911.  A surgeon from [facility] is always on call at the hospital. °[address], [address], [place], [place]  [address] ? [address] [address], [place], [place]   [address] °[phone] ? [phone] ? FAX [phone] °Web site:   [email or web address] °

## 2017-08-31 NOTE — Anesthesia Postprocedure Evaluation (Signed)
Anesthesia Post Note  Patient: Colleen Dillon  Procedure(s) Performed: LAPAROSCOPIC CHOLECYSTECTOMY WITH INTRAOPERATIVE CHOLANGIOGRAM (N/A Abdomen)     Patient location during evaluation: PACU Anesthesia Type: General Level of consciousness: awake and alert Pain management: pain level controlled Vital Signs Assessment: post-procedure vital signs reviewed and stable Respiratory status: spontaneous breathing, nonlabored ventilation and respiratory function stable Cardiovascular status: blood pressure returned to baseline and stable Postop Assessment: no apparent nausea or vomiting Anesthetic complications: no    Last Vitals:  Vitals:   08/31/17 1014 08/31/17 1029  BP: 121/74 136/72  Pulse: (!) 49 (!) 45  Resp: 12 13  Temp:    SpO2: 98% 99%    Last Pain:  Vitals:   08/31/17 1014  TempSrc:   PainSc: Asleep                 Lowella Curb

## 2017-08-31 NOTE — Transfer of Care (Signed)
Immediate Anesthesia Transfer of Care Note  Patient: Colleen Dillon  Procedure(s) Performed: LAPAROSCOPIC CHOLECYSTECTOMY WITH INTRAOPERATIVE CHOLANGIOGRAM (N/A Abdomen)  Patient Location: PACU  Anesthesia Type:General  Level of Consciousness: drowsy and patient cooperative  Airway & Oxygen Therapy: Patient Spontanous Breathing and Patient connected to nasal cannula oxygen  Post-op Assessment: Report given to RN and Post -op Vital signs reviewed and stable  Post vital signs: Reviewed and stable  Last Vitals:  Vitals Value Taken Time  BP 137/70 08/31/2017  8:44 AM  Temp 36.5 C 08/31/2017  8:44 AM  Pulse 48 08/31/2017  8:47 AM  Resp 12 08/31/2017  8:47 AM  SpO2 99 % 08/31/2017  8:47 AM  Vitals shown include unvalidated device data.  Last Pain:  Vitals:   08/31/17 0844  TempSrc:   PainSc: (P) Asleep         Complications: No apparent anesthesia complications

## 2017-08-31 NOTE — Anesthesia Preprocedure Evaluation (Signed)
Anesthesia Evaluation  Patient identified by MRN, date of birth, ID band Patient awake    Reviewed: Allergy & Precautions, NPO status , Patient's Chart, lab work & pertinent test results, reviewed documented beta blocker date and time   Airway Mallampati: II  TM Distance: >3 FB Neck ROM: Full    Dental  (+) Dental Advisory Given   Pulmonary former smoker,    breath sounds clear to auscultation       Cardiovascular hypertension, Pt. on home beta blockers + CAD, + Past MI and + Cardiac Stents  + dysrhythmias Supra Ventricular Tachycardia  Rhythm:Regular Rate:Normal     Neuro/Psych Anxiety Depression negative neurological ROS     GI/Hepatic Neg liver ROS, GERD  ,  Endo/Other  negative endocrine ROS  Renal/GU negative Renal ROS     Musculoskeletal   Abdominal   Peds  Hematology negative hematology ROS (+)   Anesthesia Other Findings   Reproductive/Obstetrics                             Anesthesia Physical  Anesthesia Plan  ASA: III  Anesthesia Plan: General   Post-op Pain Management:    Induction: Intravenous  PONV Risk Score and Plan: 3 and Ondansetron, Dexamethasone, Midazolam and Treatment may vary due to age or medical condition  Airway Management Planned: Oral ETT  Additional Equipment:   Intra-op Plan:   Post-operative Plan: Extubation in OR  Informed Consent: I have reviewed the patients History and Physical, chart, labs and discussed the procedure including the risks, benefits and alternatives for the proposed anesthesia with the patient or authorized representative who has indicated his/her understanding and acceptance.   Dental advisory given  Plan Discussed with: CRNA  Anesthesia Plan Comments:         Anesthesia Quick Evaluation

## 2017-08-31 NOTE — Interval H&P Note (Signed)
History and Physical Interval Note:  08/31/2017 7:11 AM  Colleen Dillon  has presented today for surgery, with the diagnosis of Gallstones  The various methods of treatment have been discussed with the patient and family. After consideration of risks, benefits and other options for treatment, the patient has consented to  Procedure(s): LAPAROSCOPIC CHOLECYSTECTOMY WITH INTRAOPERATIVE CHOLANGIOGRAM (N/A) as a surgical intervention .  The patient's history has been reviewed, patient examined, no change in status, stable for surgery.  I have reviewed the patient's chart and labs.  Questions were answered to the patient's satisfaction.     Marigene Ehlers., Jed Limerick

## 2017-08-31 NOTE — Op Note (Signed)
08/31/2017  8:23 AM  PATIENT:  Colleen Dillon  52 y.o. female  PRE-OPERATIVE DIAGNOSIS:  Gallstones  POST-OPERATIVE DIAGNOSIS:  Gallstones  PROCEDURE:  Procedure(s): LAPAROSCOPIC CHOLECYSTECTOMY WITH INTRAOPERATIVE CHOLANGIOGRAM (N/A)  SURGEON:  Surgeon(s) and Role:    * Axel Filler, MD - Primary  ANESTHESIA:   local and general  EBL:  10 mL   BLOOD ADMINISTERED:none  DRAINS: none   LOCAL MEDICATIONS USED:  BUPIVICAINE   SPECIMEN:  Source of Specimen:  gallbladder  DISPOSITION OF SPECIMEN:  PATHOLOGY  COUNTS:  YES  TOURNIQUET:  * No tourniquets in log *  DICTATION: .Dragon Dictation  The patient was taken to the operating and placed in the supine position with bilateral SCDs in place. The patient was prepped and draped in the usual sterile fashion. A time out was called and all facts were verified. A pneumoperitoneum was obtained via A Veress needle technique to a pressure of 14mm of mercury.  A 5mm trochar was then placed in the right upper quadrant under visualization, and there were no injuries to any abdominal organs. A 11 mm port was then placed in the umbilical region after infiltrating with local anesthesia under direct visualization. A second epigastric port was placed under direct visualization.  The gallbladder was identified and retracted, the peritoneum was then sharply dissected from the gallbladder and this dissection was carried down to Calot's triangle. The gallbladder was identified and stripped away circumferentially and seen going into the gallbladder 360, the critical angle was obtained. A Cook catheter was used to perform an intraoperative cholangiogram. The cholangiogram showed some extravasation of contrast from the cystic duct.  There no filling defects and the contrast emptied into the duodenum slowly.  No hepatic ducts were seen. 2 clips were placed proximally one distally and the cystic duct transected. The cystic artery was identified and 2  clips placed proximally and one distally and transected.   We then proceeded to remove the gallbladder off the hepatic fossa with Bovie cautery. A retrieval bag was then placed in the abdomen and gallbladder placed in the bag. The hepatic fossa was then reexamined and hemostasis was achieved with Bovie cautery and was excellent at the end of the case. The subhepatic fossa and perihepatic fossa was then irrigated until the effluent was clear. The 11 mm trocar fascia was reapproximated with the PMI & a #1 Vicryl. The pneumoperitoneum was evacuated and all trochars removed under direct visulalization. The skin was then closed with 4-0 Monocryl and the skin dressed with Dermabond. The patient was awaken from general anesthesia and taken to the recovery room in stable condition.    PLAN OF CARE: Discharge to home after PACU  PATIENT DISPOSITION:  PACU - hemodynamically stable.   Delay start of Pharmacological VTE agent (>24hrs) due to surgical blood loss or risk of bleeding: not applicable

## 2017-08-31 NOTE — Anesthesia Procedure Notes (Signed)
Procedure Name: Intubation Date/Time: 08/31/2017 7:35 AM Performed by: Marny Lowenstein, CRNA Pre-anesthesia Checklist: Patient identified, Emergency Drugs available, Suction available, Patient being monitored and Timeout performed Patient Re-evaluated:Patient Re-evaluated prior to induction Oxygen Delivery Method: Circle system utilized Preoxygenation: Pre-oxygenation with 100% oxygen Induction Type: IV induction Ventilation: Mask ventilation without difficulty Laryngoscope Size: Miller and 1 Grade View: Grade I Tube type: Oral Tube size: 7.0 mm Number of attempts: 1 Placement Confirmation: ETT inserted through vocal cords under direct vision,  positive ETCO2,  CO2 detector and breath sounds checked- equal and bilateral Secured at: 21 cm Tube secured with: Tape Dental Injury: Teeth and Oropharynx as per pre-operative assessment

## 2017-09-01 ENCOUNTER — Encounter (HOSPITAL_COMMUNITY): Payer: Self-pay | Admitting: General Surgery

## 2017-09-12 ENCOUNTER — Other Ambulatory Visit: Payer: Self-pay | Admitting: Internal Medicine

## 2017-09-12 MED FILL — METOPROLOL TARTRATE 25 MG T: 25 | 90 days supply | Qty: 90 | Fill #0

## 2017-09-12 MED FILL — CLOPIDOGREL 75 MG TABLET: 75 | 90 days supply | Qty: 90 | Fill #2

## 2017-10-02 MED FILL — buPROPion HCL ER (XL) 300 M: 300 | 90 days supply | Qty: 90 | Fill #2

## 2017-11-20 ENCOUNTER — Ambulatory Visit: Payer: 59 | Admitting: Internal Medicine

## 2017-12-12 MED FILL — CLOPIDOGREL 75 MG TABLET: 75 | 90 days supply | Qty: 90 | Fill #3

## 2017-12-12 MED FILL — METOPROLOL TARTRATE 25 MG T: 25 | 90 days supply | Qty: 90 | Fill #1

## 2018-01-04 MED FILL — buPROPion HCL ER (XL) 300 M: 300 | 90 days supply | Qty: 90 | Fill #3

## 2018-01-22 MED FILL — NITROFURANTOIN MCR 100 MG C: 100 | 5 days supply | Qty: 10 | Fill #0

## 2018-03-18 ENCOUNTER — Other Ambulatory Visit: Payer: Self-pay | Admitting: Internal Medicine

## 2018-03-18 MED FILL — METOPROLOL TARTRATE 25 MG T: 25 | 90 days supply | Qty: 90 | Fill #2

## 2018-03-21 MED FILL — CLOPIDOGREL 75 MG TABLET: 75 | 90 days supply | Qty: 90 | Fill #0

## 2018-03-22 ENCOUNTER — Other Ambulatory Visit: Payer: Self-pay

## 2018-03-22 MED ORDER — CLOPIDOGREL BISULFATE 75 MG PO TABS: 75.0000 mg | ORAL_TABLET | Freq: Every morning | ORAL | 3 refills | Status: DC

## 2018-04-16 MED FILL — buPROPion HCL ER (XL) 300 M: 300 | 90 days supply | Qty: 90 | Fill #0

## 2018-06-11 ENCOUNTER — Other Ambulatory Visit: Payer: Self-pay | Admitting: Internal Medicine

## 2018-06-12 MED FILL — METOPROLOL TARTRATE 25 MG T: 25 | 90 days supply | Qty: 90 | Fill #0

## 2018-06-14 ENCOUNTER — Telehealth: Payer: No Typology Code available for payment source | Admitting: Nurse Practitioner

## 2018-06-14 DIAGNOSIS — J0100 Acute maxillary sinusitis, unspecified: Secondary | ICD-10-CM | POA: Diagnosis not present

## 2018-06-14 DIAGNOSIS — B001 Herpesviral vesicular dermatitis: Secondary | ICD-10-CM

## 2018-06-14 MED ORDER — VALACYCLOVIR HCL 1 G PO TABS: 1000.0000 mg | ORAL_TABLET | Freq: Two times a day (BID) | ORAL | 0 refills | Status: DC

## 2018-06-14 MED ORDER — DOXYCYCLINE HYCLATE 100 MG PO TABS: 100.0000 mg | ORAL_TABLET | Freq: Two times a day (BID) | ORAL | 0 refills | Status: DC

## 2018-06-14 MED FILL — valACYclovir HCL 1 GM TABS: 1 | 4 days supply | Qty: 8 | Fill #0

## 2018-06-14 MED FILL — DOXYCYCLINE HYCLATE 100 MG: 100 | 10 days supply | Qty: 20 | Fill #0

## 2018-06-14 NOTE — Progress Notes (Signed)
We are sorry that you are not feeling well.  Here is how we plan to help!  Based on what you have shared with me it looks like you have sinusitis.  Sinusitis is inflammation and infection in the sinus cavities of the head.  Based on your presentation I believe you most likely have Acute Bacterial Sinusitis.  This is an infection caused by bacteria and is treated with antibiotics. I have prescribed Doxycycline 100mg  by mouth twice a day for 10 days. You may use an oral decongestant such as Mucinex D or if you have glaucoma or high blood pressure use plain Mucinex. Saline nasal spray help and can safely be used as often as needed for congestion.  If you develop worsening sinus pain, fever or notice severe headache and vision changes, or if symptoms are not better after completion of antibiotic, please schedule an appointment with a health care provider.    Sinus infections are not as easily transmitted as other respiratory infection, however we still recommend that you avoid close contact with loved ones, especially the very young and elderly.  Remember to wash your hands thoroughly throughout the day as this is the number one way to prevent the spread of infection!  Home Care:  Only take medications as instructed by your medical team.  Complete the entire course of an antibiotic.  Do not take these medications with alcohol.  A steam or ultrasonic humidifier can help congestion.  You can place a towel over your head and breathe in the steam from hot water coming from a faucet.  Avoid close contacts especially the very young and the elderly.  Cover your mouth when you cough or sneeze.  Always remember to wash your hands.  Get Help Right Away If:  You develop worsening fever or sinus pain.  You develop a severe head ache or visual changes.  Your symptoms persist after you have completed your treatment plan.  Make sure you  Understand these instructions.  Will watch your  condition.  Will get help right away if you are not doing well or get worse.  Your e-visit answers were reviewed by a board certified advanced clinical practitioner to complete your personal care plan.  Depending on the condition, your plan could have included both over the counter or prescription medications.  If there is a problem please reply  once you have received a response from your provider.  Your safety is important to [place].  If you have drug allergies check your prescription carefully.    You can use MyChart to ask questions about today's visit, request a non-urgent call back, or ask for a work or school excuse for 24 hours related to this e-Visit. If it has been greater than 24 hours you will need to follow up with your provider, or enter a new e-Visit to address those concerns.  You will get an e-mail in the next two days asking about your experience.  I hope that your e-visit has been valuable and will speed your recovery. Thank you for using e-visits.  We are sorry that you are not feeling well.  Here is how we plan to help!  Based on what you have shared with me it does look like you have a viral infection.    Most cold sores or fever blisters are small fluid filled blisters around the mouth caused by herpes simplex virus.  The most common strain of the virus causing cold sores is herpes simplex virus 1.  It can be spread by skin contact, sharing eating utensils, or even sharing towels.  Cold sores are contagious to other people until dry. (Approximately 5-7 days).  Wash your hands. You can spread the virus to your eyes through handling your contact lenses after touching the lesions.  Most people experience pain at the sight or tingling sensations in their lips that may begin before the ulcers erupt.  Herpes simplex is treatable but not curable.  It may lie dormant for a long time and then reappear due to stress or prolonged sun exposure.  Many patients have success in treating  their cold sores with an over the counter topical called Abreva.  You may apply the cream up to 5 times daily (maximum 10 days) until healing occurs.  If you would like to use an oral antiviral medication to speed the healing of your cold sore, I have sent a prescription to your local pharmacy Valacyclovir 2 gm twice daily for 1 day    HOME CARE:   Wash your hands frequently.  Do not pick at or rub the sore.  Don't open the blisters.  Avoid kissing other people during this time.  Avoid sharing drinking glasses, eating utensils, or razors.  Do not handle contact lenses unless you have thoroughly washed your hands with soap and warm water!  Avoid oral sex during this time.  Herpes from sores on your mouth can spread to your partner's genital area.  Avoid contact with anyone who has eczema or a weakened immune system.  Cold sores are often triggered by exposure to intense sunlight, use a lip balm containing a sunscreen (SPF 30 or higher).  GET HELP RIGHT AWAY IF:   Blisters look infected.  Blisters occur near or in the eye.  Symptoms last longer than 10 days.  Your symptoms become worse.  MAKE SURE YOU:   Understand these instructions.  Will watch your condition.  Will get help right away if you are not doing well or get worse.    Your e-visit answers were reviewed by a board certified advanced clinical practitioner to complete your personal care plan.  Depending upon the condition, your plan could have  Included both over the counter or prescription medications.    Please review your pharmacy choice.  Be sure that the pharmacy you have chosen is open so that you can pick up your prescription now.  If there is a problem you can message your provider in MyChart to have the prescription routed to another pharmacy.    Your safety is important to [place].  If you have drug allergies check our prescription carefully.  For the next 24 hours you can use MyChart to ask questions  about today's visit, request a non-urgent call back, or ask for a work or school excuse from your e-visit provider.  You will get an email in the next two days asking about your experience.  I hope that your e-visit has been valuable and will speed your recovery.  5 minutes spent reviewing and documenting in chart.

## 2018-06-27 MED FILL — CLOPIDOGREL 75 MG TABLET: 75 | 90 days supply | Qty: 90 | Fill #0

## 2018-07-22 MED FILL — buPROPion HCL ER (XL) 300 M: 300 | 90 days supply | Qty: 90 | Fill #1

## 2018-08-07 ENCOUNTER — Telehealth: Payer: Self-pay | Admitting: *Deleted

## 2018-08-07 NOTE — Telephone Encounter (Signed)
08/07/18 LMOM @ 3:42 pm,re: follow up appointment.

## 2018-09-18 MED FILL — NITROFURANTOIN MCR 100 MG C: 100 | 5 days supply | Qty: 10 | Fill #1

## 2018-09-18 MED FILL — CLOPIDOGREL 75 MG TABLET: 75 | 90 days supply | Qty: 90 | Fill #1

## 2018-10-03 ENCOUNTER — Encounter (HOSPITAL_COMMUNITY)
Admission: EM | Disposition: A | Discharge status: Home / Self Care | Payer: Self-pay | Source: Home / Self Care | Attending: Cardiology

## 2018-10-03 ENCOUNTER — Inpatient Hospital Stay (HOSPITAL_COMMUNITY)
Admission: EM | Admit: 2018-10-03 | Discharge: 2018-10-04 | DRG: 287 | Disposition: A | Discharge status: Home / Self Care | Payer: No Typology Code available for payment source | Attending: Cardiology | Admitting: Cardiology

## 2018-10-03 ENCOUNTER — Encounter (HOSPITAL_COMMUNITY): Payer: Self-pay | Admitting: Emergency Medicine

## 2018-10-03 ENCOUNTER — Emergency Department (HOSPITAL_COMMUNITY): Payer: No Typology Code available for payment source

## 2018-10-03 ENCOUNTER — Other Ambulatory Visit: Payer: Self-pay

## 2018-10-03 ENCOUNTER — Ambulatory Visit: Payer: 59 | Admitting: Physician Assistant

## 2018-10-03 DIAGNOSIS — Z1159 Encounter for screening for other viral diseases: Secondary | ICD-10-CM

## 2018-10-03 DIAGNOSIS — I252 Old myocardial infarction: Secondary | ICD-10-CM

## 2018-10-03 DIAGNOSIS — I10 Essential (primary) hypertension: Secondary | ICD-10-CM | POA: Diagnosis present

## 2018-10-03 DIAGNOSIS — R079 Chest pain, unspecified: Secondary | ICD-10-CM

## 2018-10-03 DIAGNOSIS — Z20828 Contact with and (suspected) exposure to other viral communicable diseases: Secondary | ICD-10-CM | POA: Diagnosis not present

## 2018-10-03 DIAGNOSIS — I25110 Atherosclerotic heart disease of native coronary artery with unstable angina pectoris: Secondary | ICD-10-CM | POA: Diagnosis not present

## 2018-10-03 DIAGNOSIS — E669 Obesity, unspecified: Secondary | ICD-10-CM | POA: Diagnosis present

## 2018-10-03 DIAGNOSIS — R0789 Other chest pain: Secondary | ICD-10-CM | POA: Diagnosis not present

## 2018-10-03 DIAGNOSIS — I200 Unstable angina: Secondary | ICD-10-CM | POA: Diagnosis not present

## 2018-10-03 DIAGNOSIS — R072 Precordial pain: Secondary | ICD-10-CM

## 2018-10-03 DIAGNOSIS — Z789 Other specified health status: Secondary | ICD-10-CM | POA: Diagnosis not present

## 2018-10-03 DIAGNOSIS — E782 Mixed hyperlipidemia: Secondary | ICD-10-CM | POA: Diagnosis not present

## 2018-10-03 DIAGNOSIS — T82855A Stenosis of coronary artery stent, initial encounter: Secondary | ICD-10-CM | POA: Diagnosis present

## 2018-10-03 DIAGNOSIS — Z8249 Family history of ischemic heart disease and other diseases of the circulatory system: Secondary | ICD-10-CM | POA: Diagnosis not present

## 2018-10-03 DIAGNOSIS — Y831 Surgical operation with implant of artificial internal device as the cause of abnormal reaction of the patient, or of later complication, without mention of misadventure at the time of the procedure: Secondary | ICD-10-CM | POA: Diagnosis present

## 2018-10-03 DIAGNOSIS — I2510 Atherosclerotic heart disease of native coronary artery without angina pectoris: Secondary | ICD-10-CM

## 2018-10-03 DIAGNOSIS — Z87891 Personal history of nicotine dependence: Secondary | ICD-10-CM

## 2018-10-03 HISTORY — PX: LEFT HEART CATH AND CORONARY ANGIOGRAPHY: CATH118249

## 2018-10-03 LAB — HEPATIC FUNCTION PANEL
ALT: 13 U/L (ref 0–44)
AST: 16 U/L (ref 15–41)
Albumin: 4.4 g/dL (ref 3.5–5.0)
Alkaline Phosphatase: 65 U/L (ref 38–126)
Bilirubin, Direct: 0.1 mg/dL (ref 0.0–0.2)
Indirect Bilirubin: 0.5 mg/dL (ref 0.3–0.9)
Total Bilirubin: 0.6 mg/dL (ref 0.3–1.2)
Total Protein: 7.7 g/dL (ref 6.5–8.1)

## 2018-10-03 LAB — TROPONIN I (HIGH SENSITIVITY)
Troponin I (High Sensitivity): 2 ng/L (ref <18)
Troponin I (High Sensitivity): 3 ng/L (ref <18)

## 2018-10-03 LAB — PROTIME-INR
INR: 1 (ref 0.8–1.2)
Prothrombin Time: 12.6 seconds (ref 11.4–15.2)

## 2018-10-03 LAB — CBC
HCT: 37 % (ref 36.0–46.0)
Hemoglobin: 12.1 g/dL (ref 12.0–15.0)
MCH: 31.2 pg (ref 26.0–34.0)
MCHC: 32.7 g/dL (ref 30.0–36.0)
MCV: 95.4 fL (ref 80.0–100.0)
Platelets: 309 10*3/uL (ref 150–400)
RBC: 3.88 MIL/uL (ref 3.87–5.11)
RDW: 12.5 % (ref 11.5–15.5)
WBC: 3.9 10*3/uL — ABNORMAL LOW (ref 4.0–10.5)
nRBC: 0 % (ref 0.0–0.2)

## 2018-10-03 LAB — BASIC METABOLIC PANEL
Anion gap: 10 (ref 5–15)
BUN: 21 mg/dL — ABNORMAL HIGH (ref 6–20)
CO2: 25 mmol/L (ref 22–32)
Calcium: 9.1 mg/dL (ref 8.9–10.3)
Chloride: 106 mmol/L (ref 98–111)
Creatinine, Ser: 1.03 mg/dL — ABNORMAL HIGH (ref 0.44–1.00)
GFR calc Af Amer: >60 mL/min (ref >60)
GFR calc non Af Amer: >60 mL/min (ref >60)
Glucose, Bld: 94 mg/dL (ref 70–99)
Potassium: 3.8 mmol/L (ref 3.5–5.1)
Sodium: 141 mmol/L (ref 135–145)

## 2018-10-03 LAB — APTT: aPTT: 30 seconds (ref 24–36)

## 2018-10-03 LAB — LIPASE, BLOOD: Lipase: 46 U/L (ref 11–51)

## 2018-10-03 LAB — SARS CORONAVIRUS 2 BY RT PCR (HOSPITAL ORDER, PERFORMED IN ~~LOC~~ HOSPITAL LAB): SARS Coronavirus 2: NEGATIVE

## 2018-10-03 SURGERY — LEFT HEART CATH AND CORONARY ANGIOGRAPHY
Anesthesia: LOCAL

## 2018-10-03 MED ORDER — FENTANYL CITRATE (PF) 100 MCG/2ML IJ SOLN
INTRAMUSCULAR | Status: DC | PRN
  Administered 2018-10-03: 25 ug via INTRAVENOUS

## 2018-10-03 MED ORDER — VERAPAMIL HCL 2.5 MG/ML IV SOLN
INTRAVENOUS | Status: AC
  Filled 2018-10-03: qty 2

## 2018-10-03 MED ORDER — METOPROLOL TARTRATE 12.5 MG HALF TABLET
12.5000 mg | ORAL_TABLET | Freq: Two times a day (BID) | ORAL | Status: DC
  Administered 2018-10-03 – 2018-10-04 (×2): 12.5 mg via ORAL
  Filled 2018-10-03 (×2): qty 1

## 2018-10-03 MED ORDER — SODIUM CHLORIDE 0.9 % IV SOLN
INTRAVENOUS | Status: AC | PRN
  Administered 2018-10-03: 50 mL/h via INTRAVENOUS

## 2018-10-03 MED ORDER — NITROGLYCERIN 0.4 MG SL SUBL: 0.4000 mg | SUBLINGUAL_TABLET | SUBLINGUAL | Status: DC | PRN

## 2018-10-03 MED ORDER — SODIUM CHLORIDE 0.9% FLUSH: 3.0000 mL | INTRAVENOUS | Status: DC | PRN

## 2018-10-03 MED ORDER — SODIUM CHLORIDE 0.9 % IV SOLN: 250.0000 mL | INTRAVENOUS | Status: DC | PRN

## 2018-10-03 MED ORDER — LIDOCAINE HCL (PF) 1 % IJ SOLN
INTRAMUSCULAR | Status: AC
  Filled 2018-10-03: qty 30

## 2018-10-03 MED ORDER — SODIUM CHLORIDE 0.9% FLUSH: 3.0000 mL | Freq: Two times a day (BID) | INTRAVENOUS | Status: DC

## 2018-10-03 MED ORDER — SODIUM CHLORIDE 0.9 % WEIGHT BASED INFUSION: 1.0000 mL/kg/h | INTRAVENOUS | Status: AC

## 2018-10-03 MED ORDER — MIDAZOLAM HCL 2 MG/2ML IJ SOLN
INTRAMUSCULAR | Status: AC
  Filled 2018-10-03: qty 2

## 2018-10-03 MED ORDER — ASPIRIN EC 81 MG PO TBEC: 81.0000 mg | DELAYED_RELEASE_TABLET | Freq: Every morning | ORAL | Status: DC

## 2018-10-03 MED ORDER — FENTANYL CITRATE (PF) 100 MCG/2ML IJ SOLN
INTRAMUSCULAR | Status: AC
  Filled 2018-10-03: qty 2

## 2018-10-03 MED ORDER — ACETAMINOPHEN 325 MG PO TABS
650.0000 mg | ORAL_TABLET | ORAL | Status: DC | PRN
  Administered 2018-10-03: 650 mg via ORAL
  Filled 2018-10-03: qty 2

## 2018-10-03 MED ORDER — MIDAZOLAM HCL 2 MG/2ML IJ SOLN
INTRAMUSCULAR | Status: DC | PRN
  Administered 2018-10-03: 1 mg via INTRAVENOUS

## 2018-10-03 MED ORDER — HEPARIN (PORCINE) 25000 UT/250ML-% IV SOLN
850.0000 [IU]/h | INTRAVENOUS | Status: DC
  Administered 2018-10-03: 850 [IU]/h via INTRAVENOUS
  Filled 2018-10-03: qty 250

## 2018-10-03 MED ORDER — CLOPIDOGREL BISULFATE 75 MG PO TABS
75.0000 mg | ORAL_TABLET | Freq: Every morning | ORAL | Status: DC
  Administered 2018-10-04: 75 mg via ORAL
  Filled 2018-10-03: qty 1

## 2018-10-03 MED ORDER — HEPARIN (PORCINE) IN NACL 1000-0.9 UT/500ML-% IV SOLN
INTRAVENOUS | Status: DC | PRN
  Administered 2018-10-03 (×2): 500 mL

## 2018-10-03 MED ORDER — LIDOCAINE HCL (PF) 1 % IJ SOLN
INTRAMUSCULAR | Status: DC | PRN
  Administered 2018-10-03: 2 mL

## 2018-10-03 MED ORDER — VERAPAMIL HCL 2.5 MG/ML IV SOLN
INTRAVENOUS | Status: DC | PRN
  Administered 2018-10-03: 10 mL via INTRA_ARTERIAL

## 2018-10-03 MED ORDER — IOHEXOL 350 MG/ML SOLN
INTRAVENOUS | Status: DC | PRN
  Administered 2018-10-03: 55 mL via INTRA_ARTERIAL

## 2018-10-03 MED ORDER — ASPIRIN EC 81 MG PO TBEC
81.0000 mg | DELAYED_RELEASE_TABLET | Freq: Every day | ORAL | Status: DC
  Administered 2018-10-04: 81 mg via ORAL
  Filled 2018-10-03: qty 1

## 2018-10-03 MED ORDER — HEPARIN BOLUS VIA INFUSION
3000.0000 [IU] | Freq: Once | INTRAVENOUS | Status: AC
  Administered 2018-10-03: 3000 [IU] via INTRAVENOUS
  Filled 2018-10-03: qty 3000

## 2018-10-03 MED ORDER — HEPARIN SODIUM (PORCINE) 1000 UNIT/ML IJ SOLN
INTRAMUSCULAR | Status: DC | PRN
  Administered 2018-10-03: 4500 [IU] via INTRAVENOUS

## 2018-10-03 MED ORDER — BUPROPION HCL ER (XL) 300 MG PO TB24
300.0000 mg | ORAL_TABLET | Freq: Every day | ORAL | Status: DC
  Administered 2018-10-04: 300 mg via ORAL
  Filled 2018-10-03: qty 1

## 2018-10-03 MED ORDER — ONDANSETRON HCL 4 MG/2ML IJ SOLN: 4.0000 mg | Freq: Four times a day (QID) | INTRAMUSCULAR | Status: DC | PRN

## 2018-10-03 MED ORDER — ASPIRIN 300 MG RE SUPP
300.0000 mg | RECTAL | Status: DC
  Filled 2018-10-03: qty 1

## 2018-10-03 MED ORDER — ASPIRIN 81 MG PO CHEW: 324.0000 mg | CHEWABLE_TABLET | ORAL | Status: DC

## 2018-10-03 MED ORDER — PANTOPRAZOLE SODIUM 40 MG PO TBEC
40.0000 mg | DELAYED_RELEASE_TABLET | Freq: Every day | ORAL | Status: DC
  Administered 2018-10-03: 40 mg via ORAL
  Filled 2018-10-03: qty 1

## 2018-10-03 MED ORDER — HEPARIN (PORCINE) IN NACL 1000-0.9 UT/500ML-% IV SOLN
INTRAVENOUS | Status: AC
  Filled 2018-10-03: qty 1000

## 2018-10-03 SURGICAL SUPPLY — 12 items
CATH 5FR JL3.5 JR4 ANG PIG MP (CATHETERS) ×1 IMPLANT
DEVICE RAD COMP TR BAND LRG (VASCULAR PRODUCTS) ×1 IMPLANT
GLIDESHEATH SLEND SS 6F .021 (SHEATH) ×1 IMPLANT
GUIDEWIRE INQWIRE 1.5J.035X260 (WIRE) IMPLANT
INQWIRE 1.5J .035X260CM (WIRE) ×2
KIT HEART LEFT (KITS) ×2 IMPLANT
PACK CARDIAC CATHETERIZATION (CUSTOM PROCEDURE TRAY) ×2 IMPLANT
SHEATH PROBE COVER 6X72 (BAG) ×1 IMPLANT
SYR MEDRAD MARK 7 150ML (SYRINGE) ×2 IMPLANT
TRANSDUCER W/STOPCOCK (MISCELLANEOUS) ×2 IMPLANT
TUBING CIL FLEX 10 FLL-RA (TUBING) ×2 IMPLANT
WIRE HI TORQ VERSACORE-J 145CM (WIRE) ×1 IMPLANT

## 2018-10-03 NOTE — Progress Notes (Signed)
ANTICOAGULATION CONSULT NOTE - Initial Consult  Pharmacy Consult for heparin drip Indication: chest pain/ACS  Allergies  Allergen Reactions  . Crestor [Rosuvastatin]     Myalgia   . Lovastatin Other (See Comments)    Myalgias   . Niaspan [Niacin Er] Itching  . Penicillins Other (See Comments)    Child hood allergy Has patient had a PCN reaction causing immediate rash, facial/tongue/throat swelling, SOB or lightheadedness with hypotension: No  Has patient had a PCN reaction causing severe rash involving mucus membranes or skin necrosis: No Has patient had a PCN reaction that required hospitalization: No Has patient had a PCN reaction occurring within the last 10 years: No If all of the above answers are "NO", then may proceed with Cephalosporin use.  . Pitavastatin Other (See Comments)    Joints ache  . Zocor [Simvastatin] Other (See Comments)    Myalgias     Patient Measurements: Weight: 200 lb (90.7 kg) Heparin Dosing Weight: 70 kg  Vital Signs: Temp: 98.2 F (36.8 C) (07/02 1117) Temp Source: Oral (07/02 1117) BP: 112/63 (07/02 1615) Pulse Rate: 53 (07/02 1615)  Labs: Recent Labs    10/03/18 1313 10/03/18 1514 10/03/18 1542  HGB 12.1  --   --   HCT 37.0  --   --   PLT 309  --   --   APTT  --   --  30  LABPROT  --   --  12.6  INR  --   --  1.0  CREATININE 1.03*  --   --   TROPONINIHS 2 3.0  --     CrCl cannot be calculated (Unknown ideal weight.).   Medical History: Past Medical History:  Diagnosis Date  . Anxiety   . CAD (coronary artery disease)   . Cardiac murmur   . Depression   . Gallstones   . GERD (gastroesophageal reflux disease)   . History of echocardiogram    Echo 5/18: EF 60-65, and normal wall motion, grade 1 diastolic dysfunction  . History of PSVT (paroxysmal supraventricular tachycardia) 2018   monitor   . History of smoking   . Hyperlipidemia   . Obesity   . Old MI (myocardial infarction) 2006  . Pre-syncope 2018     Assessment: Pharmacy consulted to dose/monitor heparin in this 53 year old female admitted with chest pain. Pt not taking anticoagulants PTA.   Baseline labs: All WNL  Hgb 12.1  Plt 309  APTT 30  INR 1  No weight currently in chart. Per discussion with RN, TBW = 91 kg. Ht = 61.5 inches charted in 2019. HDW = 70 kg  Goal of Therapy:  Heparin level 0.3-0.7 units/ml Monitor platelets by anticoagulation protocol: Yes   Plan:   Give 3000 units bolus x 1  Start heparin infusion at 850 units/hr  Check anti-Xa level in 6 hours and daily while on heparin  Continue to monitor H&H and platelets  Lenis Noon, PharmD 10/03/2018,4:33 PM

## 2018-10-03 NOTE — Consult Note (Signed)
Cardiology Consultation:   Patient ID: Colleen Dillon MRN: [id number]; DOB: 09/02/1965  Admit date: 10/03/2018 Date of Consult: 10/03/2018  Primary Care Provider: Ehinger, Robert, MD Primary Cardiologist: Dr Hilty  Patient Profile:   Colleen Dillon is a 53 y.o. female with a hx of CAD, s/p PCI RCA in 2006 who is being seen today for the evaluation of chest pain at the request of D Tegeler.  History of Present Illness:   [name] a pleasant 52 y.o. 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 Weintraub - here to establish care with me today. Patient had acute DMI in 2006 with single vessel RCA disease with placement of overlapping bare metal stents. A recatheterization for repeat angina the same day and required subsequent DES stenting of the distal RCA and PLA branches peeling occluded at that time. Last catheterization was in 2010 showed 30-50% narrowing of the acute margin. The overlapping DES stents and IVUS interrogation showed good residual lumen. His chronic PDA occlusion with collaterals from the left. She ruled out for MI in July 2012 last nuclear stress test was 10/17/2010 showed no definite inducible or reversible ischemia the EF of 58%.  Last echo was April 2014 showed ejection fraction of 50-55% with normal wall motion. Is moderate mitral valve regurgitation. Currently works as a nurse at [employer]. Last nuclear stress test on 10/02/2012 which was negative for ischemia.  She has known intolerance to statins, followed by Dr Hilty, currently enrolled in CLEAR study. She was last seen in 08/2017 when she was doing well.   She has noticed worsening fatigue and DOE in the last few weeks, today at work she was unusually tired, developed chest pressures very similar to pre -stenting chest pains, she then developed presyncopal episode associated with chest pressure while in the elevator.  She is still tired, but chest pain has resolved with NTG. Her ECG shows SB, otherwise normal ECG, unchanged from prior. ECG, troponin is negative, labs - normal crea, electrolytes, CBC.  Heart Pathway Score:     Past Medical History:  Diagnosis Date  . Anxiety   . CAD (coronary artery disease)   . Cardiac murmur   . Depression   . Gallstones   . GERD (gastroesophageal reflux disease)   . History of echocardiogram    Echo 5/18: EF 60-65, and normal wall motion, grade 1 diastolic dysfunction  . History of PSVT (paroxysmal supraventricular tachycardia) 2018   monitor   . History of smoking   . Hyperlipidemia   . Obesity   . Old MI (myocardial infarction) 2006  . Pre-syncope 2018    Past Surgical History:  Procedure Laterality Date  . ABDOMINAL HYSTERECTOMY  2002  . CARDIAC CATHETERIZATION  05/18/2004   90% RCA stenosis, stented w/ a [employer] Liberte 5.0x20mm bare metal stent - postdilated at 10atmx10sec, stented w/ a second [employer] Liberte 4.5x16mm bare metal stent at 15atmx30sec, resulting in TIMI III flow.  . CARDIAC CATHETERIZATION  09/08/2004   No intervention - continue medical therapy  . CARDIAC CATHETERIZATION  03/21/2005   Distal RCA 50-60% in-stent restenosis, stented w/ a DES Scimed Taxus Express II 3.5x32mm stent - postdilated at 20atmx31sec, resulting in TIMI III flow-60% stenosis reduced to 0-10%  . CARDIAC CATHETERIZATION  05/23/2005   No intervention - continue medical therapy  . CARDIAC CATHETERIZATION  11/02/2008   No intervention - continue medical therapy  . CARDIAC   SURGERY  2006   stent placement  . CARDIOVASCULAR STRESS TEST  10/02/2012   No significant ST segment change suggestive of ischemia  . CESAREAN SECTION  1987  . CHOLECYSTECTOMY N/A 08/31/2017   Procedure: LAPAROSCOPIC CHOLECYSTECTOMY WITH INTRAOPERATIVE CHOLANGIOGRAM;  Surgeon: Ralene Ok, MD;  Location: [place];  Service: General;  Laterality: N/A;  . COLONOSCOPY    . TEAR DUCT  PROBING Left 05/02/2017   Procedure: NASAL LACRIMAL DUCT EXPLORATION WITH LACRICATH BALLOON CANALICULOPLASTY LEFT EYE;  Surgeon: Gevena Cotton, MD;  Location: [facility];  Service: Ophthalmology;  Laterality: Left;  . TRANSTHORACIC ECHOCARDIOGRAM  07/30/2012   EF 50-55%, moderate mitral regurg  . TUBAL LIGATION       Home Medications:  Prior to Admission medications   Medication Sig Start Date End Date Taking? Authorizing Provider  acetaminophen (TYLENOL) 325 MG tablet Take 650 mg by mouth every 6 (six) hours as needed for mild pain.   Yes [provider]  aspirin EC 81 MG tablet Take 81 mg by mouth every morning.    Yes [provider]  buPROPion (WELLBUTRIN XL) 300 MG 24 hr tablet Take 300 mg by mouth daily.   Yes [provider]  clopidogrel (PLAVIX) 75 MG tablet Take 1 tablet (75 mg total) by mouth every morning. 03/22/18  Yes Hilty, Nadean Corwin, MD  Investigational - Study Medication Study name: Clear Additional study details: NO LIPID TESTING   Yes [provider]  metoprolol tartrate (LOPRESSOR) 25 MG tablet TAKE 1/2 TABLET BY MOUTH TWICE A DAY Patient taking differently: Take 25 mg by mouth daily.  06/12/18  Yes Hilty, Nadean Corwin, MD  nitroGLYCERIN (NITROSTAT) 0.4 MG SL tablet Place 1 tablet (0.4 mg total) under the tongue every 5 (five) minutes as needed for chest pain. 08/15/16  Yes Hilty, Nadean Corwin, MD  doxycycline (VIBRA-TABS) 100 MG tablet Take 1 tablet (100 mg total) by mouth 2 (two) times daily. 1 po bid Patient not taking: Reported on 10/03/2018 06/14/18   Chevis Pretty, FNP  ondansetron (ZOFRAN ODT) 8 MG disintegrating tablet Take 1 tablet (8 mg total) by mouth every 8 (eight) hours as needed for nausea or vomiting. Patient not taking: Reported on 10/03/2018 08/20/17   Dorie Rank, MD  pantoprazole (PROTONIX) 40 MG tablet Take 1 tablet (40 mg total) by mouth at bedtime. Patient not taking: Reported on 10/03/2018 05/12/14   Pixie Casino, MD  valACYclovir (VALTREX) 1000 MG tablet Take 1 tablet (1,000 mg total) by mouth 2 (two) times daily. For 1 day Patient not taking: Reported on 10/03/2018 06/14/18   Chevis Pretty, FNP    Inpatient Medications: Scheduled Meds:  Continuous Infusions:  PRN Meds:   Allergies:    Allergies  Allergen Reactions  . Crestor [Rosuvastatin]     Myalgia   . Lovastatin Other (See Comments)    Myalgias   . Niaspan [Niacin Er] Itching  . Penicillins Other (See Comments)    Child hood allergy Has patient had a PCN reaction causing immediate rash, facial/tongue/throat swelling, SOB or lightheadedness with hypotension: No  Has patient had a PCN reaction causing severe rash involving mucus membranes or skin necrosis: No Has patient had a PCN reaction that required hospitalization: No Has patient had a PCN reaction occurring within the last 10 years: No If all of the above answers are "NO", then may proceed with Cephalosporin use.  . Pitavastatin Other (See Comments)    Joints ache  . Zocor [Simvastatin] Other (See  Comments)    Myalgias     Social History:   Social History   Socioeconomic History  . Marital status: Married    Spouse name: Not on file  . Number of children: Not on file  . Years of education: Not on file  . Highest education level: Not on file  Occupational History  . Not on file  Social Needs  . Financial resource strain: Not on file  . Food insecurity    Worry: Not on file    Inability: Not on file  . Transportation needs    Medical: Not on file    Non-medical: Not on file  Tobacco Use  . Smoking status: Former Games developermoker  . Smokeless tobacco: Never Used  . Tobacco comment: quit smoking cigarettes > 35 years ago  Substance and Sexual Activity  . Alcohol use: No  . Drug use: No  . Sexual activity: Not on file  Lifestyle  . Physical activity    Days per week: Not on file    Minutes per session: Not on file  . Stress: Not on file   Relationships  . Social Musicianconnections    Talks on phone: Not on file    Gets together: Not on file    Attends religious service: Not on file    Active member of club or organization: Not on file    Attends meetings of clubs or organizations: Not on file    Relationship status: Not on file  . Intimate partner violence    Fear of current or ex partner: Not on file    Emotionally abused: Not on file    Physically abused: Not on file    Forced sexual activity: Not on file  Other Topics Concern  . Not on file  Social History Narrative  . Not on file    Family History:    Family History  Problem Relation Age of Onset  . Ulcers Father 40       Peptic ulcers  . Cancer Father [id number]  . Hypertension Brother 35  . Heart disease Maternal Grandfather 55  . Hypertension Brother 34  . Aneurysm Mother 60       Dissected  . Hypertension Mother [id number]  . Arthritis Mother [id number]       Rheumatoid arthritis    ROS:  Please see the history of present illness.  All other ROS reviewed and negative.     Physical Exam/Data:   Vitals:   10/03/18 1338 10/03/18 1345 10/03/18 1400 10/03/18 1415  BP: 119/72 123/83 128/75 (!) 122/56  Pulse: (!) 52 (!) 52 (!) 51 (!) 53  Resp: 15 16 20 20   Temp:      TempSrc:      SpO2: 100% 100% 100% 100%   No intake or output data in the 24 hours ending 10/03/18 1437 Last 3 Weights 08/31/2017 08/28/2017 08/20/2017  Weight (lbs) 201 lb 203 lb 202 lb  Weight (kg) 91.173 kg 92.08 kg 91.627 kg     There is no height or weight on file to calculate BMI.  General:  Well nourished, well developed, in no acute distress HEENT: normal Lymph: no adenopathy Neck: no JVD Endocrine:  No thryomegaly Vascular: No carotid bruits; FA pulses 2+ bilaterally without bruits  Cardiac:  normal S1, S2; RRR; no murmur  Lungs:  clear to auscultation bilaterally, no wheezing, rhonchi or rales  Abd: soft, nontender, no hepatomegaly  Ext: no edema Musculoskeletal:  No deformities, BUE and BLE  strength  normal and equal Skin: warm and dry  Neuro:  CNs 2-12 intact, no focal abnormalities noted Psych:  Normal affect   EKG:  The EKG was personally reviewed and demonstrates:  SB, normal ECG unchanged from prior Telemetry:  Telemetry was personally reviewed and demonstrates:  SB  Relevant CV Studies:  TTE - Left ventricle: The cavity size was normal. Systolic function was   normal. The estimated ejection fraction was in the range of 60%   to 65%. Wall motion was normal; there were no regional wall   motion abnormalities. Doppler parameters are consistent with   abnormal left ventricular relaxation (grade 1 diastolic   dysfunction). Doppler parameters are consistent with   indeterminate ventricular filling pressure. - Aortic valve: Transvalvular velocity was within the normal range.   There was no stenosis. There was no regurgitation. - Mitral valve: Transvalvular velocity was within the normal range.   There was no evidence for stenosis. There was trivial   regurgitation. - Right ventricle: The cavity size was normal. Wall thickness was   normal. Systolic function was normal. - Tricuspid valve: There was no regurgitation. - Pulmonary arteries: Systolic pressure was within the normal   range.  Laboratory Data:  High Sensitivity Troponin:   Recent Labs  Lab 10/03/18 1313  TROPONINIHS 2     Cardiac EnzymesNo results for input(s): TROPONINI in the last 168 hours. No results for input(s): TROPIPOC in the last 168 hours.  Chemistry Recent Labs  Lab 10/03/18 1313  NA 141  K 3.8  CL 106  CO2 25  GLUCOSE 94  BUN 21*  CREATININE 1.03*  CALCIUM 9.1  GFRNONAA >60  GFRAA >60  ANIONGAP 10    Recent Labs  Lab 10/03/18 1257  PROT 7.7  ALBUMIN 4.4  AST 16  ALT 13  ALKPHOS 65  BILITOT 0.6   Hematology Recent Labs  Lab 10/03/18 1313  WBC 3.9*  RBC 3.88  HGB 12.1  HCT 37.0  MCV 95.4  MCH 31.2  MCHC 32.7  RDW 12.5  PLT 309   BNPNo results for input(s):  BNP, PROBNP in the last 168 hours.  DDimer No results for input(s): DDIMER in the last 168 hours.   Radiology/Studies:  Dg Chest 2 View  Result Date: 10/03/2018 CLINICAL DATA:  Chest pressure today. EXAM: CHEST - 2 VIEW COMPARISON:  Aug 20, 2017 FINDINGS: The heart size and mediastinal contours are within normal limits. Both lungs are clear. The visualized skeletal structures are unremarkable. IMPRESSION: No active cardiopulmonary disease. Electronically Signed   By: Sherian ReinWei-Chen  Lin M.D.   On: 10/03/2018 12:21    Assessment and Plan:   1. Unstable angina, known CAD with prior overlapping stents to RCA, prior 30-50% narrowing of the acute marginal. Chronic PDA occlusion with collaterals from the left.  - start heparin drip - continue ASA, plavix - [facility] if her Covid test comes back negative - no statins given enrollment in CLEAR study - continue low dose metoprolol  2. Hypertension  - controlled  3. Hyperlipidemia - we will obtain fasting lipid panel, if lipids elevated and she has a new lesion on a cath we will discuss appropriate treatment with Dr Rennis GoldenHilty  For questions or updates, please contact [facility] Please consult [email or web address] for contact info under   Signed, Tobias AlexanderKatarina Trystan Eads, MD  10/03/2018 2:37 PM

## 2018-10-03 NOTE — ED Triage Notes (Signed)
Pt c/o chest pressure and felt "not like myself and was going to pass out". Hx MIs

## 2018-10-03 NOTE — ED Notes (Signed)
Bed: WA09 Expected date:  Expected time:  Means of arrival:  Comments: Hold for triage 2 

## 2018-10-03 NOTE — Progress Notes (Signed)
Arrived to bedside for PIV placement per consult. PIV and labs already drawn per primary care RN.

## 2018-10-03 NOTE — ED Notes (Signed)
Second tropin drawn and sent to lab, lab aware.

## 2018-10-03 NOTE — ED Notes (Signed)
This nurse gave report to [place] from [place], awaiting EMS arrival.

## 2018-10-03 NOTE — Interval H&P Note (Signed)
History and Physical Interval Note:  10/03/2018 5:29 PM  Colleen Dillon Colleen Dillon  has presented today for surgery, with the diagnosis of unstable angina.  The various methods of treatment have been discussed with the patient and family. After consideration of risks, benefits and other options for treatment, the patient has consented to  Procedure(s): LEFT HEART CATH AND CORONARY ANGIOGRAPHY (N/A) as a surgical intervention.  The patient's history has been reviewed, patient examined, no change in status, stable for surgery.  I have reviewed the patient's chart and labs.  Questions were answered to the patient's satisfaction.   Cath Lab Visit (complete for each Cath Lab visit)  Clinical Evaluation Leading to the Procedure:   ACS: Yes.    Non-ACS:    Anginal Classification: CCS III  Anti-ischemic medical therapy: Minimal Therapy (1 class of medications)  Non-Invasive Test Results: No non-invasive testing performed  Prior CABG: No previous CABG        Colleen Dillon [facility] 10/03/2018 5:29 PM

## 2018-10-03 NOTE — ED Notes (Signed)
[facility] on unit to transfer pt to [facility] Cath lab, pt off unit via stretcher, personal property sent with pt. [facility] Cath lab made aware pt is in route.

## 2018-10-03 NOTE — Plan of Care (Signed)
  Problem: Education: Goal: Knowledge of General Education information will improve Description: Including pain rating scale, medication(s)/side effects and non-pharmacologic comfort measures 10/03/2018 2146 by Nelia Shi, RN Outcome: Progressing 10/03/2018 2146 by Nelia Shi, RN Outcome: Progressing

## 2018-10-03 NOTE — ED Notes (Signed)
Patient transported to X-ray 

## 2018-10-03 NOTE — ED Provider Notes (Signed)
[facility]   CSN: [id number] Arrival date & time: 10/03/18  1106     History   Chief Complaint Chief Complaint  Patient presents with   Chest Pain   Near Syncope    HPI Colleen Dillon is a 53 y.o. female.     The history is provided by the patient and medical records. No language interpreter was used.  Chest Pain Pain location:  Substernal area Pain quality: aching, dull, pressure and sharp   Pain radiates to:  L jaw and L shoulder Pain severity:  Moderate Onset quality:  Gradual Duration:  1 day Timing:  Constant Progression:  Waxing and waning Chronicity:  Recurrent Worsened by:  Exertion Ineffective treatments:  None tried Associated symptoms: fatigue   Associated symptoms: no abdominal pain, no altered mental status, no anorexia, no back pain, no cough, no diaphoresis, no dizziness, no fever, no headache, no lower extremity edema, no nausea, no near-syncope, no palpitations, no shortness of breath, no syncope and no vomiting   Risk factors: coronary artery disease     Past Medical History:  Diagnosis Date   Anxiety    CAD (coronary artery disease)    Cardiac murmur    Depression    Gallstones    GERD (gastroesophageal reflux disease)    History of echocardiogram    Echo 5/18: EF 60-65, and normal wall motion, grade 1 diastolic dysfunction   History of PSVT (paroxysmal supraventricular tachycardia) 2018   monitor    History of smoking    Hyperlipidemia    Obesity    Old MI (myocardial infarction) 2006   Pre-syncope 2018    Patient Active Problem List   Diagnosis Date Noted   Preoperative cardiovascular examination 08/28/2017   Statin intolerance 08/28/2017   SVT (supraventricular tachycardia) (HCC) 08/15/2016   Near syncope 08/15/2016   DOE (dyspnea on exertion) 06/30/2015   Chest pain 09/27/2012   Obesity (BMI 35.0-39.9 without comorbidity) 09/27/2012   Coronary artery  disease involving native coronary artery of native heart without angina pectoris 09/27/2012   Mixed hyperlipidemia 09/27/2012   Fatigue 09/27/2012    Past Surgical History:  Procedure Laterality Date   ABDOMINAL HYSTERECTOMY  2002   CARDIAC CATHETERIZATION  05/18/2004   90% RCA stenosis, stented w/ a [employer] Liberte [id number] bare metal stent - postdilated at 10atmx10sec, stented w/ a second [employer] Liberte 4.5x7216mm bare metal stent at 15atmx30sec, resulting in TIMI III flow.   CARDIAC CATHETERIZATION  09/08/2004   No intervention - continue medical therapy   CARDIAC CATHETERIZATION  03/21/2005   Distal RCA 50-60% in-stent restenosis, stented w/ a DES [employer] Taxus Express II 3.5x7932mm stent - postdilated at 20atmx31sec, resulting in TIMI III flow-60% stenosis reduced to 0-10%   CARDIAC CATHETERIZATION  05/23/2005   No intervention - continue medical therapy   CARDIAC CATHETERIZATION  11/02/2008   No intervention - continue medical therapy   CARDIAC SURGERY  2006   stent placement   CARDIOVASCULAR STRESS TEST  10/02/2012   No significant ST segment change suggestive of ischemia   CESAREAN SECTION  1987   CHOLECYSTECTOMY N/A 08/31/2017   Procedure: LAPAROSCOPIC CHOLECYSTECTOMY WITH INTRAOPERATIVE CHOLANGIOGRAM;  Surgeon: Axel Filleramirez, Armando, MD;  [facility];  Service: General;  Laterality: N/A;   COLONOSCOPY     TEAR DUCT PROBING Left 05/02/2017   Procedure: NASAL LACRIMAL DUCT EXPLORATION WITH LACRICATH BALLOON CANALICULOPLASTY LEFT EYE;  Surgeon: Aura CampsSpencer, Michael, MD;  [facility] [place];  Service: Ophthalmology;  Laterality: Left;   TRANSTHORACIC ECHOCARDIOGRAM  07/30/2012   EF 50-55%, moderate mitral regurg   TUBAL LIGATION       OB History   No obstetric history on file.      Home Medications    Prior to Admission medications   Medication Sig Start Date End Date Taking? Authorizing Provider  acetaminophen (TYLENOL) 325 MG tablet  Take 650 mg by mouth every 6 (six) hours as needed for mild pain.    [provider]  aspirin EC 81 MG tablet Take 81 mg by mouth every morning.     [provider]  buPROPion (WELLBUTRIN XL) 300 MG 24 hr tablet Take 300 mg by mouth daily.    [provider]  clopidogrel (PLAVIX) 75 MG tablet Take 1 tablet (75 mg total) by mouth every morning. 03/22/18   Hilty, Lisette AbuKenneth C, MD  doxycycline (VIBRA-TABS) 100 MG tablet Take 1 tablet (100 mg total) by mouth 2 (two) times daily. 1 po bid 06/14/18   Bennie PieriniMartin, Mary-Margaret, FNP  Investigational - Study Medication Study name: Clear Additional study details: NO LIPID TESTING    [provider]  metoprolol tartrate (LOPRESSOR) 25 MG tablet TAKE 1/2 TABLET BY MOUTH TWICE A DAY 06/12/18   Hilty, Lisette AbuKenneth C, MD  nitroGLYCERIN (NITROSTAT) 0.4 MG SL tablet Place 1 tablet (0.4 mg total) under the tongue every 5 (five) minutes as needed for chest pain. 08/15/16   Hilty, Lisette AbuKenneth C, MD  ondansetron (ZOFRAN ODT) 8 MG disintegrating tablet Take 1 tablet (8 mg total) by mouth every 8 (eight) hours as needed for nausea or vomiting. 08/20/17   Linwood DibblesKnapp, Jon, MD  pantoprazole (PROTONIX) 40 MG tablet Take 1 tablet (40 mg total) by mouth at bedtime. 05/12/14   Hilty, Lisette AbuKenneth C, MD  valACYclovir (VALTREX) 1000 MG tablet Take 1 tablet (1,000 mg total) by mouth 2 (two) times daily. For 1 day 06/14/18   Bennie PieriniMartin, Mary-Margaret, FNP    Family History Family History  Problem Relation Age of Onset   Ulcers Father 4340       Peptic ulcers   Cancer Father 5563   Hypertension Brother 35   Heart disease Maternal Grandfather 2555   Hypertension Brother 6534   Aneurysm Mother 3960       Dissected   Hypertension Mother 7560   Arthritis Mother 3450       Rheumatoid arthritis    Social History Social History   Tobacco Use   Smoking status: Former Smoker   Smokeless tobacco: Never Used   Tobacco comment: quit smoking cigarettes > 35 years ago  Substance  Use Topics   Alcohol use: No   Drug use: No     Allergies   Crestor [rosuvastatin], Lovastatin, Niaspan [niacin er], Penicillins, Pitavastatin, and Zocor [simvastatin]   Review of Systems Review of Systems  Constitutional: Positive for fatigue. Negative for chills, diaphoresis and fever.  HENT: Negative for congestion.   Respiratory: Negative for cough, chest tightness, shortness of breath, wheezing and stridor.   Cardiovascular: Positive for chest pain. Negative for palpitations, leg swelling, syncope and near-syncope.  Gastrointestinal: Negative for abdominal pain, anorexia, constipation, diarrhea, nausea and vomiting.  Genitourinary: Negative for dysuria.  Musculoskeletal: Negative for back pain.  Neurological: Positive for light-headedness. Negative for dizziness, syncope and headaches.  Psychiatric/Behavioral: Negative for agitation.  All other systems reviewed and are negative.    Physical Exam Updated Vital Signs BP (!) 152/85 (BP Location: Right Arm)    Pulse Marland Kitchen(!)  58    Temp 98.2 F (36.8 C) (Oral)    Resp 18    SpO2 100%   Physical Exam Vitals signs and nursing note reviewed.  Constitutional:      General: She is not in acute distress.    Appearance: She is well-developed. She is not ill-appearing, toxic-appearing or diaphoretic.  HENT:     Head: Normocephalic and atraumatic.  Eyes:     Conjunctiva/sclera: Conjunctivae normal.     Pupils: Pupils are equal, round, and reactive to light.  Neck:     Musculoskeletal: Neck supple.  Cardiovascular:     Rate and Rhythm: Normal rate and regular rhythm.     Heart sounds: No murmur.  Pulmonary:     Effort: Pulmonary effort is normal. No tachypnea or respiratory distress.     Breath sounds: Normal breath sounds. No decreased breath sounds, wheezing or rhonchi.  Abdominal:     Palpations: Abdomen is soft.     Tenderness: There is no abdominal tenderness.  Musculoskeletal:     Right lower leg: She exhibits no  tenderness. No edema.     Left lower leg: She exhibits no tenderness. No edema.  Skin:    General: Skin is warm and dry.     Capillary Refill: Capillary refill takes less than 2 seconds.  Neurological:     General: No focal deficit present.     Mental Status: She is alert.  Psychiatric:        Mood and Affect: Mood normal.      ED Treatments / Results  Labs (all labs ordered are listed, but only abnormal results are displayed) Labs Reviewed  BASIC METABOLIC PANEL - Abnormal; Notable for the following components:      Result Value   BUN 21 (*)    Creatinine, Ser 1.03 (*)    All other components within normal limits  CBC - Abnormal; Notable for the following components:   WBC 3.9 (*)    All other components within normal limits  SARS CORONAVIRUS 2 (HOSPITAL ORDER, PERFORMED IN [place] [facility])  TROPONIN I (HIGH SENSITIVITY)  HEPATIC FUNCTION PANEL  LIPASE, BLOOD  TROPONIN I (HIGH SENSITIVITY)    EKG EKG Interpretation  Date/Time:  Thursday October 03 2018 11:16:12 EDT Ventricular Rate:  54 PR Interval:    QRS Duration: 94 QT Interval:  415 QTC Calculation: 394 R Axis:   41 Text Interpretation:  Sinus rhythm Low voltage, precordial leads When compared to prior, no significant changes seen.  No STEMI Confirmed by Theda Belfastegeler, Chris ([id number]) on 10/03/2018 11:42:46 AM   Radiology Dg Chest 2 View  Result Date: 10/03/2018 CLINICAL DATA:  Chest pressure today. EXAM: CHEST - 2 VIEW COMPARISON:  Aug 20, 2017 FINDINGS: The heart size and mediastinal contours are within normal limits. Both lungs are clear. The visualized skeletal structures are unremarkable. IMPRESSION: No active cardiopulmonary disease. Electronically Signed   By: Sherian ReinWei-Chen  Lin M.D.   On: 10/03/2018 12:21    Procedures Procedures (including critical care time)  CRITICAL CARE Performed by: Canary Brimhristopher J [place] Korver Total critical care time: 35 minutes Critical care time was exclusive of separately billable  procedures and treating other patients. Critical care was necessary to treat or prevent imminent or life-threatening deterioration. Critical care was time spent personally by me on the following activities: development of treatment plan with patient and/or surrogate as well as nursing, discussions with consultants, evaluation of patient's response to treatment, examination of patient, obtaining history from patient or  surrogate, ordering and performing treatments and interventions, ordering and review of laboratory studies, ordering and review of radiographic studies, pulse oximetry and re-evaluation of patient's condition.   Medications Ordered in ED Medications - No data to display   Initial Impression / Assessment and Plan / ED Course  I have reviewed the triage vital signs and the nursing notes.  Pertinent labs & imaging results that were available during my care of the patient were reviewed by me and considered in my medical decision making (see chart for details).        Colleen Dillon is a 53 y.o. female with a past medical history significant for CAD status post PCI, hyperlipidemia, and prior SVT who presents with exertional shortness of breath, near syncope, and chest pain.  Patient reports that over the last 3 days she has been feeling very fatigued and then today started having a pinching chest pain.  She reports this pinching does feel similar to her prior MI where she had to have a heart cath and stent placement.  She says that after the pinching she has had chest pressure that was initially around a 5 or 6 out of 10 and is now currently a 2 out of 10.  She reports that she tried to walk up stairs but with exertion the symptoms worsened and she got lightheaded and her vision started going dark.  She reports he nearly passed out but was able to rest and improved.  She denies palpitations.  She denies significant diaphoresis nausea, or vomiting.  She reports the chest pressure does  radiate into her left jaw and left shoulder.  She is a cardiology patient of Dr. Debara Pickett and called to try to get an appointment this afternoon but when her symptoms persisted she came to the emergency department.  She denies any recent medication changes or other complaints.  On exam, chest is nontender and I cannot reproduce the same pain.  Lungs were clear.  Mild systolic murmur.  Abdomen nontender.  Normal pulses in all extremities.  Legs had minimal edema.  EKG shows no STEMI.  Clinically I am concerned about the patient's chest pain similar to prior MI.  Patient will have screen laboratory testing, chest x-ray, and cardiology will be called for further management.  Initial laboratory testing began return reassuring.  Initial troponin negative.  Chest x-ray reassuring.  Labs otherwise overall reassuring however, cardio was called due to the concerning story.  Cardiology came to the patient and are also concerned about unstable angina.  They will start heparin on the patient and take her to the Cath Lab at [facility] this evening and better.  Patient will be admitted for further management.   Final Clinical Impressions(s) / ED Diagnoses   Final diagnoses:  Precordial pain    Clinical Impression: 1. Precordial pain     Disposition: Admit  This note was prepared with assistance of Dragon voice recognition software. Occasional wrong-word or sound-a-like substitutions may have occurred due to the inherent limitations of voice recognition software.      Legend Tumminello, Gwenyth Allegra, MD 10/03/18 [phone]

## 2018-10-03 NOTE — ED Notes (Signed)
This nurse attempted to obtain IV access x 2 with no success. IV team STAT consult placed, this nurse also reached out to [place] IV RN in the department.

## 2018-10-03 NOTE — H&P (View-Only) (Signed)
Cardiology Consultation:   Patient ID: Colleen Dillon MRN: [id number]; DOB: 11-19-1965  Admit date: 10/03/2018 Date of Consult: 10/03/2018  Primary Care Provider: Blair HeysEhinger, Robert, MD Primary Cardiologist: Dr Rennis GoldenHilty  Patient Profile:   Colleen Dillon is a 53 y.o. female with a hx of CAD, s/p PCI RCA in 2006 who is being seen today for the evaluation of chest pain at the request of D Tegeler.  History of Present Illness:   Ms. Colleen Dillon a pleasant 53 y.o. 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 Alanda AmassWeintraub - here to establish care with me today. Patient had acute DMI in 2006 with single vessel RCA disease with placement of overlapping bare metal stents. A recatheterization for repeat angina the same day and required subsequent DES stenting of the distal RCA and PLA branches peeling occluded at that time. Last catheterization was in 2010 showed 30-50% narrowing of the acute margin. The overlapping DES stents and IVUS interrogation showed good residual lumen. His chronic PDA occlusion with collaterals from the left. She ruled out for MI in July 2012 last nuclear stress test was 10/17/2010 showed no definite inducible or reversible ischemia the EF of 58%.  Last echo was April 2014 showed ejection fraction of 50-55% with normal wall motion. Is moderate mitral valve regurgitation. Currently works as a Engineer, civil (consulting)nurse at [employer]. Last nuclear stress test on 10/02/2012 which was negative for ischemia.  She has known intolerance to statins, followed by Dr Rennis GoldenHilty, currently enrolled in CLEAR study. She was last seen in 08/2017 when she was doing well.   She has noticed worsening fatigue and DOE in the last few weeks, today at work she was unusually tired, developed chest pressures very similar to pre -stenting chest pains, she then developed presyncopal episode associated with chest pressure while in the elevator.  She is still tired, but chest pain has resolved with NTG. Her ECG shows SB, otherwise normal ECG, unchanged from prior. ECG, troponin is negative, labs - normal crea, electrolytes, CBC.  Heart Pathway Score:     Past Medical History:  Diagnosis Date  . Anxiety   . CAD (coronary artery disease)   . Cardiac murmur   . Depression   . Gallstones   . GERD (gastroesophageal reflux disease)   . History of echocardiogram    Echo 5/18: EF 60-65, and normal wall motion, grade 1 diastolic dysfunction  . History of PSVT (paroxysmal supraventricular tachycardia) 2018   monitor   . History of smoking   . Hyperlipidemia   . Obesity   . Old MI (myocardial infarction) 2006  . Pre-syncope 2018    Past Surgical History:  Procedure Laterality Date  . ABDOMINAL HYSTERECTOMY  2002  . CARDIAC CATHETERIZATION  05/18/2004   90% RCA stenosis, stented w/ a [employer] Liberte 5.0x320mm bare metal stent - postdilated at 10atmx10sec, stented w/ a second [employer] Liberte 4.5x2816mm bare metal stent at 15atmx30sec, resulting in TIMI III flow.  Marland Kitchen. CARDIAC CATHETERIZATION  09/08/2004   No intervention - continue medical therapy  . CARDIAC CATHETERIZATION  03/21/2005   Distal RCA 50-60% in-stent restenosis, stented w/ a DES Scimed Taxus Express II 3.5x5732mm stent - postdilated at 20atmx31sec, resulting in TIMI III flow-60% stenosis reduced to 0-10%  . CARDIAC CATHETERIZATION  05/23/2005   No intervention - continue medical therapy  . CARDIAC CATHETERIZATION  11/02/2008   No intervention - continue medical therapy  . CARDIAC  SURGERY  2006   stent placement  . CARDIOVASCULAR STRESS TEST  10/02/2012   No significant ST segment change suggestive of ischemia  . CESAREAN SECTION  1987  . CHOLECYSTECTOMY N/A 08/31/2017   Procedure: LAPAROSCOPIC CHOLECYSTECTOMY WITH INTRAOPERATIVE CHOLANGIOGRAM;  Surgeon: Ralene Ok, MD;  Location: [place];  Service: General;  Laterality: N/A;  . COLONOSCOPY    . TEAR DUCT  PROBING Left 05/02/2017   Procedure: NASAL LACRIMAL DUCT EXPLORATION WITH LACRICATH BALLOON CANALICULOPLASTY LEFT EYE;  Surgeon: Gevena Cotton, MD;  Location: [facility];  Service: Ophthalmology;  Laterality: Left;  . TRANSTHORACIC ECHOCARDIOGRAM  07/30/2012   EF 50-55%, moderate mitral regurg  . TUBAL LIGATION       Home Medications:  Prior to Admission medications   Medication Sig Start Date End Date Taking? Authorizing Provider  acetaminophen (TYLENOL) 325 MG tablet Take 650 mg by mouth every 6 (six) hours as needed for mild pain.   Yes [provider]  aspirin EC 81 MG tablet Take 81 mg by mouth every morning.    Yes [provider]  buPROPion (WELLBUTRIN XL) 300 MG 24 hr tablet Take 300 mg by mouth daily.   Yes [provider]  clopidogrel (PLAVIX) 75 MG tablet Take 1 tablet (75 mg total) by mouth every morning. 03/22/18  Yes Hilty, Nadean Corwin, MD  Investigational - Study Medication Study name: Clear Additional study details: NO LIPID TESTING   Yes [provider]  metoprolol tartrate (LOPRESSOR) 25 MG tablet TAKE 1/2 TABLET BY MOUTH TWICE A DAY Patient taking differently: Take 25 mg by mouth daily.  06/12/18  Yes Hilty, Nadean Corwin, MD  nitroGLYCERIN (NITROSTAT) 0.4 MG SL tablet Place 1 tablet (0.4 mg total) under the tongue every 5 (five) minutes as needed for chest pain. 08/15/16  Yes Hilty, Nadean Corwin, MD  doxycycline (VIBRA-TABS) 100 MG tablet Take 1 tablet (100 mg total) by mouth 2 (two) times daily. 1 po bid Patient not taking: Reported on 10/03/2018 06/14/18   Chevis Pretty, FNP  ondansetron (ZOFRAN ODT) 8 MG disintegrating tablet Take 1 tablet (8 mg total) by mouth every 8 (eight) hours as needed for nausea or vomiting. Patient not taking: Reported on 10/03/2018 08/20/17   Dorie Rank, MD  pantoprazole (PROTONIX) 40 MG tablet Take 1 tablet (40 mg total) by mouth at bedtime. Patient not taking: Reported on 10/03/2018 05/12/14   Pixie Casino, MD  valACYclovir (VALTREX) 1000 MG tablet Take 1 tablet (1,000 mg total) by mouth 2 (two) times daily. For 1 day Patient not taking: Reported on 10/03/2018 06/14/18   Chevis Pretty, FNP    Inpatient Medications: Scheduled Meds:  Continuous Infusions:  PRN Meds:   Allergies:    Allergies  Allergen Reactions  . Crestor [Rosuvastatin]     Myalgia   . Lovastatin Other (See Comments)    Myalgias   . Niaspan [Niacin Er] Itching  . Penicillins Other (See Comments)    Child hood allergy Has patient had a PCN reaction causing immediate rash, facial/tongue/throat swelling, SOB or lightheadedness with hypotension: No  Has patient had a PCN reaction causing severe rash involving mucus membranes or skin necrosis: No Has patient had a PCN reaction that required hospitalization: No Has patient had a PCN reaction occurring within the last 10 years: No If all of the above answers are "NO", then may proceed with Cephalosporin use.  . Pitavastatin Other (See Comments)    Joints ache  . Zocor [Simvastatin] Other (See  Comments)    Myalgias     Social History:   Social History   Socioeconomic History  . Marital status: Married    Spouse name: Not on file  . Number of children: Not on file  . Years of education: Not on file  . Highest education level: Not on file  Occupational History  . Not on file  Social Needs  . Financial resource strain: Not on file  . Food insecurity    Worry: Not on file    Inability: Not on file  . Transportation needs    Medical: Not on file    Non-medical: Not on file  Tobacco Use  . Smoking status: Former Games developermoker  . Smokeless tobacco: Never Used  . Tobacco comment: quit smoking cigarettes > 35 years ago  Substance and Sexual Activity  . Alcohol use: No  . Drug use: No  . Sexual activity: Not on file  Lifestyle  . Physical activity    Days per week: Not on file    Minutes per session: Not on file  . Stress: Not on file   Relationships  . Social Musicianconnections    Talks on phone: Not on file    Gets together: Not on file    Attends religious service: Not on file    Active member of club or organization: Not on file    Attends meetings of clubs or organizations: Not on file    Relationship status: Not on file  . Intimate partner violence    Fear of current or ex partner: Not on file    Emotionally abused: Not on file    Physically abused: Not on file    Forced sexual activity: Not on file  Other Topics Concern  . Not on file  Social History Narrative  . Not on file    Family History:    Family History  Problem Relation Age of Onset  . Ulcers Father 40       Peptic ulcers  . Cancer Father [id number]  . Hypertension Brother 35  . Heart disease Maternal Grandfather 55  . Hypertension Brother 34  . Aneurysm Mother 60       Dissected  . Hypertension Mother [id number]  . Arthritis Mother [id number]       Rheumatoid arthritis    ROS:  Please see the history of present illness.  All other ROS reviewed and negative.     Physical Exam/Data:   Vitals:   10/03/18 1338 10/03/18 1345 10/03/18 1400 10/03/18 1415  BP: 119/72 123/83 128/75 (!) 122/56  Pulse: (!) 52 (!) 52 (!) 51 (!) 53  Resp: 15 16 20 20   Temp:      TempSrc:      SpO2: 100% 100% 100% 100%   No intake or output data in the 24 hours ending 10/03/18 1437 Last 3 Weights 08/31/2017 08/28/2017 08/20/2017  Weight (lbs) 201 lb 203 lb 202 lb  Weight (kg) 91.173 kg 92.08 kg 91.627 kg     There is no height or weight on file to calculate BMI.  General:  Well nourished, well developed, in no acute distress HEENT: normal Lymph: no adenopathy Neck: no JVD Endocrine:  No thryomegaly Vascular: No carotid bruits; FA pulses 2+ bilaterally without bruits  Cardiac:  normal S1, S2; RRR; no murmur  Lungs:  clear to auscultation bilaterally, no wheezing, rhonchi or rales  Abd: soft, nontender, no hepatomegaly  Ext: no edema Musculoskeletal:  No deformities, BUE and BLE  strength  normal and equal Skin: warm and dry  Neuro:  CNs 2-12 intact, no focal abnormalities noted Psych:  Normal affect   EKG:  The EKG was personally reviewed and demonstrates:  SB, normal ECG unchanged from prior Telemetry:  Telemetry was personally reviewed and demonstrates:  SB  Relevant CV Studies:  TTE - Left ventricle: The cavity size was normal. Systolic function was   normal. The estimated ejection fraction was in the range of 60%   to 65%. Wall motion was normal; there were no regional wall   motion abnormalities. Doppler parameters are consistent with   abnormal left ventricular relaxation (grade 1 diastolic   dysfunction). Doppler parameters are consistent with   indeterminate ventricular filling pressure. - Aortic valve: Transvalvular velocity was within the normal range.   There was no stenosis. There was no regurgitation. - Mitral valve: Transvalvular velocity was within the normal range.   There was no evidence for stenosis. There was trivial   regurgitation. - Right ventricle: The cavity size was normal. Wall thickness was   normal. Systolic function was normal. - Tricuspid valve: There was no regurgitation. - Pulmonary arteries: Systolic pressure was within the normal   range.  Laboratory Data:  High Sensitivity Troponin:   Recent Labs  Lab 10/03/18 1313  TROPONINIHS 2     Cardiac EnzymesNo results for input(s): TROPONINI in the last 168 hours. No results for input(s): TROPIPOC in the last 168 hours.  Chemistry Recent Labs  Lab 10/03/18 1313  NA 141  K 3.8  CL 106  CO2 25  GLUCOSE 94  BUN 21*  CREATININE 1.03*  CALCIUM 9.1  GFRNONAA >60  GFRAA >60  ANIONGAP 10    Recent Labs  Lab 10/03/18 1257  PROT 7.7  ALBUMIN 4.4  AST 16  ALT 13  ALKPHOS 65  BILITOT 0.6   Hematology Recent Labs  Lab 10/03/18 1313  WBC 3.9*  RBC 3.88  HGB 12.1  HCT 37.0  MCV 95.4  MCH 31.2  MCHC 32.7  RDW 12.5  PLT 309   BNPNo results for input(s):  BNP, PROBNP in the last 168 hours.  DDimer No results for input(s): DDIMER in the last 168 hours.   Radiology/Studies:  Dg Chest 2 View  Result Date: 10/03/2018 CLINICAL DATA:  Chest pressure today. EXAM: CHEST - 2 VIEW COMPARISON:  Aug 20, 2017 FINDINGS: The heart size and mediastinal contours are within normal limits. Both lungs are clear. The visualized skeletal structures are unremarkable. IMPRESSION: No active cardiopulmonary disease. Electronically Signed   By: Sherian ReinWei-Chen  Lin M.D.   On: 10/03/2018 12:21    Assessment and Plan:   1. Unstable angina, known CAD with prior overlapping stents to RCA, prior 30-50% narrowing of the acute marginal. Chronic PDA occlusion with collaterals from the left.  - start heparin drip - continue ASA, plavix - [facility] if her Covid test comes back negative - no statins given enrollment in CLEAR study - continue low dose metoprolol  2. Hypertension  - controlled  3. Hyperlipidemia - we will obtain fasting lipid panel, if lipids elevated and she has a new lesion on a cath we will discuss appropriate treatment with Dr Rennis GoldenHilty  For questions or updates, please contact [facility] Please consult [email or web address] for contact info under   Signed, Tobias AlexanderKatarina Kanasia Gayman, MD  10/03/2018 2:37 PM

## 2018-10-03 NOTE — ED Notes (Signed)
Attempted to call nursing report to [facility] 3 E, informed nurse will call back when available.

## 2018-10-04 DIAGNOSIS — R0789 Other chest pain: Secondary | ICD-10-CM

## 2018-10-04 LAB — CBC
HCT: 35 % — ABNORMAL LOW (ref 36.0–46.0)
Hemoglobin: 11.5 g/dL — ABNORMAL LOW (ref 12.0–15.0)
MCH: 30.7 pg (ref 26.0–34.0)
MCHC: 32.9 g/dL (ref 30.0–36.0)
MCV: 93.6 fL (ref 80.0–100.0)
Platelets: 298 10*3/uL (ref 150–400)
RBC: 3.74 MIL/uL — ABNORMAL LOW (ref 3.87–5.11)
RDW: 12.4 % (ref 11.5–15.5)
WBC: 3.6 10*3/uL — ABNORMAL LOW (ref 4.0–10.5)
nRBC: 0 % (ref 0.0–0.2)

## 2018-10-04 LAB — BASIC METABOLIC PANEL
Anion gap: 10 (ref 5–15)
BUN: 15 mg/dL (ref 6–20)
CO2: 23 mmol/L (ref 22–32)
Calcium: 9 mg/dL (ref 8.9–10.3)
Chloride: 109 mmol/L (ref 98–111)
Creatinine, Ser: 0.94 mg/dL (ref 0.44–1.00)
GFR calc Af Amer: >60 mL/min (ref >60)
GFR calc non Af Amer: >60 mL/min (ref >60)
Glucose, Bld: 99 mg/dL (ref 70–99)
Potassium: 3.9 mmol/L (ref 3.5–5.1)
Sodium: 142 mmol/L (ref 135–145)

## 2018-10-04 LAB — LIPID PANEL
Cholesterol: 145 mg/dL (ref 0–200)
HDL: 41 mg/dL (ref >40)
LDL Cholesterol: 77 mg/dL (ref 0–99)
Total CHOL/HDL Ratio: 3.5 RATIO
Triglycerides: 134 mg/dL (ref <150)
VLDL: 27 mg/dL (ref 0–40)

## 2018-10-04 LAB — HIV ANTIBODY (ROUTINE TESTING W REFLEX): HIV Screen 4th Generation wRfx: NONREACTIVE

## 2018-10-04 NOTE — Progress Notes (Addendum)
DAILY PROGRESS NOTE   Patient Name: Colleen Dillon Date of Encounter: 10/04/2018 Cardiologist: No primary care provider on file.  Chief Complaint   Chest pain  Patient Profile   53 yo female with CAD prior MI with PCI to the RCA in 2006, moderate obesity, statin intolerance and SVT, presented with chest pain and negative troponins.  Subjective   Cath yesterday personally reviewed - this demonstrates mild, diffuse ISR to the RCA with improvement in the PDA that was occluded and now recanalized without collaterals. She reports her symptoms have resolved. Labs improved today - she is enrolled in the CLEAR study - lipids appear improved and near goal LDL <70 (I'm not the PI on this study, therefore, not blinded to the results). The patient was not informed to the lipid levels.  Objective   Vitals:   10/03/18 1901 10/03/18 1954 10/04/18 0409 10/04/18 0835  BP: 129/63 123/75 (!) 98/55 (!) 104/33  Pulse: (!) 51  (!) 59 69  Resp:  18 16   Temp:  97.9 F (36.6 C) 97.9 F (36.6 C) 97.6 F (36.4 C)  TempSrc:  Oral Oral Oral  SpO2:  99% 98% 96%  Weight:   90.7 kg   Height:        Intake/Output Summary (Last 24 hours) at 10/04/2018 16100907 Last data filed at 10/04/2018 0845 Gross per 24 hour  Intake 600 ml  Output 600 ml  Net 0 ml   Filed Weights   10/03/18 1709 10/03/18 1842 10/04/18 0409  Weight: 92 kg 89.8 kg 90.7 kg    Physical Exam   General appearance: alert and no distress Lungs: clear to auscultation bilaterally Heart: regular rate and rhythm, S1, S2 normal, no murmur, click, rub or gallop Extremities: extremities normal, atraumatic, no cyanosis or edema and right radial cath site intact, no ecchymosis, hematoma or bruit Neurologic: Grossly normal  Inpatient Medications    Scheduled Meds: . aspirin  324 mg Oral NOW   Or  . aspirin  300 mg Rectal NOW  . aspirin EC  81 mg Oral Daily  . buPROPion  300 mg Oral Daily  . clopidogrel  75 mg Oral q morning - 10a  .  metoprolol tartrate  12.5 mg Oral BID  . pantoprazole  40 mg Oral QHS  . sodium chloride flush  3 mL Intravenous Q12H    Continuous Infusions: . sodium chloride      PRN Meds: sodium chloride, acetaminophen, nitroGLYCERIN, ondansetron (ZOFRAN) IV, sodium chloride flush   Labs   Results for orders placed or performed during the hospital encounter of 10/03/18 (from the past 48 hour(s))  Hepatic function panel     Status: None   Collection Time: 10/03/18 12:57 PM  Result Value Ref Range   Total Protein 7.7 6.5 - 8.1 g/dL   Albumin 4.4 3.5 - 5.0 g/dL   AST 16 15 - 41 U/L   ALT 13 0 - 44 U/L   Alkaline Phosphatase 65 38 - 126 U/L   Total Bilirubin 0.6 0.3 - 1.2 mg/dL   Bilirubin, Direct 0.1 0.0 - 0.2 mg/dL   Indirect Bilirubin 0.5 0.3 - 0.9 mg/dL    Comment: Performed at [facility], [address] [place], [place] [address]  Lipase, blood     Status: None   Collection Time: 10/03/18 12:57 PM  Result Value Ref Range   Lipase 46 11 - 51 U/L    Comment: Performed at [facility], [address] [place], [place] [address]  Basic metabolic panel     Status: Abnormal   Collection Time: 10/03/18  1:13 PM  Result Value Ref Range   Sodium 141 135 - 145 mmol/L   Potassium 3.8 3.5 - 5.1 mmol/L   Chloride 106 98 - 111 mmol/L   CO2 25 22 - 32 mmol/L   Glucose, Bld 94 70 - 99 mg/dL   BUN 21 (H) 6 - 20 mg/dL   Creatinine, Ser 1.03 (H) 0.44 - 1.00 mg/dL   Calcium 9.1 8.9 - 10.3 mg/dL   GFR calc non Af Amer >60 >60 mL/min   GFR calc Af Amer >60 >60 mL/min   Anion gap 10 5 - 15    Comment: Performed at [facility], [place] [address] [place], [place] [address]  CBC     Status: Abnormal   Collection Time: 10/03/18  1:13 PM  Result Value Ref Range   WBC 3.9 (L) 4.0 - 10.5 K/uL   RBC 3.88 3.87 - 5.11 MIL/uL   Hemoglobin 12.1 12.0 - 15.0 g/dL   HCT 37.0 36.0 - 46.0 %   MCV 95.4 80.0 - 100.0 fL   MCH 31.2 26.0 - 34.0 pg   MCHC  32.7 30.0 - 36.0 g/dL   RDW 12.5 11.5 - 15.5 %   Platelets 309 150 - 400 K/uL   nRBC 0.0 0.0 - 0.2 %    Comment: Performed at [facility], [place] [address] [place], [place] [address]  Troponin I (High Sensitivity)     Status: None   Collection Time: 10/03/18  1:13 PM  Result Value Ref Range   Troponin I (High Sensitivity) 2 <18 ng/L    Comment: (NOTE) Elevated high sensitivity troponin I (hsTnI) values and significant  changes across serial measurements may suggest ACS but many other  chronic and acute conditions are known to elevate hsTnI results.  Refer to the "Links" section for chest pain algorithms and additional  guidance. Performed at [facility], [place] [address], [place], [place] [address]   SARS Coronavirus 2 (CEPHEID - Performed in [place] hospital lab), Hosp Order     Status: None   Collection Time: 10/03/18  3:03 PM   Specimen: Nasopharyngeal Swab  Result Value Ref Range   SARS Coronavirus 2 NEGATIVE NEGATIVE    Comment: (NOTE) If result is NEGATIVE SARS-CoV-2 target nucleic acids are NOT DETECTED. The SARS-CoV-2 RNA is generally detectable in upper and lower  respiratory specimens during the acute phase of infection. The lowest  concentration of SARS-CoV-2 viral copies this assay can detect is 250  copies / mL. A negative result does not preclude SARS-CoV-2 infection  and should not be used as the sole basis for treatment or other  patient management decisions.  A negative result may occur with  improper specimen collection / handling, submission of specimen other  than nasopharyngeal swab, presence of viral mutation(s) within the  areas targeted by this assay, and inadequate number of viral copies  (<250 copies / mL). A negative result must be combined with clinical  observations, patient history, and epidemiological information. If result is POSITIVE SARS-CoV-2 target nucleic acids are DETECTED. The SARS-CoV-2 RNA is  generally detectable in upper and lower  respiratory specimens dur ing the acute phase of infection.  Positive  results are indicative of active infection with SARS-CoV-2.  Clinical  correlation with patient history and other diagnostic information is  necessary to determine patient  infection status.  Positive results do  not rule out bacterial infection or co-infection with other viruses. If result is PRESUMPTIVE POSTIVE SARS-CoV-2 nucleic acids MAY BE PRESENT.   A presumptive positive result was obtained on the submitted specimen  and confirmed on repeat testing.  While 2019 novel coronavirus  (SARS-CoV-2) nucleic acids may be present in the submitted sample  additional confirmatory testing may be necessary for epidemiological  and / or clinical management purposes  to differentiate between  SARS-CoV-2 and other Sarbecovirus currently known to infect humans.  If clinically indicated additional testing with an alternate test  methodology [phone] is advised. The SARS-CoV-2 RNA is generally  detectable in upper and lower respiratory sp ecimens during the acute  phase of infection. The expected result is Negative. Fact Sheet for Patients:  BoilerBrush.com.cyhttps://www.fda.gov/media/136312/download Fact Sheet for Healthcare Providers: https://pope.com/https://www.fda.gov/media/136313/download This test is not yet approved or cleared by the [place] FDA and has been authorized for detection and/or diagnosis of SARS-CoV-2 by FDA under an Emergency Use Authorization (EUA).  This EUA will remain in effect (meaning this test can be used) for the duration of the COVID-19 declaration under Section 564(b)(1) of the Act, 21 U.S.C. section 360bbb-3(b)(1), unless the authorization is terminated or revoked sooner. Performed at [facility], [address] [place], [place] [address]   Troponin I (High Sensitivity)     Status: None   Collection Time: 10/03/18  3:14 PM  Result Value Ref Range   Troponin  I (High Sensitivity) 3.0 <18 ng/L    Comment: (NOTE) Elevated high sensitivity troponin I (hsTnI) values and significant  changes across serial measurements may suggest ACS but many other  chronic and acute conditions are known to elevate hsTnI results.  Refer to the "Links" section for chest pain algorithms and additional  guidance. Performed at [facility], [address] [place], [place] [address]   Protime-INR     Status: None   Collection Time: 10/03/18  3:42 PM  Result Value Ref Range   Prothrombin Time 12.6 11.4 - 15.2 seconds   INR 1.0 0.8 - 1.2    Comment: (NOTE) INR goal varies based on device and disease states. Performed at [facility], [address] [place], [place] [address]   APTT     Status: None   Collection Time: 10/03/18  3:42 PM  Result Value Ref Range   aPTT 30 24 - 36 seconds    Comment: Performed at [facility], [address] [place], [place] [address]  Basic metabolic panel     Status: None   Collection Time: 10/04/18  5:55 AM  Result Value Ref Range   Sodium 142 135 - 145 mmol/L   Potassium 3.9 3.5 - 5.1 mmol/L   Chloride 109 98 - 111 mmol/L   CO2 23 22 - 32 mmol/L   Glucose, Bld 99 70 - 99 mg/dL   BUN 15 6 - 20 mg/dL   Creatinine, Ser 0.860.94 0.44 - 1.00 mg/dL   Calcium 9.0 8.9 - 57.810.3 mg/dL   GFR calc non Af Amer >60 >60 mL/min   GFR calc Af Amer >60 >60 mL/min   Anion gap 10 5 - 15    Comment: Performed at [facility], [address] [place], [place] [address]  Lipid panel     Status: None   Collection Time: 10/04/18  5:55 AM  Result Value Ref Range   Cholesterol 145 0 - 200 mg/dL  Triglycerides 134 <150 mg/dL   HDL 41 >08>40 mg/dL   Total CHOL/HDL Ratio 3.5 RATIO   VLDL 27 0 - 40 mg/dL   LDL Cholesterol 77 0 - 99 mg/dL    Comment:        Total Cholesterol/HDL:CHD Risk Coronary Heart Disease Risk Table                     Men   Women  1/2 Average Risk   3.4   3.3   Average Risk       5.0   4.4  2 X Average Risk   9.6   7.1  3 X Average Risk  23.4   11.0        Use the calculated Patient Ratio above and the CHD Risk Table to determine the patient's CHD Risk.        ATP III CLASSIFICATION (LDL):  <100     mg/dL   Optimal  [phone]  mg/dL   Near or Above                    Optimal  130-159  mg/dL   Borderline  [phone]  mg/dL   High  >841>190     mg/dL   Very High Performed at [facility], [address] [place], [place] [address]   CBC     Status: Abnormal   Collection Time: 10/04/18  5:55 AM  Result Value Ref Range   WBC 3.6 (L) 4.0 - 10.5 K/uL   RBC 3.74 (L) 3.87 - 5.11 MIL/uL   Hemoglobin 11.5 (L) 12.0 - 15.0 g/dL   HCT 10.235.0 (L) 72.536.0 - 36.646.0 %   MCV 93.6 80.0 - 100.0 fL   MCH 30.7 26.0 - 34.0 pg   MCHC 32.9 30.0 - 36.0 g/dL   RDW 44.012.4 34.711.5 - 42.515.5 %   Platelets 298 150 - 400 K/uL   nRBC 0.0 0.0 - 0.2 %    Comment: Performed at [facility], [address] [place], [place] [address]    ECG   N/A  Telemetry   Sinus rhythm - Personally Reviewed  Radiology    Dg Chest 2 View  Result Date: 10/03/2018 CLINICAL DATA:  Chest pressure today. EXAM: CHEST - 2 VIEW COMPARISON:  Aug 20, 2017 FINDINGS: The heart size and mediastinal contours are within normal limits. Both lungs are clear. The visualized skeletal structures are unremarkable. IMPRESSION: No active cardiopulmonary disease. Electronically Signed   By: Sherian ReinWei-Chen  Lin M.D.   On: 10/03/2018 12:21    Cardiac Studies   LEFT HEART CATH AND CORONARY ANGIOGRAPHY  Conclusion    Mid RCA to Dist RCA lesion is 30% stenosed.  Dist RCA lesion is 45% stenosed.  Previously placed RPAV stent (unknown type) is widely patent.  The left ventricular systolic function is normal.  LV end diastolic pressure is normal.  The left ventricular ejection fraction is 55-65% by visual estimate.   1. Nonobstructive CAD involving the distal RCA with diffuse nonobstructive in stent  restenosis. The PDA is now patent 2. Normal LV function 3. Mildly elevated LVEDP  Plan: continue medical therapy. Anticipate DC in am    Assessment   Principal Problem:   Chest pain of uncertain etiology   Plan   1. [name] had chest pain concerning for unstable angina, however, ruled-out for MI with hsTroponin,. Cath showed mild ISR and improvement in her prior PDA stenosis which was collateralized.  She is asymptomatic today. On ASA, Plavix, metoprolol and enrolled in CLEAR study (bempedoic acid) - statin intolerant. No changes made to meds today. BP normal. Ok for d/c home today. Follow-up with me in 4-6 weeks (Can be virtual).  Time Spent Directly with Patient:  I have spent a total of 25 minutes with the patient reviewing hospital notes, telemetry, EKGs, labs and examining the patient as well as establishing an assessment and plan that was discussed personally with the patient.  > 50% of time was spent in direct patient care.  Length of Stay:  LOS: 1 day   [name], MD, [facility], FACP  [place]  [facility]  Medical Director of the Advanced Lipid Disorders &  Cardiovascular Risk Reduction Clinic Diplomate of the American Board of Clinical Lipidology Attending Cardiologist  Direct Dial: [phone]  Fax: [phone]  Website:  [email or web address] Nicely Rhyleigh Grassel 10/04/2018, 9:07 AM

## 2018-10-04 NOTE — Progress Notes (Signed)
Discharged patient home with family.  Discussed instructions including follow appointments and medications.  Also went over radial site care- encouraged pt to monitor for S&S of infection and bleeding.  Placed Band-Aid over site.  Site level 0, no pain, redness or numbness of the hand.

## 2018-10-04 NOTE — Discharge Instructions (Signed)

## 2018-10-04 NOTE — Discharge Summary (Signed)
Discharge Summary    Patient ID: Colleen Dillon,  MRN: [id number], DOB/AGE: 1966/02/29 53 y.o.  Admit date: 10/03/2018 Discharge date: 10/04/2018  Primary Care Provider: [name] Primary Cardiologist: No primary care provider on file.  Discharge Diagnoses    Principal Problem:   Chest pain of uncertain etiology Active Problems:   Obesity (BMI 35.0-39.9 without comorbidity)   Coronary artery disease involving native coronary artery of native heart without angina pectoris   Mixed hyperlipidemia   Allergies Allergies  Allergen Reactions  . Crestor [Rosuvastatin]     Myalgia   . Lovastatin Other (See Comments)    Myalgias   . Niaspan [Niacin Er] Itching  . Penicillins Other (See Comments)    Child hood allergy Has patient had a PCN reaction causing immediate rash, facial/tongue/throat swelling, SOB or lightheadedness with hypotension: No  Has patient had a PCN reaction causing severe rash involving mucus membranes or skin necrosis: No Has patient had a PCN reaction that required hospitalization: No Has patient had a PCN reaction occurring within the last 10 years: No If all of the above answers are "NO", then may proceed with Cephalosporin use.  . Pitavastatin Other (See Comments)    Joints ache  . Zocor [Simvastatin] Other (See Comments)    Myalgias     Diagnostic Studies/Procedures    Left heart catheterization 10/03/2018:  Mid RCA to Dist RCA lesion is 30% stenosed.  Dist RCA lesion is 45% stenosed.  Previously placed RPAV stent (unknown type) is widely patent.  The left ventricular systolic function is normal.  LV end diastolic pressure is normal.  The left ventricular ejection fraction is 55-65% by visual estimate.   1. Nonobstructive CAD involving the distal RCA with diffuse nonobstructive in stent restenosis. The PDA is now patent 2. Normal LV function 3. Mildly elevated LVEDP  Plan: continue medical therapy. Anticipate DC in am  _____________   History of Present Illness     Ms. 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 Rollene Fare - though wished to transition care to [facility]. Patient had acute MI in 2006 with single vessel RCA disease with placement of overlapping bare metal stents. A recatheterization for repeat angina the same day and required subsequent DES stenting of the distal RCA and PLA branches peeling occluded at that time. Last catheterization was in 2010 showed 30-50% narrowing of the acute margin. The overlapping DES stents and IVUS interrogation showed good residual lumen. His chronic PDA occlusion with collaterals from the left. She ruled out for MI in July 2012 last nuclear stress test was 10/17/2010 showed no definite inducible or reversible ischemia the EF of 58%. Last echo was April 2014 showed ejection fraction of 50-55% with normal wall motion. Is moderate mitral valve regurgitation. Currently works as a Marine scientist at [employer]. Last nuclear stress test on 10/02/2012 which was negative for ischemia.  She has known intolerance to statins, followed by Dr Debara Pickett, currently enrolled in CLEAR study. She was last seen in 08/2017 when she was doing well.   She has noticed worsening fatigue and DOE in the last few weeks, today at work she was unusually tired, developed chest pressures very similar to pre -stenting chest pains, she then developed presyncopal episode associated with chest pressure while in the elevator.  She is still tired, but chest pain has resolved with NTG. Her ECG shows SB, otherwise normal  ECG, unchanged from prior. ECG, troponin is negative, labs - normal crea, electrolytes, CBC.   Hospital Course     Consultants: None   1. Unstable angina in patient with CAD s/p PCI/DES to RCA: patient presented with progressive DOE and fatigue reminiscent of prior MI.  Her high sensitivity troponins were negative at 2>3. EKG without ischemic changes. She underwent a LHC 10/03/2018 which showed non-obstructive CAD of the RCA (detailed above), with 45% in-stent restenosis of previously placed dRCA. She was recommended for ongoing risk factor modifications.  - Continue aspirin, plavix, and metoprolol - She will continue participation in the CLEAR study due to prior statin intolerance.   2. HLD: prior intolerance to statins. Enrolled in the CLEAR study. Lipids this admission improved.  - Continue CLEAR study participation  3. HTN: BP stable this admission - Continue metoprolol _____________  Discharge Vitals Blood pressure (!) 104/33, pulse 69, temperature 97.6 F (36.4 C), temperature source Oral, resp. rate 16, height 5\' 2"  (1.575 m), weight 90.7 kg, SpO2 96 %.  Filed Weights   10/03/18 1709 10/03/18 1842 10/04/18 0409  Weight: 92 kg 89.8 kg 90.7 kg    Labs & Radiologic Studies    CBC Recent Labs    10/03/18 1313 10/04/18 0555  WBC 3.9* 3.6*  HGB 12.1 11.5*  HCT 37.0 35.0*  MCV 95.4 93.6  PLT 309 298   Basic Metabolic Panel Recent Labs    95/63/8705/05/23 1313 10/04/18 0555  NA 141 142  K 3.8 3.9  CL 106 109  CO2 25 23  GLUCOSE 94 99  BUN 21* 15  CREATININE 1.03* 0.94  CALCIUM 9.1 9.0   Liver Function Tests Recent Labs    10/03/18 1257  AST 16  ALT 13  ALKPHOS 65  BILITOT 0.6  PROT 7.7  ALBUMIN 4.4   Recent Labs    10/03/18 1257  LIPASE 46   Cardiac Enzymes No results for input(s): CKTOTAL, CKMB, CKMBINDEX, TROPONINI in the last 72 hours. BNP Invalid input(s): POCBNP D-Dimer No results for input(s): DDIMER in the last 72 hours. Hemoglobin A1C No results for input(s): HGBA1C in the last 72 hours. Fasting Lipid Panel Recent Labs    10/04/18 0555  CHOL 145  HDL 41  LDLCALC 77  TRIG 134  CHOLHDL 3.5   Thyroid Function Tests No results for input(s): TSH, T4TOTAL, T3FREE, THYROIDAB in the last 72 hours.  Invalid  input(s): FREET3 _____________  Dg Chest 2 View  Result Date: 10/03/2018 CLINICAL DATA:  Chest pressure today. EXAM: CHEST - 2 VIEW COMPARISON:  Aug 20, 2017 FINDINGS: The heart size and mediastinal contours are within normal limits. Both lungs are clear. The visualized skeletal structures are unremarkable. IMPRESSION: No active cardiopulmonary disease. Electronically Signed   By: Sherian ReinWei-Chen  Lin M.D.   On: 10/03/2018 12:21   Disposition   Patient was seen and examined by Dr. Rennis GoldenHilty who deemed patient as stable for discharge. Follow-up has been arranged. Discharge medications as listed below.   Follow-up Plans & Appointments    Follow-up Information    Hilty, Lisette AbuKenneth C, MD Follow up.   Specialty: Cardiology Why: Our office will contact you with an appointment date/time for ~1 month post-discharge. Please contact our office if you do not hear from us in 1 week to follow-up.  Contact information: [address] [place] [address] [phone]            Discharge Medications   Allergies as of 10/04/2018  Reactions   Crestor [rosuvastatin]    Myalgia   Lovastatin Other (See Comments)   Myalgias   Niaspan [niacin Er] Itching   Penicillins Other (See Comments)   Child hood allergy Has patient had a PCN reaction causing immediate rash, facial/tongue/throat swelling, SOB or lightheadedness with hypotension: No  Has patient had a PCN reaction causing severe rash involving mucus membranes or skin necrosis: No Has patient had a PCN reaction that required hospitalization: No Has patient had a PCN reaction occurring within the last 10 years: No If all of the above answers are "NO", then may proceed with Cephalosporin use.   Pitavastatin Other (See Comments)   Joints ache   Zocor [simvastatin] Other (See Comments)   Myalgias      Medication List    STOP taking these medications   doxycycline 100 MG tablet Commonly known as: VIBRA-TABS   valACYclovir 1000 MG  tablet Commonly known as: VALTREX     TAKE these medications   acetaminophen 325 MG tablet Commonly known as: TYLENOL Take 650 mg by mouth every 6 (six) hours as needed for mild pain.   aspirin EC 81 MG tablet Take 81 mg by mouth every morning.   buPROPion 300 MG 24 hr tablet Commonly known as: WELLBUTRIN XL Take 300 mg by mouth daily.   clopidogrel 75 MG tablet Commonly known as: PLAVIX Take 1 tablet (75 mg total) by mouth every morning.   Investigational - Study Medication Study name: Clear Additional study details: NO LIPID TESTING   metoprolol tartrate 25 MG tablet Commonly known as: LOPRESSOR TAKE 1/2 TABLET BY MOUTH TWICE A DAY What changed:   how much to take  when to take this   nitroGLYCERIN 0.4 MG SL tablet Commonly known as: NITROSTAT Place 1 tablet (0.4 mg total) under the tongue every 5 (five) minutes as needed for chest pain.   ondansetron 8 MG disintegrating tablet Commonly known as: Zofran ODT Take 1 tablet (8 mg total) by mouth every 8 (eight) hours as needed for nausea or vomiting.   pantoprazole 40 MG tablet Commonly known as: PROTONIX Take 1 tablet (40 mg total) by mouth at bedtime.          Outstanding Labs/Studies   None  Duration of Discharge Encounter   Greater than 30 minutes including physician time.  Signed, Beatriz StallionKrista M. Jamiaya Bina PA-C 10/04/2018, 11:04 AM

## 2018-10-07 ENCOUNTER — Other Ambulatory Visit: Payer: Self-pay | Admitting: *Deleted

## 2018-10-07 ENCOUNTER — Encounter (HOSPITAL_COMMUNITY): Payer: Self-pay | Admitting: Cardiology

## 2018-10-07 NOTE — Patient Outreach (Signed)
[place] [facility]) Care Management  10/07/2018  NATIKA GEYER 07/03/1965 [id number]   Subjective: Telephone call to patient's home  / mobile number, no answer, left HIPAA compliant voicemail message, and requested call back.    Objective: Per KPN (Knowledge Performance Now, point of care tool) and chart review, patient hospitalized 10/03/2018 - 10/04/2018 for chest pain.   Patient also has a history of mixed hyperlipidemia, CAD, and hypertension.      Assessment: Received UMR Transition of care referral on 10/07/2018.  Transition of care follow up pending patient contact.      Plan: RNCM will send unsuccessful outreach letter, [facility] pamphlet, will call patient for 2nd telephone outreach attempt within 4 business days, transition of care follow up, and proceed with case closure, within 10 business days if no return call.      Nayshawn Mesta H. Annia Friendly, BSN, [place] Management [facility] Telephonic CM Phone: [phone] Fax: [phone]

## 2018-10-08 ENCOUNTER — Other Ambulatory Visit: Payer: Self-pay | Admitting: *Deleted

## 2018-10-08 NOTE — Patient Outreach (Signed)
[place] [facility]) Care Management  10/08/2018  Colleen Dillon 05/25/1965 [id number]   Subjective: Telephone call to patient's home  / mobile number, no answer, left HIPAA compliant voicemail message, and requested call back.    Objective: Per KPN (Knowledge Performance Now, point of care tool) and chart review, patient hospitalized 10/03/2018 - 10/04/2018 for chest pain.   Patient also has a history of mixed hyperlipidemia, CAD, and hypertension.      Assessment: Received UMR Transition of care referral on 10/07/2018.  Transition of care follow up pending patient contact.      Plan: RNCM has sent unsuccessful outreach letter, [facility] pamphlet, will call patient for 3rd telephone outreach attempt within 4 business days, transition of care follow up, and proceed with case closure, within 10 business days if no return call.     Estevan Kersh H. Annia Friendly, BSN, [place] Management [facility] Telephonic CM Phone: [phone] Fax: [phone]

## 2018-10-10 ENCOUNTER — Other Ambulatory Visit: Payer: Self-pay | Admitting: *Deleted

## 2018-10-10 ENCOUNTER — Ambulatory Visit: Payer: Self-pay | Admitting: *Deleted

## 2018-10-10 NOTE — Patient Outreach (Signed)
[place] [facility]) Care Management  10/10/2018  Colleen Dillon 09-20-1965 [id number]   Subjective:Telephone call to patient's home / mobile number, no answer, left HIPAA compliant voicemail message, and requested call back.    Objective:Per KPN (Knowledge Performance Now, point of care tool) and chart review,patient hospitalized 10/03/2018 - 10/04/2018 for chest pain. Patient also has a history of mixed hyperlipidemia, CAD, and hypertension.     Assessment: Received UMR Transition of care referral on 10/07/2018.Transition of care follow up pending patient contact.     Plan:RNCM has sent unsuccessful outreach letter, [facility] pamphlet, Assigned RNCM Colleen Dillon) will proceed with case closure, within 10 business days if no return call.     Colleen Dillon, BSN, [place] Management [facility] Telephonic CM Phone: [phone] Fax: [phone]

## 2018-10-14 MED FILL — buPROPion HCL ER (XL) 300 M: 300 | 90 days supply | Qty: 90 | Fill #0

## 2018-10-15 ENCOUNTER — Other Ambulatory Visit: Payer: Self-pay | Admitting: Internal Medicine

## 2018-10-16 MED FILL — METOPROLOL TARTRATE 25 MG T: 25 | 30 days supply | Qty: 30 | Fill #0

## 2018-10-18 ENCOUNTER — Other Ambulatory Visit: Payer: Self-pay | Admitting: *Deleted

## 2018-10-18 NOTE — Patient Outreach (Signed)
[place] [facility]) Care Management  10/18/2018  Colleen Dillon [birth year] [id number]   Transition of Care Case Closure- Unsuccessful Outreach Referral received: 10/07/18 Initial post hospital discharge outreach:  Insurance: [place] Focus Plan  Objective: Per KPN (Knowledge Performance Now, point of care tool) and chart review, patient hospitalized 10/03/2018 - 10/04/2018 for chest pain.   Patient also has a history of mixed hyperlipidemia, CAD, and hypertension.    Assessment: Unable to complete post hospital discharge transition of care assessment; no return call from patient after 3 attempts and no response to request to contact this RNCM in unsuccessful outreach letter mailed to patient's home on  7/6 /20.   Plan: Case closed to [place] Management services as it has been 10 business days since initial post hospital discharge outreach attempt.  Barrington Ellison RN,CCM,CDE [place] Management Coordinator Office Phone [phone] Office Fax [phone]

## 2018-11-26 ENCOUNTER — Other Ambulatory Visit: Payer: Self-pay | Admitting: Internal Medicine

## 2018-11-26 NOTE — Telephone Encounter (Signed)
New Message    *STAT* If patient is at the pharmacy, call can be transferred to refill team.   1. Which medications need to be refilled? (please list name of each medication and dose if known) metoprolol tartrate (LOPRESSOR) 25 MG tablet  2. Which pharmacy/location (including street and city if local pharmacy) is medication to be sent to? [place], [place]  3. Do they need a 30 day or 90 day supply? 90 day     Patient needs enough refills until she is seen by Dr. Debara Pickett on 02/04/2019

## 2018-11-27 MED ORDER — METOPROLOL TARTRATE 25 MG PO TABS: 12.5000 mg | ORAL_TABLET | Freq: Two times a day (BID) | ORAL | 0 refills | Status: DC

## 2018-11-27 MED FILL — METOPROLOL TARTRATE 25 MG T: 25 | 90 days supply | Qty: 90 | Fill #0

## 2018-11-27 NOTE — Telephone Encounter (Signed)
Please review for refill.  

## 2018-11-27 NOTE — Telephone Encounter (Signed)
Not anticoag

## 2018-11-27 NOTE — Telephone Encounter (Signed)
Let patient know that I refilled her Metoprolol.

## 2019-01-16 MED FILL — CLOPIDOGREL 75 MG TABLET: 75 | 90 days supply | Qty: 90 | Fill #2

## 2019-01-24 MED FILL — buPROPion HCL ER (XL) 300 M: 300 | 90 days supply | Qty: 90 | Fill #1

## 2019-02-04 ENCOUNTER — Other Ambulatory Visit: Payer: Self-pay

## 2019-02-04 ENCOUNTER — Ambulatory Visit (INDEPENDENT_AMBULATORY_CARE_PROVIDER_SITE_OTHER): Payer: No Typology Code available for payment source | Admitting: Internal Medicine

## 2019-02-04 ENCOUNTER — Encounter: Payer: Self-pay | Admitting: Internal Medicine

## 2019-02-04 VITALS — BP 120/81 | HR 60 | Ht 62.0 in | Wt 199.4 lb

## 2019-02-04 DIAGNOSIS — E782 Mixed hyperlipidemia: Secondary | ICD-10-CM | POA: Diagnosis not present

## 2019-02-04 DIAGNOSIS — I2510 Atherosclerotic heart disease of native coronary artery without angina pectoris: Secondary | ICD-10-CM | POA: Diagnosis not present

## 2019-02-04 DIAGNOSIS — Z789 Other specified health status: Secondary | ICD-10-CM | POA: Diagnosis not present

## 2019-02-04 MED ORDER — METOPROLOL TARTRATE 25 MG PO TABS: 12.5000 mg | ORAL_TABLET | Freq: Two times a day (BID) | ORAL | 3 refills | Status: DC

## 2019-02-04 NOTE — Progress Notes (Signed)
Date:  02/04/2019   ID:  Roxan DieselKeela M Mckiddy, DOB [birth year], MRN [id number]  PCP:  Blair HeysEhinger, Robert, MD  Primary Cardiologist:  Rennis GoldenHilty  CC: No complaints  History of Present Illness: Lieutenant DiegoKeela M Sauseda is a 53 y.o. female history of obesity, coronary artery disease, hyperlipidemia, hypertension, formerly followed by Dr. Alanda AmassWeintraub - here to establish care with me today. Patient had acute DMI in 2006 with single vessel RCA disease with placement of overlapping bare metal stents. A recatheterization for repeat angina the same day and required subsequent DES stenting of the distal RCA and PLA branches peeling occluded at that time. Last catheterization was in 2010 showed 30-50% narrowing of the acute margin. The overlapping DES stents and IVUS interrogation showed good residual lumen. His chronic PDA occlusion with collaterals from the left. She ruled out for MI in July 2012 last nuclear stress test was 10/17/2010 showed no definite inducible or reversible ischemia the EF of 58%.  Last echo was April 2014 showed ejection fraction of 50-55% with normal wall motion. Is moderate mitral valve regurgitation. Currently works as a Engineer, civil (consulting)nurse at [employer].   She last saw Wilburt FinlayBryan Hager, PA-C for symptoms of fatigue and chest pain this past summer and underwent a nuclear stress test on 10/02/2012 which was negative for ischemia. She exercised to 11.2 metabolic equivalents and had no chest pain. Overall she has done well and recently had a lipid profile which demonstrated LDL cholesterol of 122.  She was previously on Crestor 10 mg daily but was intolerant of this due to side effects including myalgias. She is also previously failed Zocor and lovastatin.  I saw Ms. Twist back in the office today. She is doing well although she reports some morning headaches. She recently had some weight gain which she reports is due to stress eating. She's been under a lot of stress as her husband has some unusual neurologic disorder and  her daughter recently had surgery and is requiring attention and dressing changes. She denies any chest pain or worsening shortness of breath.  [name] returns today for follow-up. She reports a recently she's had some worsening shortness of breath. She's had a walk a longer distance up a hill to get to work and notes that she feels more short of breath. She does exercise about 3 times a week, but generally does not do any significant incline on the treadmill. She has managed to lose little bit of weight recently due to dietary changes as her husband is at goal on a strict diet. She denies any chest pain. Her EKG is essentially unchanged with sinus rhythm and occasional PVCs.  03/02/2016  [name] was seen today in follow-up. She reports no significant change in her shortness of breath. She has recently noticed an increase in her cholesterol. She has been intolerant to statins in the past. She was referred for evaluation of this. Last year her LDL was 105 with a particle number of 1121. Apparently this was higher recently from her primary care provider, however I do not have those results to review. She is requesting recommendations for treatment.  08/15/2016  [name] returns today for follow-up. She recently saw Tereso NewcomerScott Weaver, PA-C for near syncope. She had 2 episodes of rapid heart rate associated with graying out of her vision in one time she fell down to her knees and nearly passed out. 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 Scott recommended echocardiogram which showed normal systolic function and no significant structural heart disease. She was also placed on a monitor which she is currently wearing. I personally reviewed up loads from that monitor indicating several episodes of paroxysmal supraventricular tachycardia. Most of these episodes lasted less than 10 beats but likely represent the episode she had that  caused her to be presyncopal. In the past she's been using low-dose metoprolol as needed but has not taken any recently.  11/20/2016  Mrs. Olesen returns today for follow-up. Overall she seems to be doing well. Heart rate appears well controlled on beta blocker. She feels like her metoprolol is helped with her fast heart rates. She denies any chest pain or worsening shortness of breath. She does have a history of dyslipidemia and given her history of coronary disease should be on treatment for that. Unfortunately she's been intolerant to 4 different statins at different doses as well as niacin in the past. We discussed new medications including the PCSK9 inhibitors which she may be a candidate for. However, it is been several years since she has last had a lipid profile.  08/28/2017  Mrs. Brightbill was seen today in follow-up.  Recently she has been having difficulty with her gallbladder.  She is actually scheduled to have a cholecystectomy.  She is here for preoperative clearance.  Her last year she is done well without any chest pain or worsening shortness of breath.  She has been statin intolerant and we discussed starting PCSK9 inhibitors, but in the interim she enrolled in the CLEAR trial with Dr. Verl Dicker office.  We do not know whether she is on active medication or placebo.  This will continue to enroll into next year.  Denies any recurrent palpitations.  Of note she is on 162 mg dose of aspirin and Plavix.  02/04/2019  Mrs. Eckhart returns today for follow-up.  She recently underwent left heart catheterization in July 2020 for chest pain and was found to have diffuse nonobstructive in-stent restenosis of the distal RCA with a patent PDA and normal LV function.  Medical therapy was recommended.  She continues to be enrolled in the clear outcomes trial through Dr. Verl Dicker office but follows here for cardiac catheter.  She reports no chest pain symptoms at this time.  His were reassessed at the hospital  appear to be well controlled.  This suggest that she may be on the treatment arm.  Wt Readings from Last 3 Encounters:  02/04/19 199 lb 6.4 oz (90.4 kg)  10/04/18 200 lb (90.7 kg)  08/31/17 201 lb (91.2 kg)     Past Medical History:  Diagnosis Date  . Anxiety   . CAD (coronary artery disease)   . Cardiac murmur   . Depression   . Gallstones   . GERD (gastroesophageal reflux disease)   . History of echocardiogram    Echo 5/18: EF 60-65, and normal wall motion, grade 1 diastolic dysfunction  . History of PSVT (paroxysmal supraventricular tachycardia) 2018   monitor   . History of smoking   . Hyperlipidemia   . Obesity   . Old MI (myocardial infarction) 2006  . Pre-syncope 2018    Current Outpatient Medications  Medication Sig Dispense Refill  . acetaminophen (TYLENOL) 325 MG tablet Take 650 mg by mouth every 6 (six) hours as needed for mild pain.    Marland Kitchen aspirin EC 81 MG tablet Take 81 mg by mouth every morning.     Marland Kitchen buPROPion (WELLBUTRIN XL)  300 MG 24 hr tablet Take 300 mg by mouth daily.    . clopidogrel (PLAVIX) 75 MG tablet Take 1 tablet (75 mg total) by mouth every morning. 90 tablet 3  . Investigational - Study Medication Study name: Clear Additional study details: NO LIPID TESTING    . metoprolol tartrate (LOPRESSOR) 25 MG tablet Take 0.5 tablets (12.5 mg total) by mouth 2 (two) times daily. KEEP OV. 90 tablet 0  . nitroGLYCERIN (NITROSTAT) 0.4 MG SL tablet Place 1 tablet (0.4 mg total) under the tongue every 5 (five) minutes as needed for chest pain. 25 tablet 3  . ondansetron (ZOFRAN ODT) 8 MG disintegrating tablet Take 1 tablet (8 mg total) by mouth every 8 (eight) hours as needed for nausea or vomiting. 12 tablet 0  . pantoprazole (PROTONIX) 40 MG tablet Take 1 tablet (40 mg total) by mouth at bedtime. 90 tablet 1   No current facility-administered medications for this visit.     Allergies:    Allergies  Allergen Reactions  . Crestor [Rosuvastatin]     Myalgia    . Lovastatin Other (See Comments)    Myalgias   . Niaspan [Niacin Er] Itching  . Penicillins Other (See Comments)    Child hood allergy Has patient had a PCN reaction causing immediate rash, facial/tongue/throat swelling, SOB or lightheadedness with hypotension: No  Has patient had a PCN reaction causing severe rash involving mucus membranes or skin necrosis: No Has patient had a PCN reaction that required hospitalization: No Has patient had a PCN reaction occurring within the last 10 years: No If all of the above answers are "NO", then may proceed with Cephalosporin use.  . Pitavastatin Other (See Comments)    Joints ache  . Zocor [Simvastatin] Other (See Comments)    Myalgias        Social History:  The patient  reports that she has quit smoking. She has never used smokeless tobacco. She reports that she does not drink alcohol or use drugs.    Family history: No significant cardiac history  ROS:   Pertinent items noted in HPI and remainder of comprehensive ROS otherwise negative.   PHYSICAL EXAM: VS:  BP 120/81   Pulse 60   Ht 5\' 2"  (1.575 m)   Wt 199 lb 6.4 oz (90.4 kg)   SpO2 94%   BMI 36.47 kg/m  General appearance: alert and no distress Neck: no carotid bruit, no JVD and thyroid not enlarged, symmetric, no tenderness/mass/nodules Lungs: clear to auscultation bilaterally Heart: regular rate and rhythm, S1, S2 normal, no murmur, click, rub or gallop Abdomen: soft, non-tender; bowel sounds normal; no masses,  no organomegaly Extremities: extremities normal, atraumatic, no cyanosis or edema Pulses: 2+ and symmetric Skin: Skin color, texture, turgor normal. No rashes or lesions Neurologic: Grossly normal Psych: Pleasant  EKG:  Normal sinus rhythm at 60-personally reviewed  ASSESSMENT: 1. Chest pain-cardiac cath 10/2018 (mild in-stent restenosis, patent PDA, normal LV function) 2. SVT - improved on BB 3. Presyncope -resolved 4. CAD - Acute MI in 2006 with single  vessel RCA disease, status post overlapping BMS 5. Dyslipidemia - statin intolerant 6. Moderate obesity  PLAN: 1.  Mrs. Bardon has had resolution of her chest pain.  Her heart cath showed actually some improvement in her PDA however the right coronary artery stent is somewhat stenosed but not significant.  It did not explain her symptoms.  Her chest pain is resolved.  Her lipids are improved on clinical trial therapy with  bempedoic acid.  Blood pressure is at goal today.  No changes were made to her medications.  Follow-up with me annually or sooner as necessary.  Pixie Casino, MD, [facility], [place] Director of the Advanced Lipid Disorders &  Cardiovascular Risk Reduction Clinic Diplomate of the American Board of Clinical Lipidology Attending Cardiologist  Direct Dial: [phone]  Fax: [phone]  Website:  [email or web address]

## 2019-02-04 NOTE — Patient Instructions (Signed)
Medication Instructions:  Your physician recommends that you continue on your current medications as directed. Please refer to the Current Medication list given to you today.  *If you need a refill on your cardiac medications before your next appointment, please call your pharmacy*   Follow-Up: At [facility], you and your health needs are our priority.  As part of our continuing mission to provide you with exceptional heart care, we have created designated Provider Care Teams.  These Care Teams include your primary Cardiologist (physician) and Advanced Practice Providers (APPs -  Physician Assistants and Nurse Practitioners) who all work together to provide you with the care you need, when you need it.  Your next appointment:   12 month(s)  The format for your next appointment:   In Person  Provider:   K. Chad Hilty, MD  Other Instructions   

## 2019-02-05 ENCOUNTER — Encounter: Payer: Self-pay | Admitting: Internal Medicine

## 2019-03-10 MED FILL — METOPROLOL TARTRATE 25 MG T: 25 | 90 days supply | Qty: 90 | Fill #0

## 2019-04-11 ENCOUNTER — Telehealth: Payer: Self-pay | Admitting: Internal Medicine

## 2019-04-11 NOTE — Telephone Encounter (Signed)
Pt states she has had episodes of lightheadedness over the past few days.   She denies CP, SOB, N/V or fever. Pt has not made any diet or medication changes. She states she has had adequate water intake.   She reports feeling a "flutter" in her chest and then feeling lightheaded and dizzy like she is going to pass out. She can feel it coming and has always been able to sit down or hold onto something so that she has never actually passed out but she is worried about what this means for her.   Most recent vitals are BP 120/86 and HR of 84  Will route to Dr. Rennis Golden for advisement.   Pt aware to call back if symptoms change or worsen.

## 2019-04-11 NOTE — Telephone Encounter (Signed)
Spoke with the patient regarding [name] recommendation for taking an additional 12.5 mg of Metoprolol when having uncontrolled palpitations to control palpitations in hopes it will decreased her lightheadedness. Pt agreeable. I advised the pt to call back the end of next week with an update on how the increased dose is working.

## 2019-04-11 NOTE — Telephone Encounter (Signed)
Patient c/o Palpitations:  High priority if patient c/o lightheadedness, shortness of breath, or chest pain  1) How long have you had palpitations/irregular HR/ Afib? Are you having the symptoms now? Off and on   2) Are you currently experiencing lightheadedness, SOB or CP? lightlessness   3) Do you have a history of afib (atrial fibrillation) or irregular heart rhythm? No  4) Have you checked your BP or HR? (document readings if available): 120/86 HR 84  5) Are you experiencing any other symptoms? Fluttering and has had some black out

## 2019-04-11 NOTE — Telephone Encounter (Signed)
She can take an extra 1/2 tablet (12.5 mg) metoprolol if she has uncontrolled palpitations to slow it down. Please let her know.  Thanks.  Dr Rexene Edison

## 2019-05-07 MED FILL — BUPROPION HCL XL 300 MG TAB: 300 | 30 days supply | Qty: 30 | Fill #2

## 2019-06-03 MED FILL — BuPROPion HCL ER (XL) 300 M: 300 | 30 days supply | Qty: 30 | Fill #3

## 2019-06-03 MED FILL — METOPROLOL TARTRATE 25 MG T: 25 | 90 days supply | Qty: 90 | Fill #1

## 2019-07-01 MED FILL — buPROPion HCL ER (XL) 300 M: 300 | 30 days supply | Qty: 30 | Fill #4

## 2019-08-18 MED FILL — buPROPion HCL ER (XL) 300 M: 300 | 30 days supply | Qty: 30 | Fill #5

## 2019-08-25 ENCOUNTER — Other Ambulatory Visit: Payer: Self-pay | Admitting: Internal Medicine

## 2019-08-26 ENCOUNTER — Other Ambulatory Visit: Payer: Self-pay | Admitting: Internal Medicine

## 2019-08-26 MED FILL — CLOPIDOGREL 75 MG TABLET: 75 | 90 days supply | Qty: 90 | Fill #0

## 2019-09-19 MED FILL — buPROPion HCL ER (XL) 300 M: 300 | 90 days supply | Qty: 90 | Fill #0

## 2019-10-02 DIAGNOSIS — L0889 Other specified local infections of the skin and subcutaneous tissue: Secondary | ICD-10-CM | POA: Diagnosis not present

## 2019-11-18 MED FILL — CLOPIDOGREL 75 MG TABLET: 75 | 90 days supply | Qty: 90 | Fill #1

## 2019-11-18 MED FILL — METOPROLOL TARTRATE 25 MG T: 25 | 90 days supply | Qty: 90 | Fill #3

## 2019-11-20 DIAGNOSIS — H538 Other visual disturbances: Secondary | ICD-10-CM | POA: Diagnosis not present

## 2019-11-20 DIAGNOSIS — H2511 Age-related nuclear cataract, right eye: Secondary | ICD-10-CM | POA: Diagnosis not present

## 2019-11-20 DIAGNOSIS — H524 Presbyopia: Secondary | ICD-10-CM | POA: Diagnosis not present

## 2019-11-20 DIAGNOSIS — H179 Unspecified corneal scar and opacity: Secondary | ICD-10-CM | POA: Diagnosis not present

## 2019-11-23 DIAGNOSIS — Z23 Encounter for immunization: Secondary | ICD-10-CM | POA: Diagnosis not present

## 2020-01-09 ENCOUNTER — Other Ambulatory Visit (HOSPITAL_COMMUNITY): Payer: Self-pay | Admitting: Family Medicine

## 2020-01-09 MED FILL — buPROPion HCL ER (XL) 300 M: 300 | 30 days supply | Qty: 30 | Fill #0

## 2020-02-05 ENCOUNTER — Ambulatory Visit: Payer: 59 | Admitting: Internal Medicine

## 2020-02-05 ENCOUNTER — Encounter: Payer: Self-pay | Admitting: Internal Medicine

## 2020-02-05 ENCOUNTER — Other Ambulatory Visit: Payer: Self-pay

## 2020-02-05 VITALS — BP 118/82 | HR 61 | Ht 62.0 in | Wt 204.2 lb

## 2020-02-05 DIAGNOSIS — I2510 Atherosclerotic heart disease of native coronary artery without angina pectoris: Secondary | ICD-10-CM | POA: Diagnosis not present

## 2020-02-05 DIAGNOSIS — Z789 Other specified health status: Secondary | ICD-10-CM | POA: Diagnosis not present

## 2020-02-05 DIAGNOSIS — E782 Mixed hyperlipidemia: Secondary | ICD-10-CM

## 2020-02-05 NOTE — Patient Instructions (Signed)
Medication Instructions:  No changes *If you need a refill on your cardiac medications before your next appointment, please call your pharmacy*   Lab Work: None ordered If you have labs (blood work) drawn today and your tests are completely normal, you will receive your results only by: . MyChart Message (if you have MyChart) OR . A paper copy in the mail If you have any lab test that is abnormal or we need to change your treatment, we will call you to review the results.   Testing/Procedures: None ordered   Follow-Up: At [facility], you and your health needs are our priority.  As part of our continuing mission to provide you with exceptional heart care, we have created designated Provider Care Teams.  These Care Teams include your primary Cardiologist (physician) and Advanced Practice Providers (APPs -  Physician Assistants and Nurse Practitioners) who all work together to provide you with the care you need, when you need it.  We recommend signing up for the patient portal called "MyChart".  Sign up information is provided on this After Visit Summary.  MyChart is used to connect with patients for Virtual Visits (Telemedicine).  Patients are able to view lab/test results, encounter notes, upcoming appointments, etc.  Non-urgent messages can be sent to your provider as well.   To learn more about what you can do with MyChart, go to [email or web address].    Your next appointment:   12 month(s)  The format for your next appointment:   In Person  Provider:   K. Chad Hilty, MD   Other Instructions None  

## 2020-02-05 NOTE — Progress Notes (Signed)
Date:  02/05/2020   ID:  Tatiana, Courter Nov 06, 1965, MRN [id number]  PCP:  Blair Heys, MD  Primary Cardiologist:  Rennis Golden  CC: No complaints  History of Present Illness: Colleen Dillon is a 54 y.o. female history of obesity, coronary artery disease, hyperlipidemia, hypertension, formerly followed by Dr. Alanda Amass - here to establish care with me today. Patient had acute DMI in 2006 with single vessel RCA disease with placement of overlapping bare metal stents. A recatheterization for repeat angina the same day and required subsequent DES stenting of the distal RCA and PLA branches peeling occluded at that time. Last catheterization was in 2010 showed 30-50% narrowing of the acute margin. The overlapping DES stents and IVUS interrogation showed good residual lumen. His chronic PDA occlusion with collaterals from the left. She ruled out for MI in July 2012 last nuclear stress test was 10/17/2010 showed no definite inducible or reversible ischemia the EF of 58%.  Last echo was April 2014 showed ejection fraction of 50-55% with normal wall motion. Is moderate mitral valve regurgitation. Currently works as a Engineer, civil (consulting) at [employer].   She last saw Wilburt Finlay, PA-C for symptoms of fatigue and chest pain this past summer and underwent a nuclear stress test on 10/02/2012 which was negative for ischemia. She exercised to 11.2 metabolic equivalents and had no chest pain. Overall she has done well and recently had a lipid profile which demonstrated LDL cholesterol of 122.  She was previously on Crestor 10 mg daily but was intolerant of this due to side effects including myalgias. She is also previously failed Zocor and lovastatin.  I saw Ms. Scriven back in the office today. She is doing well although she reports some morning headaches. She recently had some weight gain which she reports is due to stress eating. She's been under a lot of stress as her husband has some unusual neurologic disorder and  her daughter recently had surgery and is requiring attention and dressing changes. She denies any chest pain or worsening shortness of breath.  Mrs. Docter returns today for follow-up. She reports a recently she's had some worsening shortness of breath. She's had a walk a longer distance up a hill to get to work and notes that she feels more short of breath. She does exercise about 3 times a week, but generally does not do any significant incline on the treadmill. She has managed to lose little bit of weight recently due to dietary changes as her husband is at goal on a strict diet. She denies any chest pain. Her EKG is essentially unchanged with sinus rhythm and occasional PVCs.  03/02/2016  Mrs. Brame was seen today in follow-up. She reports no significant change in her shortness of breath. She has recently noticed an increase in her cholesterol. She has been intolerant to statins in the past. She was referred for evaluation of this. Last year her LDL was 105 with a particle number of 1121. Apparently this was higher recently from her primary care provider, however I do not have those results to review. She is requesting recommendations for treatment.  08/15/2016  Mrs. Hinostroza returns today for follow-up. She recently saw Tereso Newcomer, PA-C for near syncope. She had 2 episodes of rapid heart rate associated with graying out of her vision in one time she fell down to her knees and nearly passed out. 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 Scott recommended echocardiogram which showed normal systolic function and no significant structural heart disease. She was also placed on a monitor which she is currently wearing. I personally reviewed up loads from that monitor indicating several episodes of paroxysmal supraventricular tachycardia. Most of these episodes lasted less than 10 beats but likely represent the episode she had that  caused her to be presyncopal. In the past she's been using low-dose metoprolol as needed but has not taken any recently.  11/20/2016  Mrs. Deanda returns today for follow-up. Overall she seems to be doing well. Heart rate appears well controlled on beta blocker. She feels like her metoprolol is helped with her fast heart rates. She denies any chest pain or worsening shortness of breath. She does have a history of dyslipidemia and given her history of coronary disease should be on treatment for that. Unfortunately she's been intolerant to 4 different statins at different doses as well as niacin in the past. We discussed new medications including the PCSK9 inhibitors which she may be a candidate for. However, it is been several years since she has last had a lipid profile.  08/28/2017  Mrs. Chuong was seen today in follow-up.  Recently she has been having difficulty with her gallbladder.  She is actually scheduled to have a cholecystectomy.  She is here for preoperative clearance.  Her last year she is done well without any chest pain or worsening shortness of breath.  She has been statin intolerant and we discussed starting PCSK9 inhibitors, but in the interim she enrolled in the CLEAR trial with Dr. Verl Dicker office.  We do not know whether she is on active medication or placebo.  This will continue to enroll into next year.  Denies any recurrent palpitations.  Of note she is on 162 mg dose of aspirin and Plavix.  02/04/2019  Mrs. Arena returns today for follow-up.  She recently underwent left heart catheterization in July 2020 for chest pain and was found to have diffuse nonobstructive in-stent restenosis of the distal RCA with a patent PDA and normal LV function.  Medical therapy was recommended.  She continues to be enrolled in the clear outcomes trial through Dr. Verl Dicker office but follows here for cardiac catheter.  She reports no chest pain symptoms at this time.  His were reassessed at the hospital  appear to be well controlled.  This suggest that she may be on the treatment arm.  02/05/2020  Mrs. Bucher is seen today in follow-up.  Overall she continues to do well.  She denies any recurrent chest pain.  She continues in the clear outcomes trial with bempedoic acid.  Her lipids have been blinded.  Blood pressure is excellent today 118/82.  She is on aspirin and Plavix dual antiplatelet therapy.  Since she had diffuse nonobstructive in-stent restenosis of the distal RCA by cath in 2020, medical therapy with dual antiplatelet therapy long-term was recommended.  EKG shows normal sinus rhythm at 61.  Wt Readings from Last 3 Encounters:  02/05/20 204 lb 3.2 oz (92.6 kg)  02/04/19 199 lb 6.4 oz (90.4 kg)  10/04/18 200 lb (90.7 kg)     Past Medical History:  Diagnosis Date  . Anxiety   . CAD (coronary artery disease)   . Cardiac murmur   . Depression   . Gallstones   . GERD (gastroesophageal reflux disease)   . History of echocardiogram    Echo 5/18: EF 60-65, and normal wall motion, grade 1 diastolic dysfunction  .  History of PSVT (paroxysmal supraventricular tachycardia) 2018   monitor   . History of smoking   . Hyperlipidemia   . Obesity   . Old MI (myocardial infarction) 2006  . Pre-syncope 2018    Current Outpatient Medications  Medication Sig Dispense Refill  . acetaminophen (TYLENOL) 325 MG tablet Take 650 mg by mouth every 6 (six) hours as needed for mild pain.    Marland Kitchen aspirin EC 81 MG tablet Take 81 mg by mouth every morning.     Marland Kitchen buPROPion (WELLBUTRIN XL) 300 MG 24 hr tablet Take 300 mg by mouth daily.    . clopidogrel (PLAVIX) 75 MG tablet TAKE 1 TABLET BY MOUTH EVERY MORNING 90 tablet 3  . Investigational - Study Medication Study name: Clear Additional study details: NO LIPID TESTING    . metoprolol tartrate (LOPRESSOR) 25 MG tablet Take 0.5 tablets (12.5 mg total) by mouth 2 (two) times daily. 90 tablet 3  . nitroGLYCERIN (NITROSTAT) 0.4 MG SL tablet Place 1 tablet  (0.4 mg total) under the tongue every 5 (five) minutes as needed for chest pain. 25 tablet 3  . ondansetron (ZOFRAN ODT) 8 MG disintegrating tablet Take 1 tablet (8 mg total) by mouth every 8 (eight) hours as needed for nausea or vomiting. 12 tablet 0  . pantoprazole (PROTONIX) 40 MG tablet Take 1 tablet (40 mg total) by mouth at bedtime. 90 tablet 1  . valACYclovir (VALTREX) 1000 MG tablet As needed     No current facility-administered medications for this visit.    Allergies:    Allergies  Allergen Reactions  . Crestor [Rosuvastatin]     Myalgia   . Lovastatin Other (See Comments)    Myalgias   . Niaspan [Niacin Er] Itching  . Penicillins Other (See Comments)    Child hood allergy Has patient had a PCN reaction causing immediate rash, facial/tongue/throat swelling, SOB or lightheadedness with hypotension: No  Has patient had a PCN reaction causing severe rash involving mucus membranes or skin necrosis: No Has patient had a PCN reaction that required hospitalization: No Has patient had a PCN reaction occurring within the last 10 years: No If all of the above answers are "NO", then may proceed with Cephalosporin use.  . Pitavastatin Other (See Comments)    Joints ache  . Zocor [Simvastatin] Other (See Comments)    Myalgias        Social History:  The patient  reports that she has quit smoking. She has never used smokeless tobacco. She reports that she does not drink alcohol and does not use drugs.    Family history: No significant cardiac history  ROS:   Pertinent items noted in HPI and remainder of comprehensive ROS otherwise negative.   PHYSICAL EXAM: VS:  BP 118/82   Pulse 61   Ht 5\' 2"  (1.575 m)   Wt 204 lb 3.2 oz (92.6 kg)   SpO2 96%   BMI 37.35 kg/m  General appearance: alert and no distress Neck: no carotid bruit, no JVD and thyroid not enlarged, symmetric, no tenderness/mass/nodules Lungs: clear to auscultation bilaterally Heart: regular rate and rhythm,  S1, S2 normal, no murmur, click, rub or gallop Abdomen: soft, non-tender; bowel sounds normal; no masses,  no organomegaly Extremities: extremities normal, atraumatic, no cyanosis or edema Pulses: 2+ and symmetric Skin: Skin color, texture, turgor normal. No rashes or lesions Neurologic: Grossly normal Psych: Pleasant  EKG:  Normal sinus rhythm at 61-personally reviewed  ASSESSMENT: 1. Chest pain-cardiac cath 10/2018 (mild  in-stent restenosis, patent PDA, normal LV function) no intervention, recommended long-term DAPT 2. SVT - improved on BB 3. Presyncope -resolved 4. CAD - Acute MI in 2006 with single vessel RCA disease, status post overlapping BMS 5. Dyslipidemia - statin intolerant, enrolled in clear outcomes trial 6. Moderate obesity  PLAN: 1.  [name] denies any recurrent chest pain or worsening shortness of breath.  She seems to be tolerating DAPT.  She denies any arrhythmias or palpitations.  She has a dyslipidemia and is enrolled in a clinical trial with bempedoic acid.  Blood pressure is well controlled.  She needs to continue to work on activity and weight loss.  No changes in her medicines today.  Plan follow-up with me annually or sooner as necessary.  Chrystie NoseKenneth C. Tranisha Tissue, MD, [facility]oscopy CenterFACC, FACP  [place]  [facility]  Medical Director of the [facility]  [facility] Diplomate of the American Board of Clinical Lipidology Attending Cardiologist  Direct Dial: [phone]  Fax: [phone]  Website:  [email or web address]

## 2020-02-06 ENCOUNTER — Encounter: Payer: Self-pay | Admitting: Internal Medicine

## 2020-02-16 MED FILL — buPROPion HCL ER (XL) 300 M: 300 | 30 days supply | Qty: 30 | Fill #1

## 2020-02-16 MED FILL — CLOPIDOGREL 75 MG TABLET: 75 | 90 days supply | Qty: 90 | Fill #2

## 2020-02-20 ENCOUNTER — Other Ambulatory Visit: Payer: Self-pay

## 2020-02-20 ENCOUNTER — Other Ambulatory Visit (HOSPITAL_COMMUNITY): Payer: Self-pay | Admitting: Family Medicine

## 2020-02-20 ENCOUNTER — Ambulatory Visit (HOSPITAL_COMMUNITY)
Admission: EM | Admit: 2020-02-20 | Discharge: 2020-02-20 | Disposition: A | Discharge status: Home / Self Care | Payer: 59 | Attending: Family Medicine | Admitting: Family Medicine

## 2020-02-20 ENCOUNTER — Encounter (HOSPITAL_COMMUNITY): Payer: Self-pay | Admitting: Emergency Medicine

## 2020-02-20 DIAGNOSIS — M549 Dorsalgia, unspecified: Secondary | ICD-10-CM | POA: Diagnosis not present

## 2020-02-20 DIAGNOSIS — M546 Pain in thoracic spine: Secondary | ICD-10-CM | POA: Insufficient documentation

## 2020-02-20 LAB — COMPREHENSIVE METABOLIC PANEL
ALT: 12 U/L (ref 0–44)
AST: 18 U/L (ref 15–41)
Albumin: 4 g/dL (ref 3.5–5.0)
Alkaline Phosphatase: 67 U/L (ref 38–126)
Anion gap: 10 (ref 5–15)
BUN: 11 mg/dL (ref 6–20)
CO2: 25 mmol/L (ref 22–32)
Calcium: 9.4 mg/dL (ref 8.9–10.3)
Chloride: 106 mmol/L (ref 98–111)
Creatinine, Ser: 0.98 mg/dL (ref 0.44–1.00)
GFR, Estimated: >60 mL/min (ref >60)
Glucose, Bld: 91 mg/dL (ref 70–99)
Potassium: 3.9 mmol/L (ref 3.5–5.1)
Sodium: 141 mmol/L (ref 135–145)
Total Bilirubin: 0.5 mg/dL (ref 0.3–1.2)
Total Protein: 7 g/dL (ref 6.5–8.1)

## 2020-02-20 LAB — POCT URINALYSIS DIPSTICK, ED / UC
Bilirubin Urine: NEGATIVE
Glucose, UA: NEGATIVE mg/dL
Hgb urine dipstick: NEGATIVE
Ketones, ur: NEGATIVE mg/dL
Nitrite: NEGATIVE
Protein, ur: NEGATIVE mg/dL
Specific Gravity, Urine: 1.02 (ref 1.005–1.030)
Urobilinogen, UA: 0.2 mg/dL (ref 0.0–1.0)
pH: 6 (ref 5.0–8.0)

## 2020-02-20 LAB — AMYLASE: Amylase: 25 U/L — ABNORMAL LOW (ref 28–100)

## 2020-02-20 LAB — CBC WITH DIFFERENTIAL/PLATELET
Abs Immature Granulocytes: 0.01 10*3/uL (ref 0.00–0.07)
Basophils Absolute: 0 10*3/uL (ref 0.0–0.1)
Basophils Relative: 1 %
Eosinophils Absolute: 0.1 10*3/uL (ref 0.0–0.5)
Eosinophils Relative: 3 %
HCT: 35.3 % — ABNORMAL LOW (ref 36.0–46.0)
Hemoglobin: 11.8 g/dL — ABNORMAL LOW (ref 12.0–15.0)
Immature Granulocytes: 0 %
Lymphocytes Relative: 30 %
Lymphs Abs: 1.4 10*3/uL (ref 0.7–4.0)
MCH: 31.5 pg (ref 26.0–34.0)
MCHC: 33.4 g/dL (ref 30.0–36.0)
MCV: 94.1 fL (ref 80.0–100.0)
Monocytes Absolute: 0.4 10*3/uL (ref 0.1–1.0)
Monocytes Relative: 8 %
Neutro Abs: 2.8 10*3/uL (ref 1.7–7.7)
Neutrophils Relative %: 58 %
Platelets: 380 10*3/uL (ref 150–400)
RBC: 3.75 MIL/uL — ABNORMAL LOW (ref 3.87–5.11)
RDW: 12.2 % (ref 11.5–15.5)
WBC: 4.7 10*3/uL (ref 4.0–10.5)
nRBC: 0 % (ref 0.0–0.2)

## 2020-02-20 LAB — LIPASE, BLOOD: Lipase: 49 U/L (ref 11–51)

## 2020-02-20 MED ORDER — ONDANSETRON 4 MG PO TBDP: 4.0000 mg | ORAL_TABLET | Freq: Three times a day (TID) | ORAL | 0 refills | Status: DC | PRN

## 2020-02-20 MED ORDER — HYDROCODONE-ACETAMINOPHEN 5-325 MG PO TABS: 1.0000 | ORAL_TABLET | Freq: Four times a day (QID) | ORAL | 0 refills | Status: DC | PRN

## 2020-02-20 MED FILL — ONDANSETRON ODT 4 MG TABLET: 4 | 4 days supply | Qty: 10 | Fill #0

## 2020-02-20 MED FILL — HYDROCODON-APAP 5-325: 5-325 | 1 days supply | Qty: 6 | Fill #0

## 2020-02-20 NOTE — Discharge Instructions (Addendum)
Urine with small leuks.  We will send for culture. Should have results tomorrow  Checking some lab work.  Should have in a few hours.  We will call you with any concerning results.   Pain and nausea medicine as needed.  For any continued or worsening symptoms please go straight to the ER.

## 2020-02-20 NOTE — ED Triage Notes (Signed)
Pt c/o sharp sudden upper back to mid back pain x 3 days. She states she feels nauseated when the pain happens. The pain kept her up last night. She has been taking tylenol for the pain.

## 2020-02-21 LAB — URINE CULTURE

## 2020-02-23 NOTE — ED Provider Notes (Signed)
[facility]    CSN: [id number] Arrival date & time: 02/20/20  1436      History   Chief Complaint Chief Complaint  Patient presents with  . Back Pain    HPI Colleen Dillon is a 54 y.o. female.   Patient is a 54 year old female past medical history of anxiety, CAD, depression, gallstones, GERD, hyperlipidemia, MI.  She presents with with mid upper back pain radiation to epigastric area.  She has had some associated nausea.  This is been pretty constant waxing and waning.  She is taken Tylenol for the pain without much relief.  Denies any vomiting or diarrhea.  Denies any chest pain or shortness of breath.  Does have a history of kidney stones.  Feels like this is similar to previous kidney stones.  No specific urinary symptoms.  No fevers, chills.     Past Medical History:  Diagnosis Date  . Anxiety   . CAD (coronary artery disease)   . Cardiac murmur   . Depression   . Gallstones   . GERD (gastroesophageal reflux disease)   . History of echocardiogram    Echo 5/18: EF 60-65, and normal wall motion, grade 1 diastolic dysfunction  . History of PSVT (paroxysmal supraventricular tachycardia) 2018   monitor   . History of smoking   . Hyperlipidemia   . Obesity   . Old MI (myocardial infarction) 2006  . Pre-syncope 2018    Patient Active Problem List   Diagnosis Date Noted  . Unstable angina (HCC) 10/03/2018  . Preoperative cardiovascular examination 08/28/2017  . Statin intolerance 08/28/2017  . SVT (supraventricular tachycardia) (HCC) 08/15/2016  . Near syncope 08/15/2016  . DOE (dyspnea on exertion) 06/30/2015  . Chest pain of uncertain etiology 09/27/2012  . Obesity (BMI 35.0-39.9 without comorbidity) 09/27/2012  . Coronary artery disease involving native coronary artery of native heart without angina pectoris 09/27/2012  . Mixed hyperlipidemia 09/27/2012  . Fatigue 09/27/2012    Past Surgical History:  Procedure Laterality Date  . ABDOMINAL  HYSTERECTOMY  2002  . CARDIAC CATHETERIZATION  05/18/2004   90% RCA stenosis, stented w/ a [employer] Liberte 5.0x14mm bare metal stent - postdilated at 10atmx10sec, stented w/ a second [employer] Liberte 4.5x62mm bare metal stent at 15atmx30sec, resulting in TIMI III flow.  Marland Kitchen CARDIAC CATHETERIZATION  09/08/2004   No intervention - continue medical therapy  . CARDIAC CATHETERIZATION  03/21/2005   Distal RCA 50-60% in-stent restenosis, stented w/ a DES Scimed Taxus Express II 3.5x64mm stent - postdilated at 20atmx31sec, resulting in TIMI III flow-60% stenosis reduced to 0-10%  . CARDIAC CATHETERIZATION  05/23/2005   No intervention - continue medical therapy  . CARDIAC CATHETERIZATION  11/02/2008   No intervention - continue medical therapy  . CARDIAC SURGERY  2006   stent placement  . CARDIOVASCULAR STRESS TEST  10/02/2012   No significant ST segment change suggestive of ischemia  . CESAREAN SECTION  1987  . CHOLECYSTECTOMY N/A 08/31/2017   Procedure: LAPAROSCOPIC CHOLECYSTECTOMY WITH INTRAOPERATIVE CHOLANGIOGRAM;  Surgeon: Axel Filler, MD;  Location[facility] OR;  Service: General;  Laterality: N/A;  . COLONOSCOPY    . LEFT HEART CATH AND CORONARY ANGIOGRAPHY N/A 10/03/2018   Procedure: LEFT HEART CATH AND CORONARY ANGIOGRAPHY;  Surgeon: [place]azil[place], Peter M, MD;  Location: [facility] INVASIVE CV LAB;  Service: Cardiovascular;  Laterality: N/A;  . TEAR DUCT PROBING Left 05/02/2017   Procedure: NASAL LACRIMAL DUCT EXPLORATION WITH LACRICATH BALLOON CANALICULOPLASTY LEFT EYE;  Surgeon: Aura Camps,  MD;  Location: [facility];  Service: Ophthalmology;  Laterality: Left;  . TRANSTHORACIC ECHOCARDIOGRAM  07/30/2012   EF 50-55%, moderate mitral regurg  . TUBAL LIGATION      OB History   No obstetric history on file.      Home Medications    Prior to Admission medications   Medication Sig Start Date End Date Taking? Authorizing Provider  aspirin EC 81 MG tablet Take 81 mg by  mouth every morning.    Yes [provider]  buPROPion (WELLBUTRIN XL) 300 MG 24 hr tablet Take 300 mg by mouth daily.   Yes [provider]  clopidogrel (PLAVIX) 75 MG tablet TAKE 1 TABLET BY MOUTH EVERY MORNING 08/26/19  Yes Hilty, Lisette Abu, MD  Investigational - Study Medication Study name: Clear Additional study details: NO LIPID TESTING   Yes [provider]  metoprolol tartrate (LOPRESSOR) 25 MG tablet Take 0.5 tablets (12.5 mg total) by mouth 2 (two) times daily. 02/04/19  Yes Hilty, Lisette Abu, MD  nitroGLYCERIN (NITROSTAT) 0.4 MG SL tablet Place 1 tablet (0.4 mg total) under the tongue every 5 (five) minutes as needed for chest pain. 08/15/16  Yes Hilty, Lisette Abu, MD  pantoprazole (PROTONIX) 40 MG tablet Take 1 tablet (40 mg total) by mouth at bedtime. 05/12/14  Yes Hilty, Lisette Abu, MD  valACYclovir (VALTREX) 1000 MG tablet As needed   Yes [provider]  HYDROcodone-acetaminophen (NORCO/VICODIN) 5-325 MG tablet Take 1-2 tablets by mouth every 6 (six) hours as needed. 02/20/20   Jenalyn Girdner, Gloris Manchester A, NP  ondansetron (ZOFRAN ODT) 4 MG disintegrating tablet Take 1 tablet (4 mg total) by mouth every 8 (eight) hours as needed for nausea or vomiting. 02/20/20   Janace Aris, NP    Family History Family History  Problem Relation Age of Onset  . Ulcers Father 40       Peptic ulcers  . Cancer Father 35  . Hypertension Brother 35  . Heart disease Maternal Grandfather 55  . Hypertension Brother 34  . Aneurysm Mother 60       Dissected  . Hypertension Mother 21  . Arthritis Mother 4       Rheumatoid arthritis    Social History Social History   Tobacco Use  . Smoking status: Former Games developer  . Smokeless tobacco: Never Used  . Tobacco comment: quit smoking cigarettes > 35 years ago  Vaping Use  . Vaping Use: Never used  Substance Use Topics  . Alcohol use: No  . Drug use: No     Allergies   Crestor [rosuvastatin], Lovastatin, Niaspan [niacin er],  Penicillins, Pitavastatin, and Zocor [simvastatin]   Review of Systems Review of Systems   Physical Exam Triage Vital Signs ED Triage Vitals  Enc Vitals Group     BP 02/20/20 1514 131/87     Pulse Rate 02/20/20 1514 65     Resp 02/20/20 1514 17     Temp 02/20/20 1514 98 F (36.7 C)     Temp Source 02/20/20 1514 Oral     SpO2 02/20/20 1514 98 %     Weight --      Height --      Head Circumference --      Peak Flow --      Pain Score 02/20/20 1512 4     Pain Loc --      Pain Edu? --      Excl. in GC? --    No  data found.  Updated Vital Signs BP 131/87 (BP Location: Right Arm)   Pulse 65   Temp 98 F (36.7 C) (Oral)   Resp 17   SpO2 98%   Visual Acuity Right Eye Distance:   Left Eye Distance:   Bilateral Distance:    Right Eye Near:   Left Eye Near:    Bilateral Near:     Physical Exam Vitals and nursing note reviewed.  Constitutional:      General: She is not in acute distress.    Appearance: Normal appearance. She is not ill-appearing, toxic-appearing or diaphoretic.  HENT:     Head: Normocephalic.     Nose: Nose normal.  Eyes:     Conjunctiva/sclera: Conjunctivae normal.  Cardiovascular:     Rate and Rhythm: Normal rate and regular rhythm.  Pulmonary:     Effort: Pulmonary effort is normal.     Breath sounds: Normal breath sounds.  Abdominal:     Palpations: Abdomen is soft.     Tenderness: There is abdominal tenderness in the epigastric area.    Musculoskeletal:        General: Normal range of motion.     Cervical back: Normal range of motion.       Back:     Comments: Where pt describes her pain, no palpable pain.   Skin:    General: Skin is warm and dry.     Findings: No rash.  Neurological:     Mental Status: She is alert.  Psychiatric:        Mood and Affect: Mood normal.      UC Treatments / Results  Labs (all labs ordered are listed, but only abnormal results are displayed) Labs Reviewed  URINE CULTURE - Abnormal; Notable  for the following components:      Result Value   Culture MULTIPLE SPECIES PRESENT, SUGGEST RECOLLECTION (*)    All other components within normal limits  CBC WITH DIFFERENTIAL/PLATELET - Abnormal; Notable for the following components:   RBC 3.75 (*)    Hemoglobin 11.8 (*)    HCT 35.3 (*)    All other components within normal limits  AMYLASE - Abnormal; Notable for the following components:   Amylase 25 (*)    All other components within normal limits  POCT URINALYSIS DIPSTICK, ED / UC - Abnormal; Notable for the following components:   Leukocytes,Ua SMALL (*)    All other components within normal limits  COMPREHENSIVE METABOLIC PANEL  LIPASE, BLOOD    EKG   Radiology No results found.  Procedures Procedures (including critical care time)  Medications Ordered in UC Medications - No data to display  Initial Impression / Assessment and Plan / UC Course  I have reviewed the triage vital signs and the nursing notes.  Pertinent labs & imaging results that were available during my care of the patient were reviewed by me and considered in my medical decision making (see chart for details).     Acute back pain epigastric pain No specific CVA tenderness.  Patient UA with small leuks.  Will send for culture. Doubt kidney stone at this time.  Patient has had a cholecystectomy. Symptoms may be related to GERD. She is already taking Protonix daily  Drawing some lab work Prescribed pain and nausea medicine to use as needed Recommended for any continued or worsening symptoms to go to the ER.  EKG also nonconcerning with normal sinus rhythm and normal rate.  No concern for ACS at this  time. Final Clinical Impressions(s) / UC Diagnoses   Final diagnoses:  Acute bilateral thoracic back pain     Discharge Instructions     Urine with small leuks.  We will send for culture. Should have results tomorrow  Checking some lab work.  Should have in a few hours.  We will call you with any  concerning results.   Pain and nausea medicine as needed.  For any continued or worsening symptoms please go straight to the ER.    ED Prescriptions    Medication Sig Dispense Auth. Provider   HYDROcodone-acetaminophen (NORCO/VICODIN) 5-325 MG tablet Take 1-2 tablets by mouth every 6 (six) hours as needed. 6 tablet Raymond Azure A, NP   ondansetron (ZOFRAN ODT) 4 MG disintegrating tablet Take 1 tablet (4 mg total) by mouth every 8 (eight) hours as needed for nausea or vomiting. 10 tablet Kahlin Mark A, NP     I have reviewed the PDMP during this encounter.   Janace Aris, NP 02/23/20 [id number]

## 2020-03-25 MED FILL — buPROPion HCL ER (XL) 300 M: 300 | 30 days supply | Qty: 30 | Fill #0

## 2020-04-26 MED FILL — buPROPion HCL ER (XL) 300 M: 300 | 30 days supply | Qty: 30 | Fill #1

## 2020-05-06 ENCOUNTER — Other Ambulatory Visit: Payer: Self-pay | Admitting: Internal Medicine

## 2020-05-06 MED FILL — METOPROLOL TARTRATE 25 MG T: 25 | 90 days supply | Qty: 90 | Fill #0

## 2020-06-21 ENCOUNTER — Other Ambulatory Visit (HOSPITAL_BASED_OUTPATIENT_CLINIC_OR_DEPARTMENT_OTHER): Payer: Self-pay

## 2020-07-01 ENCOUNTER — Other Ambulatory Visit (HOSPITAL_COMMUNITY): Payer: Self-pay | Admitting: Family Medicine

## 2020-07-01 MED FILL — buPROPion HCL ER (XL) 300 M: 300 | 30 days supply | Qty: 30 | Fill #0

## 2020-07-08 ENCOUNTER — Other Ambulatory Visit: Payer: Self-pay | Admitting: Family Medicine

## 2020-07-08 ENCOUNTER — Other Ambulatory Visit (HOSPITAL_COMMUNITY): Payer: Self-pay

## 2020-07-08 DIAGNOSIS — Z1231 Encounter for screening mammogram for malignant neoplasm of breast: Secondary | ICD-10-CM

## 2020-07-08 DIAGNOSIS — Z23 Encounter for immunization: Secondary | ICD-10-CM | POA: Diagnosis not present

## 2020-07-08 DIAGNOSIS — R454 Irritability and anger: Secondary | ICD-10-CM | POA: Diagnosis not present

## 2020-07-08 DIAGNOSIS — Z0000 Encounter for general adult medical examination without abnormal findings: Secondary | ICD-10-CM | POA: Diagnosis not present

## 2020-07-08 DIAGNOSIS — I2510 Atherosclerotic heart disease of native coronary artery without angina pectoris: Secondary | ICD-10-CM | POA: Diagnosis not present

## 2020-07-08 DIAGNOSIS — E669 Obesity, unspecified: Secondary | ICD-10-CM | POA: Diagnosis not present

## 2020-07-08 MED ORDER — BUPROPION HCL ER (XL) 300 MG PO TB24
300.0000 mg | ORAL_TABLET | Freq: Every morning | ORAL | 3 refills | Status: DC
  Filled 2020-07-08 – 2020-08-23 (×2): qty 90, 90d supply, fill #0
  Filled 2021-01-30: qty 90, 90d supply, fill #1
  Filled 2021-04-02: qty 90, 90d supply, fill #2
  Filled 2021-06-11: qty 90, 90d supply, fill #3

## 2020-07-09 ENCOUNTER — Other Ambulatory Visit (HOSPITAL_COMMUNITY): Payer: Self-pay

## 2020-08-23 ENCOUNTER — Other Ambulatory Visit (HOSPITAL_COMMUNITY): Payer: Self-pay

## 2020-08-23 MED FILL — Metoprolol Tartrate Tab 25 MG: ORAL | 90 days supply | Qty: 90 | Fill #0 | Status: AC

## 2020-08-31 ENCOUNTER — Other Ambulatory Visit: Payer: Self-pay

## 2020-08-31 ENCOUNTER — Ambulatory Visit
Admission: RE | Admit: 2020-08-31 | Discharge: 2020-08-31 | Disposition: A | Discharge status: Home / Self Care | Payer: 59 | Source: Ambulatory Visit | Attending: Family Medicine | Admitting: Family Medicine

## 2020-08-31 DIAGNOSIS — Z1231 Encounter for screening mammogram for malignant neoplasm of breast: Secondary | ICD-10-CM

## 2020-10-06 ENCOUNTER — Encounter: Payer: Self-pay | Admitting: Cardiology

## 2020-10-06 ENCOUNTER — Other Ambulatory Visit: Payer: Self-pay

## 2020-10-06 DIAGNOSIS — Z006 Encounter for examination for normal comparison and control in clinical research program: Secondary | ICD-10-CM

## 2020-10-06 NOTE — Progress Notes (Signed)
Research visit A Randomized, Double-blind, Placebo-controlled Study to Assess the Effects of Bempedoic Acid (ETC-1002) on the Occurrence of Major Cardiovascular Events in Patients With, or at High Risk for, Cardiovascular Disease Who Are Statin Intolerant

## 2020-10-07 DIAGNOSIS — Z23 Encounter for immunization: Secondary | ICD-10-CM | POA: Diagnosis not present

## 2020-10-14 DIAGNOSIS — E782 Mixed hyperlipidemia: Secondary | ICD-10-CM

## 2020-10-14 NOTE — Telephone Encounter (Signed)
We need to get an updated lipid profile on her and she should let us know if she received study drug or not and how it was tolerated during the trial.  She will also need a follow-up appt with me.  Thanks,  Dr Rexene Edison

## 2020-11-12 ENCOUNTER — Other Ambulatory Visit (HOSPITAL_COMMUNITY): Payer: Self-pay

## 2020-11-12 DIAGNOSIS — E782 Mixed hyperlipidemia: Secondary | ICD-10-CM | POA: Diagnosis not present

## 2020-11-12 LAB — LIPID PANEL
Chol/HDL Ratio: 3.1 ratio (ref 0.0–4.4)
Cholesterol, Total: 167 mg/dL (ref 100–199)
HDL: 54 mg/dL (ref >39)
LDL Chol Calc (NIH): 98 mg/dL (ref 0–99)
Triglycerides: 79 mg/dL (ref 0–149)
VLDL Cholesterol Cal: 15 mg/dL (ref 5–40)

## 2020-11-12 MED ORDER — CARESTART COVID-19 HOME TEST VI KIT
PACK | 0 refills | Status: DC
  Filled 2020-11-12: qty 4, 4d supply, fill #0

## 2020-11-15 ENCOUNTER — Ambulatory Visit: Payer: 59 | Admitting: Internal Medicine

## 2020-11-15 ENCOUNTER — Other Ambulatory Visit (HOSPITAL_COMMUNITY): Payer: Self-pay

## 2020-11-15 ENCOUNTER — Other Ambulatory Visit: Payer: Self-pay

## 2020-11-15 ENCOUNTER — Encounter: Payer: Self-pay | Admitting: Internal Medicine

## 2020-11-15 VITALS — BP 122/86 | HR 70 | Ht 62.0 in | Wt 205.2 lb

## 2020-11-15 DIAGNOSIS — E782 Mixed hyperlipidemia: Secondary | ICD-10-CM | POA: Diagnosis not present

## 2020-11-15 DIAGNOSIS — Z789 Other specified health status: Secondary | ICD-10-CM | POA: Diagnosis not present

## 2020-11-15 DIAGNOSIS — G2581 Restless legs syndrome: Secondary | ICD-10-CM

## 2020-11-15 DIAGNOSIS — I2510 Atherosclerotic heart disease of native coronary artery without angina pectoris: Secondary | ICD-10-CM

## 2020-11-15 MED ORDER — EZETIMIBE 10 MG PO TABS
10.0000 mg | ORAL_TABLET | Freq: Every day | ORAL | 3 refills | Status: DC
  Filled 2020-11-15: qty 90, 90d supply, fill #0

## 2020-11-15 NOTE — Patient Instructions (Signed)
Medication Instructions:  START zetia 10mg  daily Continue other current medications  *If you need a refill on your cardiac medications before your next appointment, please call your pharmacy*   Lab Work: FASTING lab work a few days before your next visit with , PA  If you have labs (blood work) drawn today and your tests are completely normal, you will receive your results only by: MyChart Message (if you have MyChart) OR A paper copy in the mail If you have any lab test that is abnormal or we need to change your treatment, we will call you to review the results.   Testing/Procedures: NONE   Follow-Up: At [facility], you and your health needs are our priority.  As part of our continuing mission to provide you with exceptional heart care, we have created designated Provider Care Teams.  These Care Teams include your primary Cardiologist (physician) and Advanced Practice Providers (APPs -  Physician Assistants and Nurse Practitioners) who all work together to provide you with the care you need, when you need it.  We recommend signing up for the patient portal called "MyChart".  Sign up information is provided on this After Visit Summary.  MyChart is used to connect with patients for Virtual Visits (Telemedicine).  Patients are able to view lab/test results, encounter notes, upcoming appointments, etc.  Non-urgent messages can be sent to your provider as well.   To learn more about what you can do with MyChart, go to [facility].    Your next appointment:   01/31/2021 with 02/02/2021 PA

## 2020-11-15 NOTE — Progress Notes (Signed)
Date:  11/15/2020   ID:  [name], DOB Oct 06, 1965, MRN [id number]  PCP:  [name], MD  Primary Cardiologist:  Debara Pickett  CC: No complaints  History of Present Illness: Colleen Dillon is a 55 y.o. female history of obesity, coronary artery disease, hyperlipidemia, hypertension, formerly followed by Dr. Rollene Fare - here to establish care with me today. Patient had acute DMI in 2006 with single vessel RCA disease with placement of overlapping bare metal stents. A recatheterization for repeat angina the same day and required subsequent DES stenting of the distal RCA and PLA branches peeling occluded at that time. Last catheterization was in 2010 showed 30-50% narrowing of the acute margin. The overlapping DES stents and IVUS interrogation showed good residual lumen. His chronic PDA occlusion with collaterals from the left. She ruled out for MI in July 2012 last nuclear stress test was 10/17/2010 showed no definite inducible or reversible ischemia the EF of 58%.  Last echo was April 2014 showed ejection fraction of 50-55% with normal wall motion. Is moderate mitral valve regurgitation. Currently works as a Marine scientist at [employer].   She last saw Tarri Fuller, PA-C for symptoms of fatigue and chest pain this past summer and underwent a nuclear stress test on 10/02/2012 which was negative for ischemia. She exercised to 40.9 metabolic equivalents and had no chest pain. Overall she has done well and recently had a lipid profile which demonstrated LDL cholesterol of 122.  She was previously on Crestor 10 mg daily but was intolerant of this due to side effects including myalgias. She is also previously failed Zocor and lovastatin.  I saw Colleen Dillon back in the office today. She is doing well although she reports some morning headaches. She recently had some weight gain which she reports is due to stress eating. She's been under a lot of stress as her husband has some unusual neurologic disorder and  her daughter recently had surgery and is requiring attention and dressing changes. She denies any chest pain or worsening shortness of breath.  Colleen Dillon returns today for follow-up. She reports a recently she's had some worsening shortness of breath. She's had a walk a longer distance up a hill to get to work and notes that she feels more short of breath. She does exercise about 3 times a week, but generally does not do any significant incline on the treadmill. She has managed to lose little bit of weight recently due to dietary changes as her husband is at goal on a strict diet. She denies any chest pain. Her EKG is essentially unchanged with sinus rhythm and occasional PVCs.  03/02/2016  Colleen Dillon was seen today in follow-up. She reports no significant change in her shortness of breath. She has recently noticed an increase in her cholesterol. She has been intolerant to statins in the past. She was referred for evaluation of this. Last year her LDL was 105 with a particle number of 1121. Apparently this was higher recently from her primary care provider, however I do not have those results to review. She is requesting recommendations for treatment.  08/15/2016  Colleen Dillon returns today for follow-up. She recently saw Richardson Dopp, PA-C for near syncope. She had 2 episodes of rapid heart rate associated with graying out of her vision in one time she fell down to her knees and nearly passed out. 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 Scott recommended echocardiogram which showed normal systolic function and no significant structural heart disease. She was also placed on a monitor which she is currently wearing. I personally reviewed up loads from that monitor indicating several episodes of paroxysmal supraventricular tachycardia. Most of these episodes lasted less than 10 beats but likely represent the episode she had that  caused her to be presyncopal. In the past she's been using low-dose metoprolol as needed but has not taken any recently.  11/20/2016  Colleen Dillon returns today for follow-up. Overall she seems to be doing well. Heart rate appears well controlled on beta blocker. She feels like her metoprolol is helped with her fast heart rates. She denies any chest pain or worsening shortness of breath. She does have a history of dyslipidemia and given her history of coronary disease should be on treatment for that. Unfortunately she's been intolerant to 4 different statins at different doses as well as niacin in the past. We discussed new medications including the PCSK9 inhibitors which she may be a candidate for. However, it is been several years since she has last had a lipid profile.  08/28/2017  Colleen Dillon was seen today in follow-up.  Recently she has been having difficulty with her gallbladder.  She is actually scheduled to have a cholecystectomy.  She is here for preoperative clearance.  Her last year she is done well without any chest pain or worsening shortness of breath.  She has been statin intolerant and we discussed starting PCSK9 inhibitors, but in the interim she enrolled in the CLEAR trial with Dr. Irven Shelling office.  We do not know whether she is on active medication or placebo.  This will continue to enroll into next year.  Denies any recurrent palpitations.  Of note she is on 162 mg dose of aspirin and Plavix.  02/04/2019  Colleen Dillon returns today for follow-up.  She recently underwent left heart catheterization in July 2020 for chest pain and was found to have diffuse nonobstructive in-stent restenosis of the distal RCA with a patent PDA and normal LV function.  Medical therapy was recommended.  She continues to be enrolled in the clear outcomes trial through Dr. Irven Shelling office but follows here for cardiac catheter.  She reports no chest pain symptoms at this time.  His were reassessed at the hospital  appear to be well controlled.  This suggest that she may be on the treatment arm.  02/05/2020  Colleen Dillon is seen today in follow-up.  Overall she continues to do well.  She denies any recurrent chest pain.  She continues in the clear outcomes trial with bempedoic acid.  Her lipids have been blinded.  Blood pressure is excellent today 118/82.  She is on aspirin and Plavix dual antiplatelet therapy.  Since she had diffuse nonobstructive in-stent restenosis of the distal RCA by cath in 2020, medical therapy with dual antiplatelet therapy long-term was recommended.  EKG shows normal sinus rhythm at 61.  11/15/2020  Colleen Dillon returns today for follow-up.  She has come out of the clear trial.  I suspect she may have been on placebo.  Her lipids are decent however not at target LDL less than 70.  Total cholesterol now 167, triglycerides 79, HDL 54 and LDL 98.  Has been reporting some restlessness/leg cramping and in her legs at night.  She notes some intermittent edema as well.  Wt Readings from Last 3 Encounters:  11/15/20 205 lb 3.2 oz (93.1 kg)  02/05/20 204  lb 3.2 oz (92.6 kg)  02/04/19 199 lb 6.4 oz (90.4 kg)     Past Medical History:  Diagnosis Date   Anxiety    CAD (coronary artery disease)    Cardiac murmur    Depression    Gallstones    GERD (gastroesophageal reflux disease)    History of echocardiogram    Echo 5/18: EF 60-65, and normal wall motion, grade 1 diastolic dysfunction   History of PSVT (paroxysmal supraventricular tachycardia) 2018   monitor    History of smoking    Hyperlipidemia    Obesity    Old MI (myocardial infarction) 2006   Pre-syncope 2018    Current Outpatient Medications  Medication Sig Dispense Refill   aspirin EC 81 MG tablet Take 81 mg by mouth every morning.      buPROPion (WELLBUTRIN XL) 300 MG 24 hr tablet Take 1 tablet (300 mg total) by mouth every morning. 90 tablet 3   clopidogrel (PLAVIX) 75 MG tablet      COVID-19 At Home Antigen Test  (CARESTART COVID-19 HOME TEST) KIT Use as directed within package instructions 4 each 0   metoprolol tartrate (LOPRESSOR) 25 MG tablet TAKE 1/2 TABLET BY MOUTH TWICE DAILY 90 tablet 3   nitroGLYCERIN (NITROSTAT) 0.4 MG SL tablet Place 1 tablet (0.4 mg total) under the tongue every 5 (five) minutes as needed for chest pain. 25 tablet 3   valACYclovir (VALTREX) 1000 MG tablet As needed     No current facility-administered medications for this visit.    Allergies:    Allergies  Allergen Reactions   Crestor [Rosuvastatin]     Myalgia    Lovastatin Other (See Comments)    Myalgias    Niaspan [Niacin Er] Itching   Penicillins Other (See Comments)    Child hood allergy Has patient had a PCN reaction causing immediate rash, facial/tongue/throat swelling, SOB or lightheadedness with hypotension: No  Has patient had a PCN reaction causing severe rash involving mucus membranes or skin necrosis: No Has patient had a PCN reaction that required hospitalization: No Has patient had a PCN reaction occurring within the last 10 years: No If all of the above answers are "NO", then may proceed with Cephalosporin use.   Pitavastatin Other (See Comments)    Joints ache   Zocor [Simvastatin] Other (See Comments)    Myalgias        Social History:  The patient  reports that she has quit smoking. She has never used smokeless tobacco. She reports that she does not drink alcohol and does not use drugs.    Family history: No significant cardiac history  ROS:   Pertinent items noted in HPI and remainder of comprehensive ROS otherwise negative.   PHYSICAL EXAM: VS:  BP 122/86   Pulse 70   Ht _0  (1.575 m)   Wt 205 lb 3.2 oz (93.1 kg)   SpO2 95%   BMI 37.53 kg/m  General appearance: alert and no distress Neck: no carotid bruit, no JVD and thyroid not enlarged, symmetric, no tenderness/mass/nodules Lungs: clear to auscultation bilaterally Heart: regular rate and rhythm, S1, S2 normal, no murmur,  click, rub or gallop Abdomen: soft, non-tender; bowel sounds normal; no masses,  no organomegaly Extremities: extremities normal, atraumatic, no cyanosis or edema Pulses: 2+ and symmetric Skin: Skin color, texture, turgor normal. No rashes or lesions Neurologic: Grossly normal Psych: Pleasant  EKG:  Normal sinus rhythm at 70-personally reviewed  ASSESSMENT: Chest pain-cardiac cath 10/2018 (mild in-stent  restenosis, patent PDA, normal LV function) no intervention, recommended long-term DAPT SVT - improved on BB Presyncope -resolved CAD - Acute MI in 2006 with single vessel RCA disease, status post overlapping BMS Dyslipidemia - statin intolerant, enrolled in clear outcomes trial Moderate obesity  PLAN: 1.  Colleen Dillon does not report any anginal symptoms.  Her cholesterol still higher than target.  I suspect she was on placebo in the clear trial.  Now that she is unenrolled we have options for additional therapy.  She has not previously tried ezetimibe or we could consider bempedoic acid which was in the study.  Based on cost alone I think the ezetimibe would be a good option and will start 10 mg daily.  Repeat lipids in 2 months and plan follow-up afterwards at her scheduled appoint with Almyra Deforest, PA-C on [place].  Pixie Casino, MD, [facility], [place] Director of the Advanced Lipid Disorders &  Cardiovascular Risk Reduction Clinic Diplomate of the American Board of Clinical Lipidology Attending Cardiologist  Direct Dial: [phone]  Fax: [phone]  Website:  [email or web address]

## 2020-12-10 ENCOUNTER — Telehealth: Payer: 59 | Admitting: Nurse Practitioner

## 2020-12-10 ENCOUNTER — Other Ambulatory Visit (HOSPITAL_COMMUNITY): Payer: Self-pay

## 2020-12-10 DIAGNOSIS — R6884 Jaw pain: Secondary | ICD-10-CM

## 2020-12-10 MED ORDER — PREDNISONE 20 MG PO TABS
40.0000 mg | ORAL_TABLET | Freq: Every day | ORAL | 0 refills | Status: AC
  Filled 2020-12-10: qty 10, 5d supply, fill #0

## 2020-12-10 NOTE — Progress Notes (Signed)
Virtual Visit Consent   Colleen Dillon, you are scheduled for a virtual visit with Colleen Dillon, [place], a [facility] provider, today.     Just as with appointments in the office, your consent must be obtained to participate.  Your consent will be active for this visit and any virtual visit you may have with one of our providers in the next 365 days.     If you have a MyChart account, a copy of this consent can be sent to you electronically.  All virtual visits are billed to your insurance company just like a traditional visit in the office.    As this is a virtual visit, video technology does not allow for your provider to perform a traditional examination.  This may limit your provider's ability to fully assess your condition.  If your provider identifies any concerns that need to be evaluated in person or the need to arrange testing (such as labs, EKG, etc.), we will make arrangements to do so.     Although advances in technology are sophisticated, we cannot ensure that it will always work on either your end or our end.  If the connection with a video visit is poor, the visit may have to be switched to a telephone visit.  With either a video or telephone visit, we are not always able to ensure that we have a secure connection.     I need to obtain your verbal consent now.   Are you willing to proceed with your visit today? YES   Colleen Dillon has provided verbal consent on 12/10/2020 for a virtual visit (video or telephone).   Colleen Hassell Done, FNP   Date: 12/10/2020 11:04 AM   Virtual Visit via Video Note   I, Colleen Dillon, connected with Colleen Dillon ([id number], 1966-01-17) on 12/10/20 at 11:30 AM EDT by a video-enabled telemedicine application and verified that I am speaking with the correct person using two identifiers.  Location: Patient: Virtual Visit Location Patient: Other: work Provider: Ecologist: Mobile   I discussed the  limitations of evaluation and management by telemedicine and the availability of in person appointments. The patient expressed understanding and agreed to proceed.    History of Present Illness: Colleen Dillon is a 55 y.o. who identifies as a female who was assigned female at birth, and is being seen today for jaw pain.  HPI: Patient states she has been having right jaw pain since Sunday. She went to see her dentist and he said he is not tooth related. She spoke with oral surgeon at work and they think it is inflammed TMJ. She has been taking motrin with no relief.   Review of Systems  Constitutional:  Negative for chills, fever and malaise/fatigue.  HENT:  Negative for congestion, sinus pain and sore throat.   Respiratory:  Positive for cough.   Neurological:  Negative for dizziness and headaches.   Problems:  Patient Active Problem List   Diagnosis Date Noted   Unstable angina ([place]) 10/03/2018   Preoperative cardiovascular examination 08/28/2017   Statin intolerance 08/28/2017   SVT (supraventricular tachycardia) ([place]) 08/15/2016   Near syncope 08/15/2016   DOE (dyspnea on exertion) 06/30/2015   Chest pain of uncertain etiology 14/43/1540   Obesity (BMI 35.0-39.9 without comorbidity) 09/27/2012   Coronary artery disease involving native coronary artery of native heart without angina pectoris 09/27/2012   Mixed hyperlipidemia 09/27/2012   Fatigue 09/27/2012    Allergies:  Allergies  Allergen Reactions   Crestor [Rosuvastatin]     Myalgia    Lovastatin Other (See Comments)    Myalgias    Niaspan [Niacin Er] Itching   Penicillins Other (See Comments)    Child hood allergy Has patient had a PCN reaction causing immediate rash, facial/tongue/throat swelling, SOB or lightheadedness with hypotension: No  Has patient had a PCN reaction causing severe rash involving mucus membranes or skin necrosis: No Has patient had a PCN reaction that required hospitalization: No Has patient  had a PCN reaction occurring within the last 10 years: No If all of the above answers are "NO", then may proceed with Cephalosporin use.   Pitavastatin Other (See Comments)    Joints ache   Zocor [Simvastatin] Other (See Comments)    Myalgias    Medications:  Current Outpatient Medications:    aspirin EC 81 MG tablet, Take 81 mg by mouth every morning. , Disp: , Rfl:    buPROPion (WELLBUTRIN XL) 300 MG 24 hr tablet, Take 1 tablet (300 mg total) by mouth every morning., Disp: 90 tablet, Rfl: 3   clopidogrel (PLAVIX) 75 MG tablet, , Disp: , Rfl:    COVID-19 At Home Antigen Test (CARESTART COVID-19 HOME TEST) KIT, Use as directed within package instructions, Disp: 4 each, Rfl: 0   ezetimibe (ZETIA) 10 MG tablet, Take 1 tablet (10 mg total) by mouth daily., Disp: 90 tablet, Rfl: 3   metoprolol tartrate (LOPRESSOR) 25 MG tablet, TAKE 1/2 TABLET BY MOUTH TWICE DAILY, Disp: 90 tablet, Rfl: 3   nitroGLYCERIN (NITROSTAT) 0.4 MG SL tablet, Place 1 tablet (0.4 mg total) under the tongue every 5 (five) minutes as needed for chest pain., Disp: 25 tablet, Rfl: 3   valACYclovir (VALTREX) 1000 MG tablet, As needed, Disp: , Rfl:   Observations/Objective: Patient is well-developed, well-nourished in no acute distress.  Resting comfortably  at home.  Head is normocephalic, atraumatic.  No labored breathing.  Speech is clear and coherent with logical content.  Patient is alert and oriented at baseline.  Pain on opening mouth fully  Assessment and Plan:  Colleen Dillon in today with chief complaint of Jaw Pain   1. Jaw pain Rest jaw Ice Avoid foods that requiring a lot of chewing No gum If not improving with meds need to se oral surgeon or ENT. Meds ordered this encounter  Medications   predniSONE (DELTASONE) 20 MG tablet    Sig: Take 2 tablets (40 mg total) by mouth daily with breakfast for 5 days. 2 po daily for 5 days    Dispense:  10 tablet    Refill:  0    Order Specific Question:    Supervising Provider    Answer:   Noemi Chapel [[id number]]        Follow Up Instructions: I discussed the assessment and treatment plan with the patient. The patient was provided an opportunity to ask questions and all were answered. The patient agreed with the plan and demonstrated an understanding of the instructions.  A copy of instructions were sent to the patient via MyChart.  The patient was advised to call back or seek an in-person evaluation if the symptoms worsen or if the condition fails to improve as anticipated.  Time:  I spent 1 0 with the patient via telehealth technology discussing the above problems/concerns.    Colleen Hassell Done, FNP

## 2021-01-17 DIAGNOSIS — D649 Anemia, unspecified: Secondary | ICD-10-CM | POA: Diagnosis not present

## 2021-01-17 DIAGNOSIS — G500 Trigeminal neuralgia: Secondary | ICD-10-CM | POA: Diagnosis not present

## 2021-01-18 ENCOUNTER — Encounter: Payer: Self-pay | Admitting: Neurology

## 2021-01-20 ENCOUNTER — Other Ambulatory Visit (HOSPITAL_COMMUNITY): Payer: Self-pay | Admitting: Family Medicine

## 2021-01-20 ENCOUNTER — Other Ambulatory Visit: Payer: Self-pay | Admitting: Family Medicine

## 2021-01-20 DIAGNOSIS — G500 Trigeminal neuralgia: Secondary | ICD-10-CM

## 2021-01-29 ENCOUNTER — Ambulatory Visit (HOSPITAL_COMMUNITY): Admission: RE | Admit: 2021-01-29 | Payer: 59 | Source: Ambulatory Visit

## 2021-01-29 ENCOUNTER — Ambulatory Visit (HOSPITAL_COMMUNITY): Payer: 59

## 2021-01-30 ENCOUNTER — Ambulatory Visit (HOSPITAL_COMMUNITY)
Admission: RE | Admit: 2021-01-30 | Discharge: 2021-01-30 | Disposition: A | Discharge status: Home / Self Care | Payer: 59 | Source: Ambulatory Visit | Attending: Family Medicine | Admitting: Family Medicine

## 2021-01-30 DIAGNOSIS — G500 Trigeminal neuralgia: Secondary | ICD-10-CM

## 2021-01-30 DIAGNOSIS — I671 Cerebral aneurysm, nonruptured: Secondary | ICD-10-CM | POA: Diagnosis not present

## 2021-01-30 MED ORDER — GADOBUTROL 1 MMOL/ML IV SOLN
10.0000 mL | Freq: Once | INTRAVENOUS | Status: AC | PRN
  Administered 2021-01-30: 10 mL via INTRAVENOUS

## 2021-01-30 MED FILL — Metoprolol Tartrate Tab 25 MG: ORAL | 90 days supply | Qty: 90 | Fill #1 | Status: AC

## 2021-01-31 ENCOUNTER — Ambulatory Visit: Payer: 59 | Admitting: Physician Assistant

## 2021-01-31 ENCOUNTER — Other Ambulatory Visit (HOSPITAL_COMMUNITY): Payer: Self-pay

## 2021-02-04 ENCOUNTER — Other Ambulatory Visit (HOSPITAL_COMMUNITY): Payer: Self-pay

## 2021-02-04 ENCOUNTER — Other Ambulatory Visit: Payer: Self-pay | Admitting: Internal Medicine

## 2021-02-04 MED ORDER — CLOPIDOGREL BISULFATE 75 MG PO TABS
ORAL_TABLET | Freq: Every morning | ORAL | 3 refills | Status: DC
  Filled 2021-02-04: qty 90, 90d supply, fill #0
  Filled 2021-04-02: qty 90, 90d supply, fill #1
  Filled 2021-06-11 – 2021-07-26 (×2): qty 90, 90d supply, fill #2
  Filled 2021-11-14: qty 90, 90d supply, fill #3

## 2021-02-07 DIAGNOSIS — R519 Headache, unspecified: Secondary | ICD-10-CM | POA: Diagnosis not present

## 2021-02-08 ENCOUNTER — Other Ambulatory Visit (HOSPITAL_COMMUNITY): Payer: Self-pay

## 2021-02-08 MED ORDER — GABAPENTIN 300 MG PO CAPS
ORAL_CAPSULE | ORAL | 0 refills | Status: DC
  Filled 2021-02-08: qty 30, 10d supply, fill #0

## 2021-02-08 NOTE — Progress Notes (Signed)
Cardiology Office Note:    Date:  02/18/2021   ID:  Colleen Dillon, DOB 20-Feb-1966, MRN [id number]  PCP:  Colleen Arabian, MD  Cardiologist:  Colleen Casino, MD  Electrophysiologist:  None   Referring MD: Colleen Arabian, MD   Chief Complaint: follow-up of hyperlipidemia   History of Present Illness:    Colleen Dillon is a 55 y.o. female with a history of CAD s/p PCI with overlapping BMS of the RCA and subsequent DES to distal RCA and PLA branches in 2006, hypertension, hyperlipidemia, GERD, prior tobacco use, and obesity who is followed by Colleen Dillon and presents today for follow-up of hyperlipidemia.  Patient has a long history of CAD.  She had a acute MI in 2006 and was found to have single-vessel RCA disease.  She initially underwent PCI with placement of overlapping BMS to the RCA.  She then had a relook cath that same day due to her recurrent angina and required subsequent DES stenting of the distal RCA and PL branches.  Patient has had multiple stress test since then no evidence of ischemia. In 2017 showed no evidence of significant ventilatory or circulatory limitation.  Her body habitus was thought to be the main reason for her exercise intolerance. Patient was seen in 07/2016 for further evaluation of near syncope. Echo showed LVEF of 60 to 65% with normal wall motion and grade 1 diastolic dysfunction.  Monitor showed underlying sinus rhythm with short runs of paroxysmal SVT.  She was started on beta-blocker.  Patient was admitted in 10/2018 with unstable angina.  Cardiac catheterization at that time showed obstructive CAD with diffuse non-obstructive in-stent restenosis of the distal RCA and patent stent to the PDA.  Continued medical therapy was recommended.  She was enrolled in the CLEAR trial during this admission given statin intolerance.  Patient was last seen by Colleen Dillon in 11/2020.  She had completed the CLEAR trial and it was suspected she was probably on the placebo.  Is  overall doing well from a cardiac standpoint.  He was started on Zetia with plans for repeat lipid panel in 2 months and then a follow-up visit.  Patient presents today for follow-up. Here alone. Doing well since last visit. Unfortunately, she was unable to tolerate Zetia due to myalgias. Repeat lipid panel earlier this week showed total cholesterol 213, triglycerides 116, HDL 49, and LDL 143. She is otherwise doing well from a cardiac standpoint and denies any chest pain, shortness of breath, orthopnea, or PND. She has mild lower extremity edema but this is stable. No palpitations, lightheadedness, dizziness, or syncope.  Since last visit, patient did start having right jaw pain that would radiate up the side of her face. She initially thought she had a cavity or something. She was seen by the dentist and oral surgeon but work-up was normal. PCP ordered brain MRI which showed a 67m cavernous left ICA aneurysm but was otherwise unremarkable. However, she was felt to have inflammation of trigeminal nerve and was started on Gabapentin. She does not improvement with this but states she can only take it at night because it makes her very drowsy.   Past Medical History:  Diagnosis Date   Anxiety    CAD (coronary artery disease)    Cardiac murmur    Depression    Gallstones    GERD (gastroesophageal reflux disease)    History of echocardiogram    Echo 5/18: EF 60-65, and normal wall motion, grade 1 diastolic dysfunction  History of PSVT (paroxysmal supraventricular tachycardia) 2018   monitor    History of smoking    Hyperlipidemia    Obesity    Old MI (myocardial infarction) 2006   Pre-syncope 2018    Past Surgical History:  Procedure Laterality Date   ABDOMINAL HYSTERECTOMY  2002   CARDIAC CATHETERIZATION  05/18/2004   90% RCA stenosis, stented w/ a Boston Scientific Liberte 5.0x66m bare metal stent - postdilated at 10atmx10sec, stented w/ a second [place]4.5x162mbare  metal stent at 15atmx30sec, resulting in TIMI III flow.   CARDIAC CATHETERIZATION  09/08/2004   No intervention - continue medical therapy   CARDIAC CATHETERIZATION  03/21/2005   Distal RCA 50-60% in-stent restenosis, stented w/ a DES Scimed Taxus Express II 3.5x3227mtent - postdilated at 20atmx31sec, resulting in TIMI III flow-60% stenosis reduced to 0-10%   CARDIAC CATHETERIZATION  05/23/2005   No intervention - continue medical therapy   CARDIAC CATHETERIZATION  11/02/2008   No intervention - continue medical therapy   CARDIAC SURGERY  2006   stent placement   CARDIOVASCULAR STRESS TEST  10/02/2012   No significant ST segment change suggestive of ischemia   CESAREAN SECTION  1987   CHOLECYSTECTOMY N/A 08/31/2017   Procedure: LAPAROSCOPIC CHOLECYSTECTOMY WITH INTRAOPERATIVE CHOLANGIOGRAM;  Surgeon: RamRalene OkD;  [facility];  Laterality: N/A;   COLONOSCOPY     LEFT HEART CATH AND CORONARY ANGIOGRAPHY N/A 10/03/2018   Procedure: LEFT HEART CATH AND CORONARY ANGIOGRAPHY;  Surgeon: JorMartiniqueeter M, MD;  Location: [facility];  Service: Cardiovascular;  Laterality: N/A;   TEAR DUCT PROBING Left 05/02/2017   Procedure: NASAL LACRIMAL DUCT EXPLORATION WITH LACRICATH BALLOON CANALICULOPLASTY LEFT EYE;  Surgeon: SpeGevena CottonD;  [facility];  Laterality: Left;   TRANSTHORACIC ECHOCARDIOGRAM  07/30/2012   EF 50-55%, moderate mitral regurg   TUBAL LIGATION      Current Medications: Current Meds  Medication Sig   aspirin EC 81 MG tablet Take 81 mg by mouth every morning.    buPROPion (WELLBUTRIN XL) 300 MG 24 hr tablet Take 1 tablet (300 mg total) by mouth every morning.   clopidogrel (PLAVIX) 75 MG tablet TAKE 1 TABLET BY MOUTH EVERY MORNING   COVID-19 At Home Antigen Test (CARESTART COVID-19 HOME TEST) KIT Use as directed within package instructions   gabapentin (NEURONTIN) 300 MG capsule Take 1 capsule by  mouth 3 times a day as needed for pain for 10 days.   metoprolol tartrate (LOPRESSOR) 25 MG tablet TAKE 1/2 TABLET BY MOUTH TWICE DAILY   nitroGLYCERIN (NITROSTAT) 0.4 MG SL tablet Place 1 tablet (0.4 mg total) under the tongue every 5 (five) minutes as needed for chest pain.   valACYclovir (VALTREX) 1000 MG tablet As needed     Allergies:   Crestor [rosuvastatin], Lovastatin, Niaspan [niacin er], Penicillins, Pitavastatin, and Zocor [simvastatin]   Social History   Socioeconomic History   Marital status: Married    Spouse name: Not on file   Number of children: Not on file   Years of education: Not on file   Highest education level: Not on file  Occupational History   Not on file  Tobacco Use   Smoking status: Former   Smokeless tobacco: Never   Tobacco comments:    quit smoking cigarettes > 35 years ago  Vaping Use   Vaping Use: Never used  Substance and Sexual Activity   Alcohol use:  No   Drug use: No   Sexual activity: Not on file  Other Topics Concern   Not on file  Social History Narrative   Not on file   Social Determinants of Health   Financial Resource Strain: Not on file  Food Insecurity: Not on file  Transportation Needs: Not on file  Physical Activity: Not on file  Stress: Not on file  Social Connections: Not on file     Family History: The patient's family history includes Aneurysm (age of onset: 66) in her mother; Arthritis (age of onset: 37) in her mother; Cancer (age of onset: 53) in her father; Heart disease (age of onset: 19) in her maternal grandfather; Hypertension (age of onset: 89) in her brother; Hypertension (age of onset: 18) in her brother; Hypertension (age of onset: 46) in her mother; Ulcers (age of onset: 15) in her father.  ROS:   Please see the history of present illness.     EKGs/Labs/Other Studies Reviewed:    The following studies were reviewed today:  CPX 07/14/2015: No evidence of significant ventilatory or circulatory  limitation.  Suspect her body habitus is the main reason for her exercise  intolerance.  _______________  Event Monitor 07/29/2016 to 08/27/2016: Sinus rhythm with short runs of PSVT. _______________  Echocardiogram 08/08/2016: Study Conclusions: - Left ventricle: The cavity size was normal. Systolic function was    normal. The estimated ejection fraction was in the range of 60%    to 65%. Wall motion was normal; there were no regional wall    motion abnormalities. Doppler parameters are consistent with    abnormal left ventricular relaxation (grade 1 diastolic    dysfunction). Doppler parameters are consistent with    indeterminate ventricular filling pressure.  - Aortic valve: Transvalvular velocity was within the normal range.    There was no stenosis. There was no regurgitation.  - Mitral valve: Transvalvular velocity was within the normal range.    There was no evidence for stenosis. There was trivial    regurgitation.  - Right ventricle: The cavity size was normal. Wall thickness was    normal. Systolic function was normal.  - Tricuspid valve: There was no regurgitation.  - Pulmonary arteries: Systolic pressure was within the normal    range.  _______________  Cardiac Catheterization 10/03/2018: Mid RCA to Dist RCA lesion is 30% stenosed. Dist RCA lesion is 45% stenosed. Previously placed RPAV stent (unknown type) is widely patent. The left ventricular systolic function is normal. LV end diastolic pressure is normal. The left ventricular ejection fraction is 55-65% by visual estimate.   1. Nonobstructive CAD involving the distal RCA with diffuse nonobstructive in stent restenosis. The PDA is now patent 2. Normal LV function 3. Mildly elevated LVEDP   Plan: continue medical therapy. Anticipate DC in am.  Diagnostic Dominance: Right    EKG:  EKG not ordered today.   Recent Labs: 02/20/2020: ALT 12; BUN 11; Creatinine, Ser 0.98; Hemoglobin 11.8; Platelets 380;  Potassium 3.9; Sodium 141  Recent Lipid Panel    Component Value Date/Time   CHOL 213 (H) 02/16/2021 0815   CHOL 178 06/18/2014 0000   TRIG 116 02/16/2021 0815   TRIG 85 06/18/2014 0000   HDL 49 02/16/2021 0815   HDL 56 06/18/2014 0000   CHOLHDL 4.3 02/16/2021 0815   CHOLHDL 3.5 10/04/2018 0555   VLDL 27 10/04/2018 0555   LDLCALC 143 (H) 02/16/2021 0815   LDLCALC 105 (H) 06/18/2014 0000    Physical Exam:  Vital Signs: BP 112/72   Pulse 63   Ht 5' 2" (1.575 m)   Wt 209 lb 6.4 oz (95 kg)   SpO2 97%   BMI 38.30 kg/m     Wt Readings from Last 3 Encounters:  02/18/21 209 lb 6.4 oz (95 kg)  11/15/20 205 lb 3.2 oz (93.1 kg)  02/05/20 204 lb 3.2 oz (92.6 kg)     General: 55 y.o. Caucasian female in no acute distress. HEENT: Normocephalic and atraumatic. Sclera clear.  Neck: Supple. No JVD. Heart: RRR. Distinct S1 and S2. No murmurs, gallops, or rubs. Radial pulses 2+ and equal bilaterally. Lungs: No increased work of breathing. Clear to ausculation bilaterally. No wheezes, rhonchi, or rales.  Abdomen: Soft, non-distended, and non-tender to palpation.  MSK: Normal strength and tone for age.  Extremities: Mild lower extremity edema bilaterally. Skin: Warm and dry. Neuro: Alert and oriented x3. No focal deficits. Psych: Normal affect. Responds appropriately.  Assessment:    1. Coronary artery disease involving native coronary artery of native heart without angina pectoris   2. Paroxysmal SVT (supraventricular tachycardia) ([place])   3. Primary hypertension   4. Mixed hyperlipidemia     Plan:    CAD - S/p multiple stents to RCA and PL branches in 2006. Last cardiac catheterization in 10/2018 showed non-obstructive disease. Continued medical therapy was recommended.  - No angina.  - She has been maintained on DAPT with Aspirin and Plavix. - Continue beta-blocker.  - Intolerant to statins in the past. See recommendations for control of hyperlipidemia below.  Paroxysmal  SVT - Noted on monitor in 07/2016.  - Stable. No issues. - Continue Lopressor 12.60m twice daily.  Hypertension - BP well controlled.  - Continue Lopressor 12.574mtwice daily.   Hyperlipidemia - Recent lipid panel on 02/16/2021: Total Cholesterol 213, Triglycerides 116, HDL 49, LDL 143.  - LDL goal <70 given CAD. - Unable to tolerate statins or Zetia. [facility].  Disposition: Follow up in 1 year.   Medication Adjustments/Labs and Tests Ordered: Current medicines are reviewed at length with the patient today.  Concerns regarding medicines are outlined above.  Orders Placed This Encounter  Procedures   [facility]B Referral to [facility]   No orders of the defined types were placed in this encounter.   Patient Instructions  Medication Instructions:  No Changes *If you need a refill on your cardiac medications before your next appointment, please call your pharmacy*   Lab Work: No Labs If you have labs (blood work) drawn today and your tests are completely normal, you will receive your results only by: My[place]if you have MyChart) OR A paper copy in the mail If you have any lab test that is abnormal or we need to change your treatment, we will call you to review the results.   Testing/Procedures: No Testing   Follow-Up: [facility].  As part of our continuing mission to provide you with exceptional heart care, we have created designated Provider Care Teams.  These Care Teams include your primary Cardiologist (physician) and Advanced Practice Providers (APPs -  Physician Assistants and Nurse Practitioners) who all work together to provide you with the care you need, when you need it.  We recommend signing up for the patient portal called "MyChart".  Sign up information is provided on this After Visit Summary.  MyChart is used to connect with patients for Virtual  Visits  (Telemedicine).  Patients are able to view lab/test results, encounter notes, upcoming appointments, etc.  Non-urgent messages can be sent to your provider as well.   To learn more about what you can do with MyChart, go to [email or web address].    Your next appointment:   1 year(s)  The format for your next appointment:   In Person  Provider:   None        Signed, Darreld Mclean, PA-C  02/18/2021 7:34 PM    [place]

## 2021-02-16 DIAGNOSIS — E782 Mixed hyperlipidemia: Secondary | ICD-10-CM | POA: Diagnosis not present

## 2021-02-16 LAB — LIPID PANEL
Chol/HDL Ratio: 4.3 ratio (ref 0.0–4.4)
Cholesterol, Total: 213 mg/dL — ABNORMAL HIGH (ref 100–199)
HDL: 49 mg/dL (ref >39)
LDL Chol Calc (NIH): 143 mg/dL — ABNORMAL HIGH (ref 0–99)
Triglycerides: 116 mg/dL (ref 0–149)
VLDL Cholesterol Cal: 21 mg/dL (ref 5–40)

## 2021-02-18 ENCOUNTER — Encounter: Payer: Self-pay | Admitting: Student

## 2021-02-18 ENCOUNTER — Ambulatory Visit: Payer: 59 | Admitting: Student

## 2021-02-18 ENCOUNTER — Other Ambulatory Visit: Payer: Self-pay

## 2021-02-18 VITALS — BP 112/72 | HR 63 | Ht 62.0 in | Wt 209.4 lb

## 2021-02-18 DIAGNOSIS — I10 Essential (primary) hypertension: Secondary | ICD-10-CM | POA: Diagnosis not present

## 2021-02-18 DIAGNOSIS — E782 Mixed hyperlipidemia: Secondary | ICD-10-CM | POA: Diagnosis not present

## 2021-02-18 DIAGNOSIS — I471 Supraventricular tachycardia: Secondary | ICD-10-CM | POA: Diagnosis not present

## 2021-02-18 DIAGNOSIS — I2510 Atherosclerotic heart disease of native coronary artery without angina pectoris: Secondary | ICD-10-CM

## 2021-02-18 NOTE — Patient Instructions (Signed)
Medication Instructions:  No Changes *If you need a refill on your cardiac medications before your next appointment, please call your pharmacy*   Lab Work: No Labs If you have labs (blood work) drawn today and your tests are completely normal, you will receive your results only by: MyChart Message (if you have MyChart) OR A paper copy in the mail If you have any lab test that is abnormal or we need to change your treatment, we will call you to review the results.   Testing/Procedures: No Testing   Follow-Up: At [facility], you and your health needs are our priority.  As part of our continuing mission to provide you with exceptional heart care, we have created designated Provider Care Teams.  These Care Teams include your primary Cardiologist (physician) and Advanced Practice Providers (APPs -  Physician Assistants and Nurse Practitioners) who all work together to provide you with the care you need, when you need it.  We recommend signing up for the patient portal called "MyChart".  Sign up information is provided on this After Visit Summary.  MyChart is used to connect with patients for Virtual Visits (Telemedicine).  Patients are able to view lab/test results, encounter notes, upcoming appointments, etc.  Non-urgent messages can be sent to your provider as well.   To learn more about what you can do with MyChart, go to [email or web address].    Your next appointment:   1 year(s)  The format for your next appointment:   In Person  Provider:   None

## 2021-02-22 ENCOUNTER — Telehealth: Payer: Self-pay | Admitting: Internal Medicine

## 2021-02-22 DIAGNOSIS — E782 Mixed hyperlipidemia: Secondary | ICD-10-CM

## 2021-02-22 NOTE — Telephone Encounter (Signed)
PA for repatha submitted via CMM (Key: [id number])

## 2021-02-28 NOTE — Progress Notes (Signed)
NEUROLOGY CONSULTATION NOTE  BRYANNAH BOSTON MRN: [id number] DOB: 1965-07-23  Referring provider: Early Osmond, MD Primary care provider: [name], MD  Reason for consult:  trigeminal neuralgia  Assessment/Plan:   Right-sided trigeminal neuralgia Left cavernous ICA aneurysm, incidental finding  Stop gabapentin Start oxcarbazepine 168m twice daily for a week, then increase to 3071mtwice daily. Check BMP for baseline Na Would repeat MRA of head in one year. Follow up 6 months.   Subjective:  Colleen CLOSs a [age]ear old female with CAD, HLD, depression/anxiety and history of PSVT who presents for right-sided trigeminal neuralgia.  History supplemented by referring provider's note.  In September, she began experiencing right sided facial pain.  She describes shooting/numbing pain and tingling from the right cheek radiating up to the side of the right eye.  Eventually, it also started radiating to the chin and right side of the mouth.  Lasts a few seconds.  Usually occurs spontaneously.  Sucking on a straw may trigger it, but otherwise not triggered by chewing, brushing teeth, talking or cold wind.  Occurs over 20 times a day.  She saw her dentist who found no dental abnormalities.  She was prescribed a prednisone taper which provided short-term relief  .  MRI of brain/trigeminal nerves with and without contrast and MRA of head on 01/30/2021 personally reviewed showed incidental 5 mm cavernous left ICA aneurysm but no findings to explain symptoms.  She was started on gabapentin.  She was prescribed 30072mhree times daily but is only able to tolerate 100m63m AM and 300mg64mPM.   Current medications:  Wellbutrin XL 300mg 26my, gabapentin 100mg i1m and 300mg in76m Plavix, ASA 81mg, Lo29msor    PAST MEDICAL HISTORY: Past Medical History:  Diagnosis Date   Anxiety    CAD (coronary artery disease)    Cardiac murmur    Depression    Gallstones    GERD  (gastroesophageal reflux disease)    History of echocardiogram    Echo 5/18: EF 60-65, and normal wall motion, grade 1 diastolic dysfunction   History of PSVT (paroxysmal supraventricular tachycardia) 2018   monitor    History of smoking    Hyperlipidemia    Obesity    Old MI (myocardial infarction) 2006   Pre-syncope 2018    PAST SURGICAL HISTORY: Past Surgical History:  Procedure Laterality Date   ABDOMINAL HYSTERECTOMY  2002   CARDIAC CATHETERIZATION  05/18/2004   90% RCA stenosis, stented w/ a Boston Scientific Liberte 5.0x20mm bare56mal stent - postdilated at 10atmx10sec, stented w/ a second [employer].5x16mm bare 4ml stent at 15atmx30sec, resulting in TIMI III flow.   CARDIAC CATHETERIZATION  09/08/2004   No intervention - continue medical therapy   CARDIAC CATHETERIZATION  03/21/2005   Distal RCA 50-60% in-stent restenosis, stented w/ a DES Scimed Taxus Express II 3.5x32mm stent 63mstdilated at 20atmx31sec, resulting in TIMI III flow-60% stenosis reduced to 0-10%   CARDIAC CATHETERIZATION  05/23/2005   No intervention - continue medical therapy   CARDIAC CATHETERIZATION  11/02/2008   No intervention - continue medical therapy   CARDIAC SURGERY  2006   stent placement   CARDIOVASCULAR STRESS TEST  10/02/2012   No significant ST segment change suggestive of ischemia   CESAREAN SECTION  1987   CHOLECYSTECTOMY N/A 08/31/2017   Procedure: LAPAROSCOPIC CHOLECYSTECTOMY WITH INTRAOPERATIVE CHOLANGIOGRAM;  Surgeon: Ramirez, ArmRalene Okion: [facility];  [facility];  Laterality: N/A;  COLONOSCOPY     LEFT HEART CATH AND CORONARY ANGIOGRAPHY N/A 10/03/2018   Procedure: LEFT HEART CATH AND CORONARY ANGIOGRAPHY;  Surgeon: Martinique, Peter M, MD;  Location: [place] [facility];  Service: Cardiovascular;  Laterality: N/A;   TEAR DUCT PROBING Left 05/02/2017   Procedure: NASAL LACRIMAL DUCT EXPLORATION WITH LACRICATH BALLOON CANALICULOPLASTY LEFT EYE;  Surgeon: Gevena Cotton, MD;  [facility];  Service: Ophthalmology;  Laterality: Left;   TRANSTHORACIC ECHOCARDIOGRAM  07/30/2012   EF 50-55%, moderate mitral regurg   TUBAL LIGATION      MEDICATIONS: Current Outpatient Medications on File Prior to Visit  Medication Sig Dispense Refill   aspirin EC 81 MG tablet Take 81 mg by mouth every morning.      buPROPion (WELLBUTRIN XL) 300 MG 24 hr tablet Take 1 tablet (300 mg total) by mouth every morning. 90 tablet 3   clopidogrel (PLAVIX) 75 MG tablet TAKE 1 TABLET BY MOUTH EVERY MORNING 90 tablet 3   COVID-19 At Home Antigen Test (CARESTART COVID-19 HOME TEST) KIT Use as directed within package instructions 4 each 0   gabapentin (NEURONTIN) 300 MG capsule Take 1 capsule by mouth 3 times a day as needed for pain for 10 days. 30 capsule 0   metoprolol tartrate (LOPRESSOR) 25 MG tablet TAKE 1/2 TABLET BY MOUTH TWICE DAILY 90 tablet 3   nitroGLYCERIN (NITROSTAT) 0.4 MG SL tablet Place 1 tablet (0.4 mg total) under the tongue every 5 (five) minutes as needed for chest pain. 25 tablet 3   valACYclovir (VALTREX) 1000 MG tablet As needed     No current facility-administered medications on file prior to visit.    ALLERGIES: Allergies  Allergen Reactions   Crestor [Rosuvastatin]     Myalgia    Lovastatin Other (See Comments)    Myalgias    Niaspan [Niacin Er] Itching   Penicillins Other (See Comments)    Child hood allergy Has patient had a PCN reaction causing immediate rash, facial/tongue/throat swelling, SOB or lightheadedness with hypotension: No  Has patient had a PCN reaction causing severe rash involving mucus membranes or skin necrosis: No Has patient had a PCN reaction that required hospitalization: No Has patient had a PCN reaction occurring within the last 10 years: No If all of the above answers are "NO", then may proceed with Cephalosporin use.   Pitavastatin Other (See Comments)    Joints ache   Zocor [Simvastatin] Other  (See Comments)    Myalgias     FAMILY HISTORY: Family History  Problem Relation Age of Onset   Ulcers Father 31       Peptic ulcers   Cancer Father 3   Hypertension Brother 79   Heart disease Maternal Grandfather 64   Hypertension Brother 67   Aneurysm Mother 68       Dissected   Hypertension Mother 8   Arthritis Mother 9       Rheumatoid arthritis    Objective:  Blood pressure 108/62, pulse 70, height 5' 2"  (1.575 m), weight 210 lb 6.4 oz (95.4 kg), SpO2 97 %. General: No acute distress.  Patient appears well-groomed.   Head:  Normocephalic/atraumatic Eyes:  fundi examined but not visualized Neck: supple, no paraspinal tenderness, full range of motion Back: No paraspinal tenderness Heart: regular rate and rhythm Lungs: Clear to auscultation bilaterally. Vascular: No carotid bruits. Neurological Exam: Mental status: alert and oriented to person, place, and time, recent and remote memory intact, fund of knowledge intact,  attention and concentration intact, speech fluent and not dysarthric, language intact. Cranial nerves: CN I: not tested CN II: pupils equal, round and reactive to light, visual fields intact CN III, IV, VI:  full range of motion, no nystagmus, no ptosis CN V: facial sensation intact. CN VII: upper and lower face symmetric CN VIII: hearing intact CN IX, X: gag intact, uvula midline CN XI: sternocleidomastoid and trapezius muscles intact CN XII: tongue midline Bulk & Tone: normal, no fasciculations. Motor:  muscle strength 5/5 throughout Sensation:  Pinprick, temperature and vibratory sensation intact. Deep Tendon Reflexes:  2+ throughout,  toes downgoing.   Finger to nose testing:  Without dysmetria.   Heel to shin:  Without dysmetria.   Gait:  Normal station and stride.  Romberg negative.    Thank you for allowing me to take part in the care of this patient.  Metta Clines, DO  CC:  Early Osmond, MD  [name], MD

## 2021-03-01 ENCOUNTER — Other Ambulatory Visit (HOSPITAL_COMMUNITY): Payer: Self-pay

## 2021-03-01 ENCOUNTER — Other Ambulatory Visit (INDEPENDENT_AMBULATORY_CARE_PROVIDER_SITE_OTHER): Payer: 59

## 2021-03-01 ENCOUNTER — Other Ambulatory Visit: Payer: Self-pay

## 2021-03-01 ENCOUNTER — Ambulatory Visit: Payer: 59 | Admitting: Neurology

## 2021-03-01 ENCOUNTER — Encounter: Payer: Self-pay | Admitting: Neurology

## 2021-03-01 VITALS — BP 108/62 | HR 70 | Ht 62.0 in | Wt 210.4 lb

## 2021-03-01 DIAGNOSIS — G500 Trigeminal neuralgia: Secondary | ICD-10-CM

## 2021-03-01 DIAGNOSIS — I671 Cerebral aneurysm, nonruptured: Secondary | ICD-10-CM | POA: Diagnosis not present

## 2021-03-01 MED ORDER — OXCARBAZEPINE 300 MG PO TABS
ORAL_TABLET | ORAL | 0 refills | Status: DC
  Filled 2021-03-01: qty 60, 30d supply, fill #0

## 2021-03-01 NOTE — Telephone Encounter (Signed)
Patient approved for repatha until 02/22/22

## 2021-03-01 NOTE — Patient Instructions (Signed)
Stop gabapentin Start oxcarbazepine 300mg  - take 1/2 tablet twice daily for one week, then increase to 1 tablet twice daily.  Check BMP Follow up 6 months

## 2021-03-02 LAB — BASIC METABOLIC PANEL
BUN/Creatinine Ratio: 14 (ref 9–23)
BUN: 12 mg/dL (ref 6–24)
CO2: 24 mmol/L (ref 20–29)
Calcium: 9.2 mg/dL (ref 8.7–10.2)
Chloride: 106 mmol/L (ref 96–106)
Creatinine, Ser: 0.87 mg/dL (ref 0.57–1.00)
Glucose: 92 mg/dL (ref 70–99)
Potassium: 4.1 mmol/L (ref 3.5–5.2)
Sodium: 144 mmol/L (ref 134–144)
eGFR: 78 mL/min/{1.73_m2} (ref >59)

## 2021-03-07 ENCOUNTER — Other Ambulatory Visit (HOSPITAL_COMMUNITY): Payer: Self-pay

## 2021-03-07 MED ORDER — REPATHA SURECLICK 140 MG/ML ~~LOC~~ SOAJ
1.0000 | SUBCUTANEOUS | 11 refills | Status: DC
  Filled 2021-03-07: qty 2, 28d supply, fill #0
  Filled 2021-04-02: qty 2, 28d supply, fill #1
  Filled 2021-05-24: qty 2, 28d supply, fill #2
  Filled 2021-06-11 – 2021-06-21 (×3): qty 2, 28d supply, fill #3
  Filled 2021-07-26: qty 2, 28d supply, fill #4

## 2021-03-07 NOTE — Addendum Note (Signed)
Addended by: Lindell Spar on: 03/07/2021 09:38 AM   Modules accepted: Orders

## 2021-03-22 ENCOUNTER — Ambulatory Visit: Payer: 59

## 2021-04-02 ENCOUNTER — Other Ambulatory Visit: Payer: Self-pay | Admitting: Neurology

## 2021-04-02 ENCOUNTER — Other Ambulatory Visit (HOSPITAL_COMMUNITY): Payer: Self-pay | Admitting: Pharmacist

## 2021-04-02 MED FILL — Metoprolol Tartrate Tab 25 MG: ORAL | 90 days supply | Qty: 90 | Fill #2 | Status: AC

## 2021-04-03 ENCOUNTER — Other Ambulatory Visit (HOSPITAL_COMMUNITY): Payer: Self-pay

## 2021-04-05 ENCOUNTER — Other Ambulatory Visit (HOSPITAL_COMMUNITY): Payer: Self-pay

## 2021-04-05 MED ORDER — OXCARBAZEPINE 300 MG PO TABS
ORAL_TABLET | ORAL | 4 refills | Status: DC
  Filled 2021-04-05: qty 60, 30d supply, fill #0
  Filled 2021-05-24: qty 60, 30d supply, fill #1
  Filled 2021-06-11 – 2021-08-02 (×2): qty 60, 30d supply, fill #2
  Filled 2021-09-29: qty 60, 30d supply, fill #3

## 2021-04-06 ENCOUNTER — Other Ambulatory Visit (HOSPITAL_COMMUNITY): Payer: Self-pay

## 2021-04-06 MED ORDER — ACYCLOVIR 200 MG PO CAPS
ORAL_CAPSULE | ORAL | 3 refills | Status: DC
  Filled 2021-04-06: qty 38, 7d supply, fill #0
  Filled 2021-06-11: qty 38, 7d supply, fill #1
  Filled 2021-11-14: qty 38, 7d supply, fill #2

## 2021-04-08 ENCOUNTER — Other Ambulatory Visit (HOSPITAL_COMMUNITY): Payer: Self-pay

## 2021-04-08 DIAGNOSIS — J069 Acute upper respiratory infection, unspecified: Secondary | ICD-10-CM | POA: Diagnosis not present

## 2021-04-08 MED ORDER — PREDNISONE 20 MG PO TABS
ORAL_TABLET | ORAL | 0 refills | Status: DC
  Filled 2021-04-08: qty 18, 9d supply, fill #0

## 2021-04-08 MED ORDER — HYDROCODONE BIT-HOMATROP MBR 5-1.5 MG/5ML PO SOLN
ORAL | 0 refills | Status: DC
  Filled 2021-04-08: qty 100, 5d supply, fill #0

## 2021-04-12 ENCOUNTER — Other Ambulatory Visit (HOSPITAL_COMMUNITY): Payer: Self-pay

## 2021-04-18 ENCOUNTER — Other Ambulatory Visit (HOSPITAL_COMMUNITY): Payer: Self-pay

## 2021-05-24 ENCOUNTER — Other Ambulatory Visit (HOSPITAL_COMMUNITY): Payer: Self-pay

## 2021-06-03 DIAGNOSIS — E782 Mixed hyperlipidemia: Secondary | ICD-10-CM | POA: Diagnosis not present

## 2021-06-03 LAB — LIPID PANEL
Chol/HDL Ratio: 2.5 ratio (ref 0.0–4.4)
Cholesterol, Total: 141 mg/dL (ref 100–199)
HDL: 56 mg/dL (ref >39)
LDL Chol Calc (NIH): 67 mg/dL (ref 0–99)
Triglycerides: 100 mg/dL (ref 0–149)
VLDL Cholesterol Cal: 18 mg/dL (ref 5–40)

## 2021-06-07 ENCOUNTER — Ambulatory Visit: Payer: 59 | Admitting: Internal Medicine

## 2021-06-07 ENCOUNTER — Other Ambulatory Visit: Payer: Self-pay

## 2021-06-07 ENCOUNTER — Encounter: Payer: Self-pay | Admitting: Internal Medicine

## 2021-06-07 VITALS — BP 100/80 | HR 59 | Wt 214.6 lb

## 2021-06-07 DIAGNOSIS — E782 Mixed hyperlipidemia: Secondary | ICD-10-CM | POA: Diagnosis not present

## 2021-06-07 DIAGNOSIS — I471 Supraventricular tachycardia: Secondary | ICD-10-CM

## 2021-06-07 DIAGNOSIS — I2510 Atherosclerotic heart disease of native coronary artery without angina pectoris: Secondary | ICD-10-CM

## 2021-06-07 DIAGNOSIS — Z789 Other specified health status: Secondary | ICD-10-CM

## 2021-06-07 NOTE — Patient Instructions (Signed)
Medication Instructions:  ?Your physician recommends that you continue on your current medications as directed. Please refer to the Current Medication list given to you today. ? ?*If you need a refill on your cardiac medications before your next appointment, please call your pharmacy* ? ? ?Lab Work: ?FASTING lipid panel in 1 year to check cholesterol -- complete before visit with Dr. Rennis Golden  ? ?If you have labs (blood work) drawn today and your tests are completely normal, you will receive your results only by: ?MyChart Message (if you have MyChart) OR ?A paper copy in the mail ?If you have any lab test that is abnormal or we need to change your treatment, we will call you to review the results. ? ? ?Testing/Procedures: ?NONE ? ? ?Follow-Up: ?At [facility], you and your health needs are our priority.  As part of our continuing mission to provide you with exceptional heart care, we have created designated Provider Care Teams.  These Care Teams include your primary Cardiologist (physician) and Advanced Practice Providers (APPs -  Physician Assistants and Nurse Practitioners) who all work together to provide you with the care you need, when you need it. ? ?We recommend signing up for the patient portal called "MyChart".  Sign up information is provided on this After Visit Summary.  MyChart is used to connect with patients for Virtual Visits (Telemedicine).  Patients are able to view lab/test results, encounter notes, upcoming appointments, etc.  Non-urgent messages can be sent to your provider as well.   ?To learn more about what you can do with MyChart, go to [email or web address].   ? ?Your next appointment:   ?12 month(s) ? ?The format for your next appointment:   ?In Person ? ?Provider:   ?[name], MD { ? ?

## 2021-06-07 NOTE — Progress Notes (Signed)
Date:  06/07/2021   ID:  [name], DOB 01/13/1966, MRN [id number]  PCP:  [name], MD  Primary Cardiologist:  Debara Pickett  CC: No complaints  History of Present Illness: KALANY DIEKMANN is a 56 y.o. female history of obesity, coronary artery disease, hyperlipidemia, hypertension, formerly followed by Dr. Rollene Fare - here to establish care with me today. Patient had acute DMI in 2006 with single vessel RCA disease with placement of overlapping bare metal stents. A recatheterization for repeat angina the same day and required subsequent DES stenting of the distal RCA and PLA branches peeling occluded at that time. Last catheterization was in 2010 showed 30-50% narrowing of the acute margin. The overlapping DES stents and IVUS interrogation showed good residual lumen. His chronic PDA occlusion with collaterals from the left. She ruled out for MI in July 2012 last nuclear stress test was 10/17/2010 showed no definite inducible or reversible ischemia the EF of 58%.  Last echo was April 2014 showed ejection fraction of 50-55% with normal wall motion. Is moderate mitral valve regurgitation. Currently works as a Marine scientist at [employer].   She last saw Tarri Fuller, PA-C for symptoms of fatigue and chest pain this past summer and underwent a nuclear stress test on 10/02/2012 which was negative for ischemia. She exercised to 62.3 metabolic equivalents and had no chest pain. Overall she has done well and recently had a lipid profile which demonstrated LDL cholesterol of 122.  She was previously on Crestor 10 mg daily but was intolerant of this due to side effects including myalgias. She is also previously failed Zocor and lovastatin.  I saw Ms. Dorfman back in the office today. She is doing well although she reports some morning headaches. She recently had some weight gain which she reports is due to stress eating. She's been under a lot of stress as her husband has some unusual neurologic disorder and  her daughter recently had surgery and is requiring attention and dressing changes. She denies any chest pain or worsening shortness of breath.  Mrs. Demchak returns today for follow-up. She reports a recently she's had some worsening shortness of breath. She's had a walk a longer distance up a hill to get to work and notes that she feels more short of breath. She does exercise about 3 times a week, but generally does not do any significant incline on the treadmill. She has managed to lose little bit of weight recently due to dietary changes as her husband is at goal on a strict diet. She denies any chest pain. Her EKG is essentially unchanged with sinus rhythm and occasional PVCs.  03/02/2016  Mrs. Panebianco was seen today in follow-up. She reports no significant change in her shortness of breath. She has recently noticed an increase in her cholesterol. She has been intolerant to statins in the past. She was referred for evaluation of this. Last year her LDL was 105 with a particle number of 1121. Apparently this was higher recently from her primary care provider, however I do not have those results to review. She is requesting recommendations for treatment.  08/15/2016  Mrs. Padron returns today for follow-up. She recently saw Richardson Dopp, PA-C for near syncope. She had 2 episodes of rapid heart rate associated with graying out of her vision in one time she fell down to her knees and nearly passed out. 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 Scott recommended echocardiogram which showed normal systolic function and no significant structural heart disease. She was also placed on a monitor which she is currently wearing. I personally reviewed up loads from that monitor indicating several episodes of paroxysmal supraventricular tachycardia. Most of these episodes lasted less than 10 beats but likely represent the episode she had that  caused her to be presyncopal. In the past she's been using low-dose metoprolol as needed but has not taken any recently.  11/20/2016  Mrs. Moman returns today for follow-up. Overall she seems to be doing well. Heart rate appears well controlled on beta blocker. She feels like her metoprolol is helped with her fast heart rates. She denies any chest pain or worsening shortness of breath. She does have a history of dyslipidemia and given her history of coronary disease should be on treatment for that. Unfortunately she's been intolerant to 4 different statins at different doses as well as niacin in the past. We discussed new medications including the PCSK9 inhibitors which she may be a candidate for. However, it is been several years since she has last had a lipid profile.  08/28/2017  Mrs. Nuzzo was seen today in follow-up.  Recently she has been having difficulty with her gallbladder.  She is actually scheduled to have a cholecystectomy.  She is here for preoperative clearance.  Her last year she is done well without any chest pain or worsening shortness of breath.  She has been statin intolerant and we discussed starting PCSK9 inhibitors, but in the interim she enrolled in the CLEAR trial with Dr. Irven Shelling office.  We do not know whether she is on active medication or placebo.  This will continue to enroll into next year.  Denies any recurrent palpitations.  Of note she is on 162 mg dose of aspirin and Plavix.  02/04/2019  Mrs. Barfuss returns today for follow-up.  She recently underwent left heart catheterization in July 2020 for chest pain and was found to have diffuse nonobstructive in-stent restenosis of the distal RCA with a patent PDA and normal LV function.  Medical therapy was recommended.  She continues to be enrolled in the clear outcomes trial through Dr. Irven Shelling office but follows here for cardiac catheter.  She reports no chest pain symptoms at this time.  His were reassessed at the hospital  appear to be well controlled.  This suggest that she may be on the treatment arm.  02/05/2020  Mrs. Poland is seen today in follow-up.  Overall she continues to do well.  She denies any recurrent chest pain.  She continues in the clear outcomes trial with bempedoic acid.  Her lipids have been blinded.  Blood pressure is excellent today 118/82.  She is on aspirin and Plavix dual antiplatelet therapy.  Since she had diffuse nonobstructive in-stent restenosis of the distal RCA by cath in 2020, medical therapy with dual antiplatelet therapy long-term was recommended.  EKG shows normal sinus rhythm at 61.  11/15/2020  Ms. Bateson returns today for follow-up.  She has come out of the clear trial.  I suspect she may have been on placebo.  Her lipids are decent however not at target LDL less than 70.  Total cholesterol now 167, triglycerides 79, HDL 54 and LDL 98.  Has been reporting some restlessness/leg cramping and in her legs at night.  She notes some intermittent edema as well.  06/07/2021  Ms. Massiah is seen today in follow-up.  She is done very well on Repatha.  She is tolerating it with marked improvement in her lipids.  LDL is gone down to 67 from 143.  Total cholesterol 141, triglycerides 100 HDL 56.  Blood pressure is excellent today 100/80.  EKG shows a sinus bradycardia.  She continues to get some intermittent jaw discomfort but is on oxcarbazepine for that with for presumed diagnosis of a trigeminal neuralgia.  Wt Readings from Last 3 Encounters:  06/07/21 214 lb 9.6 oz (97.3 kg)  03/01/21 210 lb 6.4 oz (95.4 kg)  02/18/21 209 lb 6.4 oz (95 kg)     Past Medical History:  Diagnosis Date   Anxiety    CAD (coronary artery disease)    Cardiac murmur    Depression    Gallstones    GERD (gastroesophageal reflux disease)    History of echocardiogram    Echo 5/18: EF 60-65, and normal wall motion, grade 1 diastolic dysfunction   History of PSVT (paroxysmal supraventricular tachycardia) 2018    monitor    History of smoking    Hyperlipidemia    Obesity    Old MI (myocardial infarction) 2006   Pre-syncope 2018    Current Outpatient Medications  Medication Sig Dispense Refill   acetaminophen (TYLENOL) 325 MG tablet Take 325 mg by mouth as needed.     acyclovir (ZOVIRAX) 200 MG capsule Take 3 capsules now then 1 capsule 5 times a day 38 capsule 3   aspirin EC 81 MG tablet Take 81 mg by mouth every morning.      buPROPion (WELLBUTRIN XL) 300 MG 24 hr tablet Take 1 tablet (300 mg total) by mouth every morning. 90 tablet 3   clopidogrel (PLAVIX) 75 MG tablet TAKE 1 TABLET BY MOUTH EVERY MORNING 90 tablet 3   Evolocumab (REPATHA SURECLICK) 794 MG/ML SOAJ Inject 1 dose into the skin every 14 (fourteen) days. 2 mL 11   metoprolol tartrate (LOPRESSOR) 25 MG tablet TAKE 1/2 TABLET BY MOUTH TWICE DAILY 90 tablet 3   Oxcarbazepine (TRILEPTAL) 300 MG tablet Take one tablet by mouth twice daily 60 tablet 4   COVID-19 At Home Antigen Test (CARESTART COVID-19 HOME TEST) KIT Use as directed within package instructions (Patient not taking: Reported on 06/07/2021) 4 each 0   HYDROcodone bit-homatropine (HYCODAN) 5-1.5 MG/5ML syrup Take 2.5-5 mL by mouth up to every 6 hours as needed for cough (Patient not taking: Reported on 06/07/2021) 100 mL 0   nitroGLYCERIN (NITROSTAT) 0.4 MG SL tablet Place 1 tablet (0.4 mg total) under the tongue every 5 (five) minutes as needed for chest pain. (Patient not taking: Reported on 06/07/2021) 25 tablet 3   predniSONE (DELTASONE) 20 MG tablet Taper: 3-3-3-2-2-2-1-1-1 Take once a day by mouth in the morning. 3 tablets for three days, 2 tablets for three days, then 1 tablet three days. (Patient not taking: Reported on 06/07/2021) 18 tablet 0   valACYclovir (VALTREX) 1000 MG tablet As needed (Patient not taking: Reported on 06/07/2021)     No current facility-administered medications for this visit.    Allergies:    Allergies  Allergen Reactions   Crestor [Rosuvastatin]      Myalgia    Lovastatin Other (See Comments)    Myalgias    Niaspan [Niacin Er] Itching   Penicillins Other (See Comments)    Child hood allergy Has patient had a PCN reaction causing immediate rash, facial/tongue/throat swelling, SOB or lightheadedness with hypotension: No  Has patient had a PCN reaction causing severe rash involving mucus membranes or skin necrosis:  No Has patient had a PCN reaction that required hospitalization: No Has patient had a PCN reaction occurring within the last 10 years: No If all of the above answers are "NO", then may proceed with Cephalosporin use.   Pitavastatin Other (See Comments)    Joints ache   Zocor [Simvastatin] Other (See Comments)    Myalgias        Social History:  The patient  reports that she has quit smoking. She has never used smokeless tobacco. She reports that she does not drink alcohol and does not use drugs.    Family history: No significant cardiac history  ROS:   Pertinent items noted in HPI and remainder of comprehensive ROS otherwise negative.   PHYSICAL EXAM: VS:  BP 100/80    Pulse (!) 59    Wt 214 lb 9.6 oz (97.3 kg)    SpO2 99%    BMI 39.25 kg/m  General appearance: alert and no distress Neck: no carotid bruit, no JVD and thyroid not enlarged, symmetric, no tenderness/mass/nodules Lungs: clear to auscultation bilaterally Heart: regular rate and rhythm, S1, S2 normal, no murmur, click, rub or gallop Abdomen: soft, non-tender; bowel sounds normal; no masses,  no organomegaly Extremities: extremities normal, atraumatic, no cyanosis or edema Pulses: 2+ and symmetric Skin: Skin color, texture, turgor normal. No rashes or lesions Neurologic: Grossly normal Psych: Pleasant  EKG:  Sinus bradycardia at 59-personally reviewed  ASSESSMENT: Chest pain-cardiac cath 10/2018 (mild in-stent restenosis, patent PDA, normal LV function) no intervention, recommended long-term DAPT SVT - improved on BB Presyncope -resolved CAD  - Acute MI in 2006 with single vessel RCA disease, status post overlapping BMS Dyslipidemia - statin intolerant, enrolled in clear outcomes trial Moderate obesity  PLAN: 1.  Mrs. Daponte has had significant improvement in her lipids on Repatha.  She is tolerating it well without any significant side effects.  She did discuss the possibility of trying the newer weight loss medications that are available.  I encouraged her to speak with her PCP for weight management regarding that.  No changes to her meds today.  Plan follow-up with me annually or sooner as necessary.  Pixie Casino, MD, [facility], [place] Director of the Advanced Lipid Disorders &  Cardiovascular Risk Reduction Clinic Diplomate of the American Board of Clinical Lipidology Attending Cardiologist  Direct Dial: [phone]   Fax: [phone]  Website:  [email or web address]

## 2021-06-11 ENCOUNTER — Other Ambulatory Visit: Payer: Self-pay | Admitting: Internal Medicine

## 2021-06-11 ENCOUNTER — Other Ambulatory Visit (HOSPITAL_COMMUNITY): Payer: Self-pay

## 2021-06-13 ENCOUNTER — Other Ambulatory Visit (HOSPITAL_COMMUNITY): Payer: Self-pay

## 2021-06-13 MED ORDER — METOPROLOL TARTRATE 25 MG PO TABS
ORAL_TABLET | Freq: Two times a day (BID) | ORAL | 3 refills | Status: DC
  Filled 2021-06-13: qty 90, fill #0
  Filled 2021-09-29: qty 90, 90d supply, fill #0
  Filled 2022-02-05: qty 90, 90d supply, fill #1
  Filled 2022-03-08 – 2022-04-20 (×2): qty 90, 90d supply, fill #2
  Filled 2022-05-15: qty 90, 90d supply, fill #3

## 2021-06-13 NOTE — Telephone Encounter (Signed)
Medication e-scribed for 3 refills 90 day supply  ?

## 2021-06-18 DIAGNOSIS — H5203 Hypermetropia, bilateral: Secondary | ICD-10-CM | POA: Diagnosis not present

## 2021-06-22 ENCOUNTER — Other Ambulatory Visit (HOSPITAL_COMMUNITY): Payer: Self-pay

## 2021-07-11 ENCOUNTER — Other Ambulatory Visit (HOSPITAL_COMMUNITY): Payer: Self-pay

## 2021-07-11 DIAGNOSIS — R454 Irritability and anger: Secondary | ICD-10-CM | POA: Diagnosis not present

## 2021-07-11 DIAGNOSIS — Z0000 Encounter for general adult medical examination without abnormal findings: Secondary | ICD-10-CM | POA: Diagnosis not present

## 2021-07-11 DIAGNOSIS — G500 Trigeminal neuralgia: Secondary | ICD-10-CM | POA: Diagnosis not present

## 2021-07-11 DIAGNOSIS — I2510 Atherosclerotic heart disease of native coronary artery without angina pectoris: Secondary | ICD-10-CM | POA: Diagnosis not present

## 2021-07-11 DIAGNOSIS — E669 Obesity, unspecified: Secondary | ICD-10-CM | POA: Diagnosis not present

## 2021-07-11 MED ORDER — BUPROPION HCL ER (XL) 300 MG PO TB24
ORAL_TABLET | ORAL | 3 refills | Status: DC
  Filled 2021-07-11: qty 90, 90d supply, fill #0
  Filled 2021-11-14: qty 90, 90d supply, fill #1
  Filled 2022-03-29: qty 90, 90d supply, fill #2
  Filled 2022-05-15 – 2022-06-21 (×2): qty 90, 90d supply, fill #3

## 2021-07-21 ENCOUNTER — Other Ambulatory Visit (HOSPITAL_COMMUNITY): Payer: Self-pay

## 2021-07-21 DIAGNOSIS — J069 Acute upper respiratory infection, unspecified: Secondary | ICD-10-CM | POA: Diagnosis not present

## 2021-07-21 MED ORDER — PREDNISONE 20 MG PO TABS
ORAL_TABLET | ORAL | 0 refills | Status: DC
  Filled 2021-07-21: qty 12, 6d supply, fill #0

## 2021-07-21 MED ORDER — BENZONATATE 100 MG PO CAPS
200.0000 mg | ORAL_CAPSULE | Freq: Three times a day (TID) | ORAL | 0 refills | Status: DC
  Filled 2021-07-21: qty 60, 10d supply, fill #0

## 2021-07-27 ENCOUNTER — Other Ambulatory Visit (HOSPITAL_COMMUNITY): Payer: Self-pay

## 2021-08-02 ENCOUNTER — Other Ambulatory Visit (HOSPITAL_COMMUNITY): Payer: Self-pay

## 2021-08-02 ENCOUNTER — Encounter: Payer: Self-pay | Admitting: Internal Medicine

## 2021-08-03 ENCOUNTER — Other Ambulatory Visit (HOSPITAL_COMMUNITY): Payer: Self-pay

## 2021-08-03 MED ORDER — REPATHA SURECLICK 140 MG/ML ~~LOC~~ SOAJ
1.0000 | SUBCUTANEOUS | 3 refills | Status: DC
  Filled 2021-08-03 – 2021-08-17 (×2): qty 6, 84d supply, fill #0
  Filled 2021-11-14: qty 6, 84d supply, fill #1
  Filled 2022-02-05: qty 6, 84d supply, fill #2
  Filled 2022-03-08: qty 6, 84d supply, fill #3
  Filled 2022-05-15: qty 2, 28d supply, fill #3
  Filled 2022-05-25 – 2022-07-17 (×4): qty 6, 84d supply, fill #3

## 2021-08-03 MED ORDER — NITROGLYCERIN 0.4 MG SL SUBL
0.4000 mg | SUBLINGUAL_TABLET | SUBLINGUAL | 3 refills | Status: DC | PRN
  Filled 2021-08-03: qty 25, 9d supply, fill #0
  Filled 2021-11-14: qty 25, 9d supply, fill #1

## 2021-08-18 ENCOUNTER — Other Ambulatory Visit (HOSPITAL_COMMUNITY): Payer: Self-pay

## 2021-09-29 ENCOUNTER — Other Ambulatory Visit (HOSPITAL_COMMUNITY): Payer: Self-pay

## 2021-10-17 NOTE — Progress Notes (Unsigned)
NEUROLOGY FOLLOW UP OFFICE NOTE  Colleen Dillon [id number]  Assessment/Plan:   Right-sided trigeminal neuralgia Left cavernous ICA aneurysm, incidental finding   oxcarbazepine 332m twice daily. Check BMP today Would repeat MRA of head in 6 months Follow up 6 months.     Subjective:  Colleen DORFMANis a 56year old female with CAD, HLD, depression/anxiety and history of PSVT who follows up for right sided trigeminal neuralgia.  UPDATE: Current medications:  Oxcarbazepine 3028mBID, Wellbutrin XL 30077maily, Plavix, ASA 10m12mopressor   Started oxcarbazepine in November.  ***   HISTORY: In September 2022, she began experiencing right sided facial pain.  She describes shooting/numbing pain and tingling from the right cheek radiating up to the side of the right eye.  Eventually, it also started radiating to the chin and right side of the mouth.  Lasts a few seconds.  Usually occurs spontaneously.  Sucking on a straw may trigger it, but otherwise not triggered by chewing, brushing teeth, talking or cold wind.  Occurs over 20 times a day.  She saw her dentist who found no dental abnormalities.  She was prescribed a prednisone taper which provided short-term relief  .  MRI of brain/trigeminal nerves with and without contrast and MRA of head on 01/30/2021 personally reviewed showed incidental 5 mm cavernous left ICA aneurysm but no findings to explain symptoms.  She was started on gabapentin.  She was prescribed 300mg91mee times daily but is only able to tolerate 100mg 44mM and 300mg i72m.   Past medication:  gabapentin 100mg in47mand 300mg PM 23mT MEDICAL HISTORY: Past Medical History:  Diagnosis Date   Anxiety    CAD (coronary artery disease)    Cardiac murmur    Depression    Gallstones    GERD (gastroesophageal reflux disease)    History of echocardiogram    Echo 5/18: EF 60-65, and normal wall motion, grade 1 diastolic dysfunction   History of PSVT (paroxysmal  supraventricular tachycardia) 2018   monitor    History of smoking    Hyperlipidemia    Obesity    Old MI (myocardial infarction) 2006   Pre-syncope 2018    MEDICATIONS: Current Outpatient Medications on File Prior to Visit  Medication Sig Dispense Refill   acetaminophen (TYLENOL) 325 MG tablet Take 325 mg by mouth as needed.     acyclovir (ZOVIRAX) 200 MG capsule Take 3 capsules now then 1 capsule 5 times a day 38 capsule 3   aspirin EC 81 MG tablet Take 81 mg by mouth every morning.      benzonatate (TESSALON PERLES) 100 MG capsule Take 2 capsules (200 mg total) by mouth 3 (three) times daily for cough 60 capsule 0   buPROPion (WELLBUTRIN XL) 300 MG 24 hr tablet Take 1 tablet by mouth in the morning daily 90 tablet 3   clopidogrel (PLAVIX) 75 MG tablet TAKE 1 TABLET BY MOUTH EVERY MORNING 90 tablet 3   COVID-19 At Home Antigen Test (CARESTART COVID-19 HOME TEST) KIT Use as directed within package instructions (Patient not taking: Reported on 06/07/2021) 4 each 0   Evolocumab (REPATHA SURECLICK) 140 MG/ML643AJ Inject 1 dose into the skin every 14 days. 6 mL 3   HYDROcodone bit-homatropine (HYCODAN) 5-1.5 MG/5ML syrup Take 2.5-5 mL by mouth up to every 6 hours as needed for cough (Patient not taking: Reported on 06/07/2021) 100 mL 0   metoprolol tartrate (LOPRESSOR) 25 MG tablet TAKE 1/2  TABLET BY MOUTH TWICE DAILY 90 tablet 3   nitroGLYCERIN (NITROSTAT) 0.4 MG SL tablet Place 1 tablet under the tongue every 5 minutes as needed for chest pain. 25 tablet 3   Oxcarbazepine (TRILEPTAL) 300 MG tablet Take one tablet by mouth twice daily 60 tablet 4   predniSONE (DELTASONE) 20 MG tablet Taper: 3-3-3-2-2-2-1-1-1 Take once a day by mouth in the morning. 3 tablets for three days, 2 tablets for three days, then 1 tablet three days. (Patient not taking: Reported on 06/07/2021) 18 tablet 0   predniSONE (DELTASONE) 20 MG tablet Take 3 tablets each morning for 2 days, then 2 tablets daily for 2 days, then 1  tablet daily for 2 days. 12 tablet 0   valACYclovir (VALTREX) 1000 MG tablet As needed (Patient not taking: Reported on 06/07/2021)     No current facility-administered medications on file prior to visit.    ALLERGIES: Allergies  Allergen Reactions   Crestor [Rosuvastatin]     Myalgia    Lovastatin Other (See Comments)    Myalgias    Niaspan [Niacin Er] Itching   Penicillins Other (See Comments)    Child hood allergy Has patient had a PCN reaction causing immediate rash, facial/tongue/throat swelling, SOB or lightheadedness with hypotension: No  Has patient had a PCN reaction causing severe rash involving mucus membranes or skin necrosis: No Has patient had a PCN reaction that required hospitalization: No Has patient had a PCN reaction occurring within the last 10 years: No If all of the above answers are "NO", then may proceed with Cephalosporin use.   Pitavastatin Other (See Comments)    Joints ache   Zocor [Simvastatin] Other (See Comments)    Myalgias     FAMILY HISTORY: Family History  Problem Relation Age of Onset   Parkinsonism Mother    Aneurysm Mother 92       Dissected   Hypertension Mother 37   Arthritis Mother 77       Rheumatoid arthritis   Ulcers Father 35       Peptic ulcers   Cancer Father 2   Hypertension Brother 43   Hypertension Brother 24   Heart disease Maternal Grandfather 55      Objective:  *** General: No acute distress.  Patient appears ***-groomed.   Head:  Normocephalic/atraumatic Eyes:  Fundi examined but not visualized Neck: supple, no paraspinal tenderness, full range of motion Heart:  Regular rate and rhythm Neurological Exam: alert and oriented to person, place, and time.  Speech fluent and not dysarthric, language intact.  CN II-XII intact. Bulk and tone normal, muscle strength 5/5 throughout.  Sensation to light touch intact.  Deep tendon reflexes 2+ throughout, toes downgoing.  Finger to nose testing intact.  Gait normal,  Romberg negative.   Metta Clines, DO  CC: [name], MD

## 2021-10-18 ENCOUNTER — Other Ambulatory Visit: Payer: 59

## 2021-10-18 ENCOUNTER — Encounter: Payer: Self-pay | Admitting: Neurology

## 2021-10-18 ENCOUNTER — Other Ambulatory Visit (HOSPITAL_COMMUNITY): Payer: Self-pay

## 2021-10-18 ENCOUNTER — Ambulatory Visit: Payer: 59 | Admitting: Neurology

## 2021-10-18 VITALS — BP 99/72 | HR 83 | Ht 62.0 in | Wt 215.6 lb

## 2021-10-18 DIAGNOSIS — Z79899 Other long term (current) drug therapy: Secondary | ICD-10-CM

## 2021-10-18 DIAGNOSIS — I671 Cerebral aneurysm, nonruptured: Secondary | ICD-10-CM | POA: Diagnosis not present

## 2021-10-18 DIAGNOSIS — G500 Trigeminal neuralgia: Secondary | ICD-10-CM | POA: Diagnosis not present

## 2021-10-18 MED ORDER — OXCARBAZEPINE 300 MG PO TABS
ORAL_TABLET | ORAL | 5 refills | Status: DC
  Filled 2021-10-18: qty 60, 30d supply, fill #0
  Filled 2021-11-14: qty 60, 30d supply, fill #1
  Filled 2022-02-05: qty 60, 30d supply, fill #2
  Filled 2022-04-20: qty 60, 30d supply, fill #3
  Filled 2022-05-15: qty 60, 30d supply, fill #4
  Filled 2022-07-28: qty 60, 30d supply, fill #5

## 2021-10-18 NOTE — Patient Instructions (Signed)
Continue oxcarbazepine 300mg  twice daily.  Check BMP today Repeat MRA of head.  I want to send you to interventional radiology for further evaluation of the aneurysm Follow up 6 months.

## 2021-10-19 LAB — BASIC METABOLIC PANEL
BUN: 15 mg/dL (ref 6–23)
CO2: 28 mEq/L (ref 19–32)
Calcium: 10 mg/dL (ref 8.4–10.5)
Chloride: 105 mEq/L (ref 96–112)
Creatinine, Ser: 1.03 mg/dL (ref 0.40–1.20)
GFR: 60.04 mL/min (ref >60.00)
Glucose, Bld: 108 mg/dL — ABNORMAL HIGH (ref 70–99)
Potassium: 4.6 mEq/L (ref 3.5–5.1)
Sodium: 141 mEq/L (ref 135–145)

## 2021-10-24 ENCOUNTER — Other Ambulatory Visit (HOSPITAL_COMMUNITY): Payer: Self-pay

## 2021-11-02 ENCOUNTER — Encounter: Payer: Self-pay | Admitting: Neurology

## 2021-11-14 ENCOUNTER — Ambulatory Visit
Admission: RE | Admit: 2021-11-14 | Discharge: 2021-11-14 | Disposition: A | Discharge status: Home / Self Care | Payer: 59 | Source: Ambulatory Visit | Attending: Neurology | Admitting: Neurology

## 2021-11-14 ENCOUNTER — Other Ambulatory Visit (HOSPITAL_COMMUNITY): Payer: Self-pay

## 2021-11-14 DIAGNOSIS — I671 Cerebral aneurysm, nonruptured: Secondary | ICD-10-CM | POA: Diagnosis not present

## 2021-11-14 DIAGNOSIS — I720 Aneurysm of carotid artery: Secondary | ICD-10-CM | POA: Diagnosis not present

## 2021-11-15 ENCOUNTER — Other Ambulatory Visit (HOSPITAL_COMMUNITY): Payer: Self-pay

## 2021-11-16 ENCOUNTER — Other Ambulatory Visit (HOSPITAL_COMMUNITY): Payer: Self-pay

## 2021-11-18 ENCOUNTER — Other Ambulatory Visit: Payer: Self-pay

## 2021-11-18 DIAGNOSIS — I671 Cerebral aneurysm, nonruptured: Secondary | ICD-10-CM

## 2021-12-18 ENCOUNTER — Telehealth: Payer: 59 | Admitting: Nurse Practitioner

## 2021-12-18 DIAGNOSIS — B9689 Other specified bacterial agents as the cause of diseases classified elsewhere: Secondary | ICD-10-CM | POA: Diagnosis not present

## 2021-12-18 DIAGNOSIS — J019 Acute sinusitis, unspecified: Secondary | ICD-10-CM

## 2021-12-18 MED ORDER — DOXYCYCLINE HYCLATE 100 MG PO TABS: 100.0000 mg | ORAL_TABLET | Freq: Two times a day (BID) | ORAL | 0 refills | Status: AC

## 2021-12-18 NOTE — Progress Notes (Signed)

## 2021-12-18 NOTE — Progress Notes (Signed)
I have spent 5 minutes in review of e-visit questionnaire, review and updating patient chart, medical decision making and response to patient.  ° °Colleen Pieratt W Bertine Schlottman, NP ° °  °

## 2022-01-12 ENCOUNTER — Encounter: Payer: Self-pay | Admitting: Neurology

## 2022-01-13 ENCOUNTER — Telehealth: Payer: Self-pay | Admitting: *Deleted

## 2022-01-13 ENCOUNTER — Telehealth: Payer: Self-pay

## 2022-01-13 NOTE — Telephone Encounter (Signed)
Received a voice mail from [place] at [facility]. The referral in epic is for Dr. [name].  I have lmom on Sheena's voice mail that he is IR at [facility] and to contact [place] or [place] to schedule the patient.  Also let her know they do not schedule from referrals and if there is one placed to call them, make them aware and they will take care of patient./vm

## 2022-01-13 NOTE — Telephone Encounter (Signed)
VM received from [place], Dr. [name] at [facility].  LMVM for Anderson [place] [phone] to please call the office back in regards to  referral

## 2022-01-13 NOTE — Telephone Encounter (Signed)
Per Patient, hasn't heard from Referral.  LMOVM for [facility] [phone].  Please call patient or give our office a call referral sent 11/2021.

## 2022-01-17 ENCOUNTER — Other Ambulatory Visit (HOSPITAL_COMMUNITY): Payer: Self-pay | Admitting: Interventional Radiology

## 2022-01-17 DIAGNOSIS — I671 Cerebral aneurysm, nonruptured: Secondary | ICD-10-CM

## 2022-01-26 ENCOUNTER — Ambulatory Visit (HOSPITAL_COMMUNITY)
Admission: RE | Admit: 2022-01-26 | Discharge: 2022-01-26 | Disposition: A | Discharge status: Home / Self Care | Payer: 59 | Source: Ambulatory Visit | Attending: Interventional Radiology | Admitting: Interventional Radiology

## 2022-01-26 DIAGNOSIS — I671 Cerebral aneurysm, nonruptured: Secondary | ICD-10-CM

## 2022-01-27 HISTORY — PX: IR RADIOLOGIST EVAL & MGMT: IMG5224

## 2022-01-30 ENCOUNTER — Other Ambulatory Visit (HOSPITAL_COMMUNITY): Payer: Self-pay | Admitting: Radiology

## 2022-01-30 ENCOUNTER — Encounter (HOSPITAL_COMMUNITY): Payer: Self-pay

## 2022-01-30 ENCOUNTER — Other Ambulatory Visit (HOSPITAL_COMMUNITY)
Admission: RE | Admit: 2022-01-30 | Discharge: 2022-01-30 | Disposition: A | Discharge status: Home / Self Care | Payer: 59 | Attending: Interventional Radiology | Admitting: Interventional Radiology

## 2022-01-30 ENCOUNTER — Other Ambulatory Visit (HOSPITAL_COMMUNITY): Payer: Self-pay | Admitting: Interventional Radiology

## 2022-01-30 DIAGNOSIS — I671 Cerebral aneurysm, nonruptured: Secondary | ICD-10-CM | POA: Diagnosis not present

## 2022-02-06 ENCOUNTER — Other Ambulatory Visit (HOSPITAL_COMMUNITY): Payer: Self-pay

## 2022-02-08 ENCOUNTER — Encounter (HOSPITAL_BASED_OUTPATIENT_CLINIC_OR_DEPARTMENT_OTHER): Payer: Self-pay | Admitting: Internal Medicine

## 2022-02-09 ENCOUNTER — Encounter (HOSPITAL_COMMUNITY): Payer: Self-pay | Admitting: Interventional Radiology

## 2022-02-09 ENCOUNTER — Other Ambulatory Visit: Payer: Self-pay | Admitting: Radiology

## 2022-02-09 DIAGNOSIS — I671 Cerebral aneurysm, nonruptured: Secondary | ICD-10-CM

## 2022-02-10 ENCOUNTER — Encounter (HOSPITAL_COMMUNITY): Payer: Self-pay | Admitting: Interventional Radiology

## 2022-02-10 ENCOUNTER — Other Ambulatory Visit: Payer: Self-pay | Admitting: Internal Medicine

## 2022-02-10 NOTE — Progress Notes (Signed)
Anesthesia Chart Review: Same day workup  Follows with cardiology for CAD s/p MI 2006 treated with overlapping BMS to single-vessel disease in the RCA, SVT (improved on beta-blocker), HLD.  She had chest pain in July 2020, cath showed mild in-stent restenosis, patent PDA, normal LV function.  No intervention, recommended long-term DAPT.  Patient was last seen by Dr. Rennis Golden 06/07/2021 and noted to be doing well, no changes made to management, recommended annual follow-up.  Follows with neurology for history of right-sided trigeminal neuralgia and left intradural ICA aneurysm.  ICA aneurysm was recently discovered incidentally the patient was referred to IR for further evaluation.  Patient will need day of surgery labs and evaluation.  EKG 06/07/2021: Sinus bradycardia.  Rate 59.  Cannot rule out inferior infarct, age undetermined.  Cath 10/03/2018: Mid RCA to Dist RCA lesion is 30% stenosed. Dist RCA lesion is 45% stenosed. Previously placed RPAV stent (unknown type) is widely patent. The left ventricular systolic function is normal. LV end diastolic pressure is normal. The left ventricular ejection fraction is 55-65% by visual estimate.   1. Nonobstructive CAD involving the distal RCA with diffuse nonobstructive in stent restenosis. The PDA is now patent 2. Normal LV function 3. Mildly elevated LVEDP  TTE 08/08/2016: - Left ventricle: The cavity size was normal. Systolic function was    normal. The estimated ejection fraction was in the range of 60%    to 65%. Wall motion was normal; there were no regional wall    motion abnormalities. Doppler parameters are consistent with    abnormal left ventricular relaxation (grade 1 diastolic    dysfunction). Doppler parameters are consistent with    indeterminate ventricular filling pressure.  - Aortic valve: Transvalvular velocity was within the normal range.    There was no stenosis. There was no regurgitation.  - Mitral valve: Transvalvular velocity  was within the normal range.    There was no evidence for stenosis. There was trivial    regurgitation.  - Right ventricle: The cavity size was normal. Wall thickness was    normal. Systolic function was normal.  - Tricuspid valve: There was no regurgitation.  - Pulmonary arteries: Systolic pressure was within the normal    range.    Zannie Cove [facility]/Anesthesiology Phone [phone] 02/10/2022 12:28 PM

## 2022-02-10 NOTE — Anesthesia Preprocedure Evaluation (Signed)
Anesthesia Evaluation  Patient identified by MRN, date of birth, ID band Patient awake    Reviewed: Allergy & Precautions, NPO status , Patient's Chart, lab work & pertinent test results  Airway Mallampati: II       Dental  (+) Dental Advisory Given   Pulmonary former smoker   breath sounds clear to auscultation       Cardiovascular hypertension, Pt. on home beta blockers (-) angina + CAD and + Cardiac Stents  (-) DOE  Rhythm:Regular Rate:Normal     Neuro/Psych negative neurological ROS     GI/Hepatic Neg liver ROS,GERD  ,,  Endo/Other  negative endocrine ROS    Renal/GU negative Renal ROS     Musculoskeletal   Abdominal   Peds  Hematology negative hematology ROS (+)   Anesthesia Other Findings   Reproductive/Obstetrics                              Lab Results  Component Value Date   WBC 4.5 02/13/2022   HGB 12.6 02/13/2022   HCT 36.9 02/13/2022   MCV 91.1 02/13/2022   PLT 318 02/13/2022   Lab Results  Component Value Date   CREATININE 0.95 02/13/2022   BUN 12 02/13/2022   NA 143 02/13/2022   K 3.5 02/13/2022   CL 108 02/13/2022   CO2 25 02/13/2022    Anesthesia Physical Anesthesia Plan  ASA: 3  Anesthesia Plan: General   Post-op Pain Management: Tylenol PO (pre-op)*   Induction: Intravenous  PONV Risk Score and Plan: 3 and Dexamethasone, Ondansetron, Treatment may vary due to age or medical condition and Midazolam  Airway Management Planned: Oral ETT  Additional Equipment: Arterial line  Intra-op Plan:   Post-operative Plan: Extubation in OR  Informed Consent: I have reviewed the patients History and Physical, chart, labs and discussed the procedure including the risks, benefits and alternatives for the proposed anesthesia with the patient or authorized representative who has indicated his/her understanding and acceptance.     Dental advisory given  Plan  Discussed with: CRNA  Anesthesia Plan Comments: (PAT note by Antionette Poles, PA-C: Follows with cardiology for CAD s/p MI 2006 treated with overlapping BMS to single-vessel disease in the RCA, SVT (improved on beta-blocker), HLD.  She had chest pain in July 2020, cath showed mild in-stent restenosis, patent PDA, normal LV function.  No intervention, recommended long-term DAPT.  Patient was last seen by Dr. Rennis Golden 06/07/2021 and noted to be doing well, no changes made to management, recommended annual follow-up.  Follows with neurology for history of right-sided trigeminal neuralgia and left intradural ICA aneurysm.  ICA aneurysm was recently discovered incidentally the patient was referred to IR for further evaluation.  Patient will need day of surgery labs and evaluation.  EKG 06/07/2021: Sinus bradycardia.  Rate 59.  Cannot rule out inferior infarct, age undetermined.  Cath 10/03/2018: ? Mid RCA to Dist RCA lesion is 30% stenosed. ? Dist RCA lesion is 45% stenosed. ? Previously placed RPAV stent (unknown type) is widely patent. ? The left ventricular systolic function is normal. ? LV end diastolic pressure is normal. ? The left ventricular ejection fraction is 55-65% by visual estimate.   1. Nonobstructive CAD involving the distal RCA with diffuse nonobstructive in stent restenosis. The PDA is now patent 2. Normal LV function 3. Mildly elevated LVEDP  TTE 08/08/2016: - Left ventricle: The cavity size was normal. Systolic function was  normal. The estimated ejection fraction was in the range of 60%    to 65%. Wall motion was normal; there were no regional wall    motion abnormalities. Doppler parameters are consistent with    abnormal left ventricular relaxation (grade 1 diastolic    dysfunction). Doppler parameters are consistent with    indeterminate ventricular filling pressure.  - Aortic valve: Transvalvular velocity was within the normal range.    There was no stenosis. There was no  regurgitation.  - Mitral valve: Transvalvular velocity was within the normal range.    There was no evidence for stenosis. There was trivial    regurgitation.  - Right ventricle: The cavity size was normal. Wall thickness was    normal. Systolic function was normal.  - Tricuspid valve: There was no regurgitation.  - Pulmonary arteries: Systolic pressure was within the normal    range.    )         Anesthesia Quick Evaluation

## 2022-02-10 NOTE — Progress Notes (Signed)
PCP - Dr Blair Heys Cardiologist - Dr Zoila Shutter  Chest x-ray - n/a EKG - 06/07/21 Stress Test - 10/07/10 ECHO - 08/08/16 Cardiac Cath - 10/03/18  ICD Pacemaker/Loop - n/a  Sleep Study -  n/a CPAP - none  Blood Thinner/Aspirin Instructions: Follow your surgeon's instructions on when to stop ASA & Plavix prior to surgery,  If no instructions were given by your surgeon then you will need to call the office for those instructions.  Anesthesia review: Yes  STOP now taking any Aspirin (unless otherwise instructed by your surgeon), Aleve, Naproxen, Ibuprofen, Motrin, Advil, Goody's, BC's, all herbal medications, fish oil, and all vitamins.   Coronavirus Screening Do you have any of the following symptoms:  Cough yes/no: No Fever (>100.48F)  yes/no: No Runny nose yes/no: No Sore throat yes/no: No Difficulty breathing/shortness of breath  yes/no: No  Have you traveled in the last 14 days and where? yes/no: No  Patient verbalized understanding of instructions that were given via phone.

## 2022-02-13 ENCOUNTER — Observation Stay (HOSPITAL_COMMUNITY): Payer: 59 | Admitting: Physician Assistant

## 2022-02-13 ENCOUNTER — Inpatient Hospital Stay (HOSPITAL_COMMUNITY)
Admission: AD | Admit: 2022-02-13 | Discharge: 2022-02-14 | DRG: 039 | Disposition: A | Discharge status: Home / Self Care | Payer: 59 | Attending: Interventional Radiology | Admitting: Interventional Radiology

## 2022-02-13 ENCOUNTER — Observation Stay (HOSPITAL_COMMUNITY)
Admission: RE | Admit: 2022-02-13 | Discharge: 2022-02-13 | Disposition: A | Discharge status: Home / Self Care | Payer: 59 | Source: Ambulatory Visit | Attending: Interventional Radiology | Admitting: Interventional Radiology

## 2022-02-13 ENCOUNTER — Encounter (HOSPITAL_COMMUNITY)
Admission: AD | Disposition: A | Discharge status: Home / Self Care | Payer: Self-pay | Source: Home / Self Care | Attending: Interventional Radiology

## 2022-02-13 DIAGNOSIS — I10 Essential (primary) hypertension: Secondary | ICD-10-CM

## 2022-02-13 DIAGNOSIS — I2510 Atherosclerotic heart disease of native coronary artery without angina pectoris: Secondary | ICD-10-CM

## 2022-02-13 DIAGNOSIS — I725 Aneurysm of other precerebral arteries: Secondary | ICD-10-CM | POA: Diagnosis not present

## 2022-02-13 DIAGNOSIS — Z7982 Long term (current) use of aspirin: Secondary | ICD-10-CM

## 2022-02-13 DIAGNOSIS — Z20822 Contact with and (suspected) exposure to covid-19: Secondary | ICD-10-CM | POA: Diagnosis present

## 2022-02-13 DIAGNOSIS — K219 Gastro-esophageal reflux disease without esophagitis: Secondary | ICD-10-CM | POA: Diagnosis present

## 2022-02-13 DIAGNOSIS — F32A Depression, unspecified: Secondary | ICD-10-CM | POA: Insufficient documentation

## 2022-02-13 DIAGNOSIS — Z87891 Personal history of nicotine dependence: Secondary | ICD-10-CM | POA: Insufficient documentation

## 2022-02-13 DIAGNOSIS — I671 Cerebral aneurysm, nonruptured: Secondary | ICD-10-CM | POA: Insufficient documentation

## 2022-02-13 DIAGNOSIS — F419 Anxiety disorder, unspecified: Secondary | ICD-10-CM | POA: Insufficient documentation

## 2022-02-13 DIAGNOSIS — Z90710 Acquired absence of both cervix and uterus: Secondary | ICD-10-CM

## 2022-02-13 DIAGNOSIS — Z8249 Family history of ischemic heart disease and other diseases of the circulatory system: Secondary | ICD-10-CM

## 2022-02-13 DIAGNOSIS — E785 Hyperlipidemia, unspecified: Secondary | ICD-10-CM | POA: Diagnosis not present

## 2022-02-13 DIAGNOSIS — I252 Old myocardial infarction: Secondary | ICD-10-CM | POA: Insufficient documentation

## 2022-02-13 DIAGNOSIS — Z79899 Other long term (current) drug therapy: Secondary | ICD-10-CM | POA: Diagnosis not present

## 2022-02-13 DIAGNOSIS — Z809 Family history of malignant neoplasm, unspecified: Secondary | ICD-10-CM

## 2022-02-13 HISTORY — PX: IR TRANSCATH/EMBOLIZ: IMG695

## 2022-02-13 HISTORY — PX: IR US GUIDE VASC ACCESS RIGHT: IMG2390

## 2022-02-13 HISTORY — PX: IR 3D INDEPENDENT WKST: IMG2385

## 2022-02-13 HISTORY — PX: RADIOLOGY WITH ANESTHESIA: SHX6223

## 2022-02-13 HISTORY — PX: IR ANGIO INTRA EXTRACRAN SEL INTERNAL CAROTID UNI L MOD SED: IMG5361

## 2022-02-13 HISTORY — PX: IR ANGIO INTRA EXTRACRAN SEL COM CAROTID INNOMINATE UNI R MOD SED: IMG5359

## 2022-02-13 HISTORY — PX: IR ANGIO VERTEBRAL SEL VERTEBRAL UNI R MOD SED: IMG5368

## 2022-02-13 LAB — CBC WITH DIFFERENTIAL/PLATELET
Abs Immature Granulocytes: 0.01 10*3/uL (ref 0.00–0.07)
Basophils Absolute: 0.1 10*3/uL (ref 0.0–0.1)
Basophils Relative: 1 %
Eosinophils Absolute: 0.2 10*3/uL (ref 0.0–0.5)
Eosinophils Relative: 3 %
HCT: 36.9 % (ref 36.0–46.0)
Hemoglobin: 12.6 g/dL (ref 12.0–15.0)
Immature Granulocytes: 0 %
Lymphocytes Relative: 33 %
Lymphs Abs: 1.5 10*3/uL (ref 0.7–4.0)
MCH: 31.1 pg (ref 26.0–34.0)
MCHC: 34.1 g/dL (ref 30.0–36.0)
MCV: 91.1 fL (ref 80.0–100.0)
Monocytes Absolute: 0.3 10*3/uL (ref 0.1–1.0)
Monocytes Relative: 7 %
Neutro Abs: 2.4 10*3/uL (ref 1.7–7.7)
Neutrophils Relative %: 56 %
Platelets: 318 10*3/uL (ref 150–400)
RBC: 4.05 MIL/uL (ref 3.87–5.11)
RDW: 12.6 % (ref 11.5–15.5)
WBC: 4.5 10*3/uL (ref 4.0–10.5)
nRBC: 0 % (ref 0.0–0.2)

## 2022-02-13 LAB — HEPARIN LEVEL (UNFRACTIONATED): Heparin Unfractionated: <0.1 IU/mL — ABNORMAL LOW (ref 0.30–0.70)

## 2022-02-13 LAB — BASIC METABOLIC PANEL
Anion gap: 10 (ref 5–15)
BUN: 12 mg/dL (ref 6–20)
CO2: 25 mmol/L (ref 22–32)
Calcium: 9.2 mg/dL (ref 8.9–10.3)
Chloride: 108 mmol/L (ref 98–111)
Creatinine, Ser: 0.95 mg/dL (ref 0.44–1.00)
GFR, Estimated: >60 mL/min (ref >60)
Glucose, Bld: 106 mg/dL — ABNORMAL HIGH (ref 70–99)
Potassium: 3.5 mmol/L (ref 3.5–5.1)
Sodium: 143 mmol/L (ref 135–145)

## 2022-02-13 LAB — PROTIME-INR
INR: 0.9 (ref 0.8–1.2)
Prothrombin Time: 12.3 seconds (ref 11.4–15.2)

## 2022-02-13 LAB — MRSA NEXT GEN BY PCR, NASAL: MRSA by PCR Next Gen: NOT DETECTED

## 2022-02-13 SURGERY — IR WITH ANESTHESIA
Anesthesia: General

## 2022-02-13 MED ORDER — SODIUM CHLORIDE 0.9 % IV SOLN: INTRAVENOUS | Status: DC

## 2022-02-13 MED ORDER — HEPARIN SODIUM (PORCINE) 1000 UNIT/ML IJ SOLN
INTRAMUSCULAR | Status: AC | PRN
  Administered 2022-02-13: 2000 [IU] via INTRAVENOUS

## 2022-02-13 MED ORDER — NITROGLYCERIN 1 MG/10 ML FOR IR/CATH LAB
INTRA_ARTERIAL | Status: AC | PRN
  Administered 2022-02-13 (×2): 200 ug via INTRA_ARTERIAL

## 2022-02-13 MED ORDER — IOHEXOL 300 MG/ML  SOLN
100.0000 mL | Freq: Once | INTRAMUSCULAR | Status: AC | PRN
  Administered 2022-02-13: 60 mL via INTRA_ARTERIAL

## 2022-02-13 MED ORDER — ROCURONIUM BROMIDE 10 MG/ML (PF) SYRINGE
PREFILLED_SYRINGE | INTRAVENOUS | Status: DC | PRN
  Administered 2022-02-13: 60 mg via INTRAVENOUS
  Administered 2022-02-13: 40 mg via INTRAVENOUS

## 2022-02-13 MED ORDER — CLOPIDOGREL BISULFATE 75 MG PO TABS
75.0000 mg | ORAL_TABLET | ORAL | Status: AC
  Administered 2022-02-13: 75 mg via ORAL
  Filled 2022-02-13 (×2): qty 1

## 2022-02-13 MED ORDER — VERAPAMIL HCL 2.5 MG/ML IV SOLN
INTRAVENOUS | Status: AC
  Filled 2022-02-13: qty 2

## 2022-02-13 MED ORDER — VANCOMYCIN HCL 1000 MG IV SOLR
1000.0000 mg | INTRAVENOUS | Status: DC
  Filled 2022-02-13: qty 20

## 2022-02-13 MED ORDER — HEPARIN SODIUM (PORCINE) 1000 UNIT/ML IJ SOLN
INTRAMUSCULAR | Status: AC
  Filled 2022-02-13: qty 10

## 2022-02-13 MED ORDER — ASPIRIN 81 MG PO CHEW
81.0000 mg | CHEWABLE_TABLET | Freq: Every day | ORAL | Status: DC
  Administered 2022-02-14: 81 mg via ORAL
  Filled 2022-02-13: qty 1

## 2022-02-13 MED ORDER — GLYCOPYRROLATE 0.2 MG/ML IJ SOLN
INTRAMUSCULAR | Status: DC | PRN
  Administered 2022-02-13 (×2): .1 mg via INTRAVENOUS

## 2022-02-13 MED ORDER — ACETAMINOPHEN 325 MG PO TABS
650.0000 mg | ORAL_TABLET | ORAL | Status: DC | PRN
  Administered 2022-02-13 – 2022-02-14 (×2): 650 mg via ORAL
  Filled 2022-02-13 (×2): qty 2

## 2022-02-13 MED ORDER — MIDAZOLAM HCL 2 MG/2ML IJ SOLN
INTRAMUSCULAR | Status: DC | PRN
  Administered 2022-02-13: 1 mg via INTRAVENOUS

## 2022-02-13 MED ORDER — EPTIFIBATIDE 20 MG/10ML IV SOLN
INTRAVENOUS | Status: AC | PRN
  Administered 2022-02-13 (×2): 1.5 mg via INTRAVENOUS

## 2022-02-13 MED ORDER — PROPOFOL 10 MG/ML IV BOLUS
INTRAVENOUS | Status: DC | PRN
  Administered 2022-02-13: 200 mg via INTRAVENOUS

## 2022-02-13 MED ORDER — HEPARIN (PORCINE) 25000 UT/250ML-% IV SOLN: 600.0000 [IU]/h | INTRAVENOUS | Status: DC

## 2022-02-13 MED ORDER — IOHEXOL 300 MG/ML  SOLN
100.0000 mL | Freq: Once | INTRAMUSCULAR | Status: AC | PRN
  Administered 2022-02-13: 50 mL via INTRA_ARTERIAL

## 2022-02-13 MED ORDER — CLOPIDOGREL BISULFATE 75 MG PO TABS: 75.0000 mg | ORAL_TABLET | Freq: Every day | ORAL | Status: DC

## 2022-02-13 MED ORDER — LIDOCAINE HCL 1 % IJ SOLN
INTRAMUSCULAR | Status: AC
  Filled 2022-02-13: qty 20

## 2022-02-13 MED ORDER — NIMODIPINE 30 MG PO CAPS
0.0000 mg | ORAL_CAPSULE | ORAL | Status: AC
  Filled 2022-02-13: qty 1
  Filled 2022-02-13: qty 2

## 2022-02-13 MED ORDER — CLEVIDIPINE BUTYRATE 0.5 MG/ML IV EMUL
INTRAVENOUS | Status: AC
  Filled 2022-02-13: qty 50

## 2022-02-13 MED ORDER — AMISULPRIDE (ANTIEMETIC) 5 MG/2ML IV SOLN: 10.0000 mg | Freq: Once | INTRAVENOUS | Status: DC | PRN

## 2022-02-13 MED ORDER — SUGAMMADEX SODIUM 200 MG/2ML IV SOLN
INTRAVENOUS | Status: DC | PRN
  Administered 2022-02-13: 200 mg via INTRAVENOUS

## 2022-02-13 MED ORDER — LACTATED RINGERS IV SOLN: INTRAVENOUS | Status: DC | PRN

## 2022-02-13 MED ORDER — HEPARIN (PORCINE) 25000 UT/250ML-% IV SOLN
INTRAVENOUS | Status: AC
  Filled 2022-02-13: qty 250

## 2022-02-13 MED ORDER — CHLORHEXIDINE GLUCONATE CLOTH 2 % EX PADS
6.0000 | MEDICATED_PAD | Freq: Every day | CUTANEOUS | Status: DC
  Administered 2022-02-14: 6 via TOPICAL

## 2022-02-13 MED ORDER — FENTANYL CITRATE (PF) 250 MCG/5ML IJ SOLN
INTRAMUSCULAR | Status: DC | PRN
  Administered 2022-02-13: 100 ug via INTRAVENOUS

## 2022-02-13 MED ORDER — DEXAMETHASONE SODIUM PHOSPHATE 10 MG/ML IJ SOLN
INTRAMUSCULAR | Status: DC | PRN
  Administered 2022-02-13: 10 mg via INTRAVENOUS

## 2022-02-13 MED ORDER — LIDOCAINE 2% (20 MG/ML) 5 ML SYRINGE
INTRAMUSCULAR | Status: DC | PRN
  Administered 2022-02-13: 60 mg via INTRAVENOUS

## 2022-02-13 MED ORDER — VERAPAMIL HCL 2.5 MG/ML IV SOLN
INTRAVENOUS | Status: AC | PRN
  Administered 2022-02-13: 2.5 mg via INTRA_ARTERIAL

## 2022-02-13 MED ORDER — FENTANYL CITRATE (PF) 100 MCG/2ML IJ SOLN: 25.0000 ug | INTRAMUSCULAR | Status: DC | PRN

## 2022-02-13 MED ORDER — ASPIRIN 325 MG PO TBEC
325.0000 mg | DELAYED_RELEASE_TABLET | ORAL | Status: AC
  Administered 2022-02-13: 325 mg via ORAL
  Filled 2022-02-13 (×2): qty 1

## 2022-02-13 MED ORDER — ONDANSETRON HCL 4 MG/2ML IJ SOLN
INTRAMUSCULAR | Status: DC | PRN
  Administered 2022-02-13: 4 mg via INTRAVENOUS

## 2022-02-13 MED ORDER — ORAL CARE MOUTH RINSE: 15.0000 mL | Freq: Once | OROMUCOSAL | Status: AC

## 2022-02-13 MED ORDER — HEPARIN (PORCINE) 25000 UT/250ML-% IV SOLN
500.0000 [IU]/h | INTRAVENOUS | Status: DC
  Administered 2022-02-13: 500 [IU]/h via INTRAVENOUS
  Filled 2022-02-13: qty 250

## 2022-02-13 MED ORDER — CHLORHEXIDINE GLUCONATE 0.12 % MT SOLN
15.0000 mL | Freq: Once | OROMUCOSAL | Status: AC
  Administered 2022-02-13: 15 mL via OROMUCOSAL
  Filled 2022-02-13: qty 15

## 2022-02-13 MED ORDER — CLOPIDOGREL BISULFATE 75 MG PO TABS
75.0000 mg | ORAL_TABLET | Freq: Every day | ORAL | Status: DC
  Administered 2022-02-14: 75 mg via ORAL
  Filled 2022-02-13: qty 1

## 2022-02-13 MED ORDER — ACETAMINOPHEN 160 MG/5ML PO SOLN: 650.0000 mg | ORAL | Status: DC | PRN

## 2022-02-13 MED ORDER — ACETAMINOPHEN 500 MG PO TABS
1000.0000 mg | ORAL_TABLET | Freq: Once | ORAL | Status: AC
  Administered 2022-02-13: 1000 mg via ORAL
  Filled 2022-02-13: qty 2

## 2022-02-13 MED ORDER — PROTAMINE SULFATE 10 MG/ML IV SOLN
INTRAVENOUS | Status: DC | PRN
  Administered 2022-02-13: 5 mg via INTRAVENOUS

## 2022-02-13 MED ORDER — NITROGLYCERIN 1 MG/10 ML FOR IR/CATH LAB
INTRA_ARTERIAL | Status: AC
  Filled 2022-02-13: qty 10

## 2022-02-13 MED ORDER — VANCOMYCIN HCL IN DEXTROSE 1-5 GM/200ML-% IV SOLN
1000.0000 mg | INTRAVENOUS | Status: AC
  Administered 2022-02-13: 1000 mg via INTRAVENOUS
  Filled 2022-02-13: qty 200

## 2022-02-13 MED ORDER — EPTIFIBATIDE 20 MG/10ML IV SOLN
INTRAVENOUS | Status: AC
  Filled 2022-02-13: qty 10

## 2022-02-13 MED ORDER — PHENYLEPHRINE HCL-NACL 20-0.9 MG/250ML-% IV SOLN
INTRAVENOUS | Status: DC | PRN
  Administered 2022-02-13: 35 ug/min via INTRAVENOUS

## 2022-02-13 MED ORDER — HEPARIN (PORCINE) 25000 UT/250ML-% IV SOLN: 800.0000 [IU]/h | INTRAVENOUS | Status: AC

## 2022-02-13 MED ORDER — CLEVIDIPINE BUTYRATE 0.5 MG/ML IV EMUL
0.0000 mg/h | INTRAVENOUS | Status: AC
  Administered 2022-02-13: 2 mg/h via INTRAVENOUS
  Administered 2022-02-13: 4 mg/h via INTRAVENOUS
  Filled 2022-02-13 (×2): qty 50

## 2022-02-13 MED ORDER — HEPARIN SODIUM (PORCINE) 1000 UNIT/ML IJ SOLN
INTRAMUSCULAR | Status: DC | PRN
  Administered 2022-02-13 (×2): 1000 [IU] via INTRAVENOUS

## 2022-02-13 MED ORDER — NIMODIPINE 30 MG PO CAPS
ORAL_CAPSULE | ORAL | Status: AC
  Administered 2022-02-13: 30 mg via ORAL
  Filled 2022-02-13: qty 1

## 2022-02-13 MED ORDER — ASPIRIN 81 MG PO CHEW: 81.0000 mg | CHEWABLE_TABLET | Freq: Every day | ORAL | Status: DC

## 2022-02-13 MED ORDER — ACETAMINOPHEN 650 MG RE SUPP: 650.0000 mg | RECTAL | Status: DC | PRN

## 2022-02-13 NOTE — Progress Notes (Signed)
   02/13/22 2200  Provider Notification  Provider Name/Title MD devesjwar  Date Provider Notified 02/13/22  Time Provider Notified 2210  Method of Notification Page  Notification Reason  (BP goal)  Provider response  ("BP goal 110s-140s is fine" per deveshwar)  Date of Provider Response 02/13/22  Time of Provider Response 2215

## 2022-02-13 NOTE — Anesthesia Procedure Notes (Signed)
Arterial Line Insertion Start/End11/13/2023 7:56 AM, 02/13/2022 7:56 AM Performed by: Dorie Rank, CRNA  Preanesthetic checklist: patient identified, IV checked, site marked, risks and benefits discussed, surgical consent, monitors and equipment checked, pre-op evaluation and timeout performed Lidocaine 1% used for infiltration Left, radial was placed Catheter size: 20 G Hand hygiene performed , maximum sterile barriers used  and Seldinger technique used Allen's test indicative of satisfactory collateral circulation Attempts: 1 Procedure performed without using ultrasound guided technique. Following insertion, Biopatch and dressing applied. Patient tolerated the procedure well with no immediate complications.

## 2022-02-13 NOTE — Procedures (Signed)
INR.   Status post right vertebral artery and bilateral common carotid arteriograms.   Right radial approach.   Findings approximately 5 mm lobulated left paraophthalmic region aneurysm. Status post endovascular embolization of lobulated wide neck right paraophthalmic region aneurysm using a 4.25 x 14 pipeline Shield flow diverter device. Post CT brain no evidence of hemorrhagic complications.  Wrist band applied at the right radial puncture site for hemostasis.  Distal right radial pulse present. Patient extubated. Denies any headaches nausea vomiting.  Pupils 3 mm bilaterally reactive.  No facial asymmetry.  Tongue midline.  Moves all 4 extremities equally. Fatima Sanger MD.

## 2022-02-13 NOTE — Progress Notes (Signed)
ANTICOAGULATION CONSULT NOTE - Initial Consult  Pharmacy Consult:  Heparin Indication: Post interventional neuroradiology procedure  Allergies  Allergen Reactions   Crestor [Rosuvastatin]     Myalgia    Lovastatin Other (See Comments)    Myalgias    Niaspan [Niacin Er] Itching   Penicillins Other (See Comments)    Child hood allergy Has patient had a PCN reaction causing immediate rash, facial/tongue/throat swelling, SOB or lightheadedness with hypotension: No  Has patient had a PCN reaction causing severe rash involving mucus membranes or skin necrosis: No Has patient had a PCN reaction that required hospitalization: No Has patient had a PCN reaction occurring within the last 10 years: No If all of the above answers are "NO", then may proceed with Cephalosporin use.   Pitavastatin Other (See Comments)    Joints ache   Zocor [Simvastatin] Other (See Comments)    Myalgias     Patient Measurements: Height: 5\' 2"  (157.5 cm) Weight: 90.7 kg (200 lb) IBW/kg (Calculated) : 50.1 Heparin Dosing Weight: 71 kg  Vital Signs: Temp: 97.9 F (36.6 C) (11/13 2000) Temp Source: Oral (11/13 2000) BP: 110/54 (11/13 2100) Pulse Rate: 81 (11/13 2100)  Labs: Recent Labs    02/13/22 0640 02/13/22 2043  HGB 12.6  --   HCT 36.9  --   PLT 318  --   LABPROT 12.3  --   INR 0.9  --   HEPARINUNFRC  --  <0.10*  CREATININE 0.95  --      Estimated Creatinine Clearance: 67.6 mL/min (by C-G formula based on SCr of 0.95 mg/dL).   Medical History: Past Medical History:  Diagnosis Date   Anxiety    CAD (coronary artery disease)    Cardiac murmur    Depression    Gallstones    GERD (gastroesophageal reflux disease)    no current problems per patient 02/10/22   History of echocardiogram    Echo 5/18: EF 60-65, and normal wall motion, grade 1 diastolic dysfunction   History of PSVT (paroxysmal supraventricular tachycardia) 2018   monitor    History of smoking    Hyperlipidemia     Obesity    Old MI (myocardial infarction) 2006   Pre-syncope 2018     Assessment: 12 YOF s/p interventional neuroradiology procedure to continue on IV heparin.  Baseline labs reviewed.  Heparin level came back undetectable. Increase rate and check AM level with same stop time.  Goal of Therapy:  Heparin level 0.1-0.25 units/ml Monitor platelets by anticoagulation protocol: Yes   Plan:  Increase heparin gtt to 800 units/hr Check AM hr HL  05-24-1976, PharmD, [place], AAHIVP, CPP Infectious Disease Pharmacist 02/13/2022 9:28 PM

## 2022-02-13 NOTE — Transfer of Care (Signed)
Immediate Anesthesia Transfer of Care Note  Patient: Colleen Dillon  Procedure(s) Performed: aneurysm embolization  Patient Location: PACU  Anesthesia Type:General  Level of Consciousness: awake, alert , and oriented  Airway & Oxygen Therapy: Patient connected to face mask oxygen  Post-op Assessment: Post -op Vital signs reviewed and stable  Post vital signs: stable  Last Vitals:  Vitals Value Taken Time  BP 128/71 02/13/22 1115  Temp    Pulse 63 02/13/22 1117  Resp 13 02/13/22 1117  SpO2 100 % 02/13/22 1117  Vitals shown include unvalidated device data.  Last Pain:  Vitals:   02/13/22 0658  PainSc: 0-No pain         Complications: No notable events documented.

## 2022-02-13 NOTE — Progress Notes (Signed)
ANTICOAGULATION CONSULT NOTE - Initial Consult  Pharmacy Consult:  Heparin Indication: Post interventional neuroradiology procedure  Allergies  Allergen Reactions   Crestor [Rosuvastatin]     Myalgia    Lovastatin Other (See Comments)    Myalgias    Niaspan [Niacin Er] Itching   Penicillins Other (See Comments)    Child hood allergy Has patient had a PCN reaction causing immediate rash, facial/tongue/throat swelling, SOB or lightheadedness with hypotension: No  Has patient had a PCN reaction causing severe rash involving mucus membranes or skin necrosis: No Has patient had a PCN reaction that required hospitalization: No Has patient had a PCN reaction occurring within the last 10 years: No If all of the above answers are "NO", then may proceed with Cephalosporin use.   Pitavastatin Other (See Comments)    Joints ache   Zocor [Simvastatin] Other (See Comments)    Myalgias     Patient Measurements: Height: 5\' 2"  (157.5 cm) Weight: 90.7 kg (200 lb) IBW/kg (Calculated) : 50.1 Heparin Dosing Weight: 71 kg  Vital Signs: Temp: 97.5 F (36.4 C) (11/13 1200) BP: 156/88 (11/13 1200) Pulse Rate: 56 (11/13 1200)  Labs: Recent Labs    02/13/22 0640  HGB 12.6  HCT 36.9  PLT 318  LABPROT 12.3  INR 0.9  CREATININE 0.95    Estimated Creatinine Clearance: 67.6 mL/min (by C-G formula based on SCr of 0.95 mg/dL).   Medical History: Past Medical History:  Diagnosis Date   Anxiety    CAD (coronary artery disease)    Cardiac murmur    Depression    Gallstones    GERD (gastroesophageal reflux disease)    no current problems per patient 02/10/22   History of echocardiogram    Echo 5/18: EF Colleen-65, and normal wall motion, grade 1 diastolic dysfunction   History of PSVT (paroxysmal supraventricular tachycardia) 2018   monitor    History of smoking    Hyperlipidemia    Obesity    Old MI (myocardial infarction) 2006   Pre-syncope 2018     Assessment: Colleen Dillon s/p  interventional neuroradiology procedure to continue on IV heparin.  Baseline labs reviewed.  Goal of Therapy:  Heparin level 0.1-0.25 units/ml Monitor platelets by anticoagulation protocol: Yes   Plan:  Increase heparin gtt to 600 units/hr Check 6 hr HL  Caira Poche D. 05-10-1984, PharmD, BCPS, BCCCP 02/13/2022, 12:46 PM

## 2022-02-13 NOTE — Progress Notes (Signed)
  The note originally documented on this encounter has been moved the the encounter in which it belongs.  

## 2022-02-13 NOTE — H&P (Signed)
Chief Complaint: Patient was seen in consultation today for brain aneurysm  Referring Physician(s): Dr. [name]  Supervising Physician: Luanne Bras  Patient Status: [facility] - Out-pt  History of Present Illness: VEORA FONTE is a 56 y.o. female with a medical history significant for anxiety/depression, CAD/MI, obesity and right-sided trigeminal neuralgia. She follows with Dr. Jaffe/Neurology. During the work up for trigeminal neuralgia an MRI/MRA of the brain 01/30/21 revealed a left para ophthalmic aneurysm measuring approximately 5 mm. Repeat imaging 11/14/21 showed the aneurysm to be stable.  The patient has a family history of brain aneurysms with possible dissection (mother). The patient was referred to Neuro Interventional Radiology for further evaluation of the brain aneurysm. She met with Dr. [name] 01/26/22 and they discussed the nature of brain aneurysms, risks/benefits of treatment versus conservative management. The patient elected to pursue treatment. She is aware this procedure will be performed under general anesthesia with planned overnight observation in the Neuro ICU.    Past Medical History:  Diagnosis Date   Anxiety    CAD (coronary artery disease)    Cardiac murmur    Depression    Gallstones    GERD (gastroesophageal reflux disease)    no current problems per patient 02/10/22   History of echocardiogram    Echo 5/18: EF 60-65, and normal wall motion, grade 1 diastolic dysfunction   History of PSVT (paroxysmal supraventricular tachycardia) 2018   monitor    History of smoking    Hyperlipidemia    Obesity    Old MI (myocardial infarction) 2006   Pre-syncope 2018    Past Surgical History:  Procedure Laterality Date   ABDOMINAL HYSTERECTOMY  2002   CARDIAC CATHETERIZATION  05/18/2004   90% RCA stenosis, stented w/ a [employer] Liberte 5.0x63m bare metal stent - postdilated at 10atmx10sec, stented w/ a second [id number] bare metal stent at 15atmx30sec, resulting in TIMI III flow.   CARDIAC CATHETERIZATION  09/08/2004   No intervention - continue medical therapy   CARDIAC CATHETERIZATION  03/21/2005   Distal RCA 50-60% in-stent restenosis, stented w/ a DES Scimed Taxus Express II [id number] - postdilated at 20atmx31sec, resulting in TIMI III flow-60% stenosis reduced to 0-10%   CARDIAC CATHETERIZATION  05/23/2005   No intervention - continue medical therapy   CARDIAC CATHETERIZATION  11/02/2008   No intervention - continue medical therapy   CARDIAC SURGERY  2006   stent placement   CARDIOVASCULAR STRESS TEST  10/02/2012   No significant ST segment change suggestive of ischemia   CESAREAN SECTION  1987   CHOLECYSTECTOMY N/A 08/31/2017   Procedure: LAPAROSCOPIC CHOLECYSTECTOMY WITH INTRAOPERATIVE CHOLANGIOGRAM;  Surgeon: RamRalene OkD;  [facility];  Laterality: N/A;   COLONOSCOPY     IR RADIOLOGIST EVAL & MGMT  01/27/2022   LEFT HEART CATH AND CORONARY ANGIOGRAPHY N/A 10/03/2018   Procedure: LEFT HEART CATH AND CORONARY ANGIOGRAPHY;  Surgeon: JorMartiniqueeter M, MD;  Location: [facility];  Service: Cardiovascular;  Laterality: N/A;   TEAR DUCT PROBING Left 05/02/2017   Procedure: NASAL LACRIMAL DUCT EXPLORATION WITH LACRICATH BALLOON CANALICULOPLASTY LEFT EYE;  Surgeon: SpeGevena CottonD;  [facility];  Laterality: Left;   TRANSTHORACIC ECHOCARDIOGRAM  07/30/2012   EF 50-55%, moderate mitral regurg   TUBAL LIGATION      Allergies: Crestor [rosuvastatin], Lovastatin, Niaspan [niacin er], Penicillins, Pitavastatin, and Zocor [simvastatin]  Medications: Prior to Admission medications  Medication Sig Start Date End Date Taking? Authorizing Provider  acetaminophen (TYLENOL) 650 MG CR tablet Take 650-1,300 mg by mouth every 8 (eight) hours as needed for pain.    [provider]  acyclovir (ZOVIRAX) 200 MG capsule Take  3 capsules now then 1 capsule 5 times a day Patient taking differently: Take 200 mg by mouth daily as needed (fever blisters). 04/05/21     aspirin EC 81 MG tablet Take 81 mg by mouth every morning.     [provider]  benzonatate (TESSALON PERLES) 100 MG capsule Take 2 capsules (200 mg total) by mouth 3 (three) times daily for cough Patient not taking: Reported on 02/07/2022 07/21/21     buPROPion (WELLBUTRIN XL) 300 MG 24 hr tablet Take 1 tablet by mouth in the morning daily 07/11/21     clopidogrel (PLAVIX) 75 MG tablet TAKE 1 TABLET BY MOUTH EVERY MORNING 02/04/21 02/13/22  Hilty, Nadean Corwin, MD  Evolocumab (REPATHA SURECLICK) 700 MG/ML SOAJ Inject 1 dose into the skin every 14 days. 08/03/21   Hilty, Nadean Corwin, MD  metoprolol tartrate (LOPRESSOR) 25 MG tablet TAKE 1/2 TABLET BY MOUTH TWICE DAILY 06/13/21 06/13/22  Hilty, Nadean Corwin, MD  nitroGLYCERIN (NITROSTAT) 0.4 MG SL tablet Place 1 tablet under the tongue every 5 minutes as needed for chest pain. 08/03/21   Hilty, Nadean Corwin, MD  Oxcarbazepine (TRILEPTAL) 300 MG tablet Take one tablet by mouth twice daily 10/18/21   Pieter Partridge, DO     Family History  Problem Relation Age of Onset   Parkinsonism Mother    Aneurysm Mother 70       Dissected   Hypertension Mother 45   Arthritis Mother 21       Rheumatoid arthritis   Ulcers Father 64       Peptic ulcers   Cancer Father 28   Hypertension Brother 84   Hypertension Brother 58   Heart disease Maternal Grandfather 70    Social History   Socioeconomic History   Marital status: Married    Spouse name: Not on file   Number of children: Not on file   Years of education: Not on file   Highest education level: Not on file  Occupational History   Not on file  Tobacco Use   Smoking status: Former    Types: Cigarettes   Smokeless tobacco: Never   Tobacco comments:    quit smoking cigarettes > 40 years ago  Vaping Use   Vaping Use: Never used  Substance and Sexual Activity    Alcohol use: No   Drug use: No   Sexual activity: Not on file    Comment: Tubal ligation  Other Topics Concern   Not on file  Social History Narrative   Right handed   Social Determinants of Health   Financial Resource Strain: Not on file  Food Insecurity: Not on file  Transportation Needs: Not on file  Physical Activity: Not on file  Stress: Not on file  Social Connections: Not on file    Review of Systems: A 12 point ROS discussed and pertinent positives are indicated in the HPI above.  All other systems are negative.  Review of Systems  Constitutional:  Negative for appetite change and fatigue.  Respiratory:  Negative for cough and shortness of breath.   Cardiovascular:  Negative for chest pain and leg swelling.  Gastrointestinal:  Negative for abdominal pain, diarrhea, nausea and vomiting.  Neurological:  Negative for dizziness, weakness and  headaches.    Vital Signs: Temp: 98.3, BP 124/74, HR 57, RR 17, O2 sat 97% on Room Air  Physical Exam Constitutional:      General: She is not in acute distress.    Appearance: She is not ill-appearing.  HENT:     Mouth/Throat:     Mouth: Mucous membranes are moist.     Pharynx: Oropharynx is clear.  Cardiovascular:     Rate and Rhythm: Normal rate and regular rhythm.     Pulses: Normal pulses.     Heart sounds: Normal heart sounds.  Pulmonary:     Effort: Pulmonary effort is normal.     Breath sounds: Normal breath sounds.  Abdominal:     General: Bowel sounds are normal.     Palpations: Abdomen is soft.     Tenderness: There is no abdominal tenderness.  Musculoskeletal:     Right lower leg: No edema.     Left lower leg: No edema.  Skin:    General: Skin is warm and dry.  Neurological:     Mental Status: She is alert and oriented to person, place, and time.     Imaging: IR Radiologist Eval & Mgmt  Result Date: 01/27/2022 EXAM: NEW PATIENT OFFICE VISIT CHIEF COMPLAINT: Recent discovery of brain aneurysm.  Current Pain Level: 1-10 HISTORY OF PRESENT ILLNESS: 56 year old right handed lady who has been referred for evaluation of a recently discovered intracranial brain aneurysm. Patient has been referred by Dr. [name], her neurologist. The patient has been following her neurologist for right-sided trigeminal neuralgia. During the workup, an MRI MRA of the brain was performed. On the MRI MRA of the brain 11/14/2021, discovery was made of a left para ophthalmic region aneurysm measuring approximately 5 mm. This is felt to be unrelated to her right-sided facial pain. On close questioning, the patient remains asymptomatic in terms of unusual headaches, nausea, vomiting, visual aberrations, amaurosis fugax, diplopia, loss of awareness, altered mental status or of seizures. The family history reports aneurysm in her mother question due to dissection. Patient reports reasonable control of her right side facial trigeminal neuralgia. She continues with her daily living activities, and also working normally at work. Systemically, no history of pathological symptomatology pertaining to cardiovascular, respiratory, gastrointestinal, GU systems. Denies recent chills, fever or rigors. Medication * : Sig * : Dispense * : Refill . * : acetaminophen (TYLENOL) 325 MG tablet * : Take 325 mg by mouth as needed. * : * : . * : acyclovir (ZOVIRAX) 200 MG capsule * : Take 3 capsules now then 1 capsule 5 times a day * : 38 capsule * : 3 . * : aspirin EC 81 MG tablet * : Take 81 mg by mouth every morning. * : * : . * : benzonatate (TESSALON PERLES) 100 MG capsule * : Take 2 capsules (200 mg total) by mouth 3 (three) times daily for cough * : 60 capsule * : 0 . * : buPROPion (WELLBUTRIN XL) 300 MG 24 hr tablet * : Take 1 tablet by mouth in the morning daily * : 90 tablet * : 3 . * : clopidogrel (PLAVIX) 75 MG tablet * : TAKE 1 TABLET BY MOUTH EVERY MORNING * : 90 tablet * : 3 . * : COVID-19 At Home Antigen Test (CARESTART COVID-19 HOME TEST) KIT *  : Use as directed within package instructions (Patient not taking: Reported on 06/07/2021) * : 4 each * : 0 . * : Evolocumab (REPATHA  SURECLICK) 350 MG/ML SOAJ * : Inject 1 dose into the skin every 14 days. * : 6 mL * : 3 . * : HYDROcodone bit-homatropine (HYCODAN) 5-1.5 MG/5ML syrup * : Take 2.5-5 mL by mouth up to every 6 hours as needed for cough (Patient not taking: Reported on 06/07/2021) * : 100 mL * : 0 . * : metoprolol tartrate (LOPRESSOR) 25 MG tablet * : TAKE 1/2 TABLET BY MOUTH TWICE DAILY * : 90 tablet * : 3 . * : nitroGLYCERIN (NITROSTAT) 0.4 MG SL tablet * : Place 1 tablet under the tongue every 5 minutes as needed for chest pain. * : 25 tablet * : 3 . * : Oxcarbazepine (TRILEPTAL) 300 MG tablet * : Take one tablet by mouth twice daily * : 60 tablet * : 4 . * : predniSONE (DELTASONE) 20 MG tablet * : Taper: 3-3-3-2-2-2-1-1-1 Take once a day by mouth in the morning. 3 tablets for three days, 2 tablets for three days, then 1 tablet three days. (Patient not taking: Reported on 06/07/2021) * : 18 tablet * : 0 . * : predniSONE (DELTASONE) 20 MG tablet * : Take 3 tablets each morning for 2 days, then 2 tablets daily for 2 days, then 1 tablet daily for 2 days. * : 12 tablet * : 0 . * : valACYclovir (VALTREX) 1000 MG tablet * : As needed (Patient not taking: Reported on 06/07/2021) * : * : ALLERGIES: Allergies Allergen * : Reactions . * : Crestor Rosuvastatin * : * : * : Myalgia . * : Lovastatin * : Other (See Comments) * : * : Myalgias . * : Niaspan Niacin Er * : Itching . * : Penicillins * : Other (See Comments) * : * : Child hood allergy has patient had a PCN reaction causing immediate rash, facial/tongue/throat swelling, SOB or lightheadedness with hypotension: No Has patient had a PCN reaction causing severe rash involving mucus membranes or skin necrosis: No has patient had a PCN reaction that required hospitalization: No has patient had a PCN reaction occurring within the last 10 years: No if all of the above  answers are "NO", then may proceed with Cephalosporin use. . * : Pitavastatin * : Other (See Comments) * : * : Joints ache . * : Zocor Simvastatin * : Other (See Comments) * : * : Myalgias FAMILY HISTORY: Family History Problem * : Relation * : Age of Onset . * : Parkinsonism * : Mother * : . * : Aneurysm * : Mother * : 78 * :     Dissected . * : Hypertension * : Mother * : 22 . * : Arthritis * : Mother * : 58 * :     Rheumatoid arthritis . * : Ulcers * : Father * : 60 * :     Peptic ulcers . * : Cancer * : Father * : 37 . * : Hypertension * : Brother * : 3 . * : Hypertension * : Brother * : 50 . * : Heart disease * : Maternal Grandfather * : 86 REVIEW OF SYSTEMS: Negative unless as mentioned above. PHYSICAL EXAMINATION: Grossly appears in no acute distress. Affect and eye contact normal. Alert, awake, oriented to time, place, space. Speech and comprehension normal. No gross lateralizing abnormal neurological signs. Station and gait normal. ASSESSMENT AND PLAN: The results of the MRA were reviewed with the patient. This reveals presence of an approximately 5 mm aneurysm  arising in the left para ophthalmic region. The natural history of unruptured brain aneurysms was reviewed in detail. Risk of rupture of 1-2% per year was addressed. Increased risk of rupture is associated with history of smoking tobacco and hypertension. Illicit chemical such as cocaine, or amphetamines has often been reportedly associated with increased risk of growth and rupture of aneurysms. Risk of growth and rupture also has an associated with hypertension. Management considerations are those of eliminating via aneurysms from the parent circulation via endovascular means. Potential options of endovascular obliteration were reviewed with the patient. These included primary coiling, stent assisted coiling, use of flow diversion, and intra saccular flow disruption devices. Mechanisms of healing were reviewed related to these procedures. The  procedure would be under general anesthesia with treatment curative via transfemoral or a transradial route. Following the procedure, the patient would stay overnight in the neuro ICU for close neurologic observations. Risk of a complication of 1% chance of a thromboembolic stroke with a delayed possibility of a intracranial hemorrhage. The other option was to continue with conservative management with regular neuroimaging studies to evaluate for the changes in the aneurysms morphology in terms of size and/or morphology. Questions were answered to the patient's satisfaction. The patient would like to discuss further before finalizing her decision. In the meantime, the patient was advised to maintain adequate hydration. She was encouraged to call should she have any concerns or questions. Electronically Signed   By: Luanne Bras M.D.   On: 01/27/2022 07:44    Labs:  CBC: Recent Labs    02/13/22 0640  WBC 4.5  HGB 12.6  HCT 36.9  PLT 318    COAGS: Recent Labs    02/13/22 0640  INR 0.9    BMP: Recent Labs    03/01/21 1525 10/18/21 1541 02/13/22 0640  NA 144 141 143  K 4.1 4.6 3.5  CL 106 105 108  CO2 _0 GLUCOSE 92 108* 106*  BUN _1 CALCIUM 9.2 10.0 9.2  CREATININE 0.87 1.03 0.95  GFRNONAA  --   --  >60    LIVER FUNCTION TESTS: No results for input(s): "BILITOT", "AST", "ALT", "ALKPHOS", "PROT", "ALBUMIN" in the last 8760 hours.  TUMOR MARKERS: No results for input(s): "AFPTM", "CEA", "CA199", "CHROMGRNA" in the last 8760 hours.  Assessment and Plan:  Left para-ophthalmic aneurysm: [name], 56 year old female, presents today to the [facility] Interventional Radiology department for an image-guided diagnostic cerebral angiogram with possible intervention, possible treatment of left para-ophthalmic aneurysm. This procedure will be done under general anesthesia with planned overnight observation in the Neuro ICU.   Risks and benefits of this were  discussed with the patient including, but not limited to bleeding, infection, vascular injury or contrast induced renal failure. The risk of stroke was discussed.   This interventional procedure involves the use of X-rays and because of the nature of the planned procedure, it is possible that we will have prolonged use of X-ray fluoroscopy.  Potential radiation risks to you include (but are not limited to) the following: - A slightly elevated risk for cancer  several years later in life. This risk is typically less than 0.5% percent. This risk is low in comparison to the normal incidence of human cancer, which is 33% for women and 50% for men according to the [employer]. - Radiation induced injury can include skin redness, resembling a rash, tissue breakdown / ulcers and hair loss (which can be temporary  or permanent).   The likelihood of either of these occurring depends on the difficulty of the procedure and whether you are sensitive to radiation due to previous procedures, disease, or genetic conditions.   IF your procedure requires a prolonged use of radiation, you will be notified and given written instructions for further action.  It is your responsibility to monitor the irradiated area for the 2 weeks following the procedure and to notify your physician if you are concerned that you have suffered a radiation induced injury.    All of the patient's questions were answered, patient is agreeable to proceed. She has been NPO.   Consent signed and in chart.  Thank you for this interesting consult.  I greatly enjoyed meeting ARMIYAH CAPRON and look forward to participating in their care.  A copy of this report was sent to the requesting provider on this date.  Electronically Signed: Soyla Dryer, AGACNP-BC [phone] 02/13/2022, 7:32 AM   I spent a total of  30 Minutes   in face to face in clinical consultation, greater than 50% of which was counseling/coordinating care  for cerebral angiogram with possible intervention.

## 2022-02-13 NOTE — Sedation Documentation (Signed)
Patient transported to PACU with CRNA [facility]. Lani RN at the bedside to receive patient. Right wrist TR Band in place, 11 ml of air. Site is clean, dry and intact. No drainage noted. Soft to touch via palpation, no hematoma noted. +2 radial pulse

## 2022-02-13 NOTE — Anesthesia Procedure Notes (Signed)
Procedure Name: Intubation Date/Time: 02/13/2022 8:44 AM  Performed by: Lavell Luster, CRNAPre-anesthesia Checklist: Patient identified, Emergency Drugs available, Suction available, Patient being monitored and Timeout performed Patient Re-evaluated:Patient Re-evaluated prior to induction Oxygen Delivery Method: Circle system utilized Preoxygenation: Pre-oxygenation with 100% oxygen Induction Type: IV induction Ventilation: Mask ventilation without difficulty Laryngoscope Size: Mac and 3 Grade View: Grade I Tube type: Oral Tube size: 7.0 mm Number of attempts: 1 Airway Equipment and Method: Stylet Placement Confirmation: ETT inserted through vocal cords under direct vision, positive ETCO2 and breath sounds checked- equal and bilateral Secured at: 21 cm Tube secured with: Tape Dental Injury: Teeth and Oropharynx as per pre-operative assessment

## 2022-02-13 NOTE — Procedures (Signed)
  The note originally documented on this encounter has been moved the the encounter in which it belongs.  

## 2022-02-13 NOTE — H&P (Signed)
  The note originally documented on this encounter has been moved the the encounter in which it belongs.  

## 2022-02-14 ENCOUNTER — Other Ambulatory Visit (HOSPITAL_COMMUNITY): Payer: Self-pay

## 2022-02-14 ENCOUNTER — Encounter (HOSPITAL_COMMUNITY): Payer: Self-pay | Admitting: Interventional Radiology

## 2022-02-14 LAB — BASIC METABOLIC PANEL
Anion gap: 5 (ref 5–15)
BUN: 11 mg/dL (ref 6–20)
CO2: 22 mmol/L (ref 22–32)
Calcium: 8.6 mg/dL — ABNORMAL LOW (ref 8.9–10.3)
Chloride: 114 mmol/L — ABNORMAL HIGH (ref 98–111)
Creatinine, Ser: 0.86 mg/dL (ref 0.44–1.00)
GFR, Estimated: >60 mL/min (ref >60)
Glucose, Bld: 133 mg/dL — ABNORMAL HIGH (ref 70–99)
Potassium: 3.4 mmol/L — ABNORMAL LOW (ref 3.5–5.1)
Sodium: 141 mmol/L (ref 135–145)

## 2022-02-14 LAB — HEPARIN LEVEL (UNFRACTIONATED): Heparin Unfractionated: 0.11 IU/mL — ABNORMAL LOW (ref 0.30–0.70)

## 2022-02-14 LAB — CBC WITH DIFFERENTIAL/PLATELET
Abs Immature Granulocytes: 0.02 10*3/uL (ref 0.00–0.07)
Basophils Absolute: 0 10*3/uL (ref 0.0–0.1)
Basophils Relative: 0 %
Eosinophils Absolute: 0 10*3/uL (ref 0.0–0.5)
Eosinophils Relative: 0 %
HCT: 33.1 % — ABNORMAL LOW (ref 36.0–46.0)
Hemoglobin: 11.2 g/dL — ABNORMAL LOW (ref 12.0–15.0)
Immature Granulocytes: 0 %
Lymphocytes Relative: 16 %
Lymphs Abs: 1.1 10*3/uL (ref 0.7–4.0)
MCH: 31.3 pg (ref 26.0–34.0)
MCHC: 33.8 g/dL (ref 30.0–36.0)
MCV: 92.5 fL (ref 80.0–100.0)
Monocytes Absolute: 0.4 10*3/uL (ref 0.1–1.0)
Monocytes Relative: 5 %
Neutro Abs: 5.5 10*3/uL (ref 1.7–7.7)
Neutrophils Relative %: 79 %
Platelets: 272 10*3/uL (ref 150–400)
RBC: 3.58 MIL/uL — ABNORMAL LOW (ref 3.87–5.11)
RDW: 12.8 % (ref 11.5–15.5)
WBC: 7 10*3/uL (ref 4.0–10.5)
nRBC: 0 % (ref 0.0–0.2)

## 2022-02-14 LAB — POCT ACTIVATED CLOTTING TIME
Activated Clotting Time: 197 seconds
Activated Clotting Time: 203 seconds

## 2022-02-14 MED ORDER — BUPROPION HCL ER (XL) 300 MG PO TB24
300.0000 mg | ORAL_TABLET | Freq: Every day | ORAL | Status: DC
  Administered 2022-02-14: 300 mg via ORAL
  Filled 2022-02-14: qty 1

## 2022-02-14 MED ORDER — OXCARBAZEPINE 300 MG PO TABS
300.0000 mg | ORAL_TABLET | Freq: Every day | ORAL | Status: DC
  Administered 2022-02-14: 300 mg via ORAL
  Filled 2022-02-14: qty 1

## 2022-02-14 MED ORDER — CLOPIDOGREL BISULFATE 75 MG PO TABS
75.0000 mg | ORAL_TABLET | Freq: Every morning | ORAL | 3 refills | Status: DC
  Filled 2022-02-14: qty 90, 90d supply, fill #0
  Filled 2022-03-08 – 2022-05-04 (×3): qty 90, 90d supply, fill #1
  Filled 2022-05-15 – 2022-08-08 (×2): qty 90, 90d supply, fill #2
  Filled 2022-10-17 – 2022-11-23 (×2): qty 90, 90d supply, fill #3

## 2022-02-14 MED ORDER — POTASSIUM CHLORIDE CRYS ER 20 MEQ PO TBCR
40.0000 meq | EXTENDED_RELEASE_TABLET | Freq: Once | ORAL | Status: AC
  Administered 2022-02-14: 40 meq via ORAL
  Filled 2022-02-14: qty 2

## 2022-02-14 MED ORDER — METOPROLOL TARTRATE 25 MG PO TABS
12.5000 mg | ORAL_TABLET | Freq: Two times a day (BID) | ORAL | Status: DC
  Administered 2022-02-14: 12.5 mg via ORAL
  Filled 2022-02-14: qty 1

## 2022-02-14 NOTE — Progress Notes (Signed)
ANTICOAGULATION CONSULT NOTE - Follow Up Consult  Pharmacy Consult for heparin Indication:  s/p right vertebral artery and bilateral common carotid arteriograms  Labs: Recent Labs    02/13/22 0640 02/13/22 2043 02/14/22 0500  HGB 12.6  --  11.2*  HCT 36.9  --  33.1*  PLT 318  --  272  LABPROT 12.3  --   --   INR 0.9  --   --   HEPARINUNFRC  --  <0.10* 0.11*  CREATININE 0.95  --   --     Assessment/Plan:  56yo female therapeutic on heparin after rate change. Will continue infusion at current rate of 800 units/hr until off in am.    Vernard Gambles, PharmD, BCPS  02/14/2022,5:29 AM

## 2022-02-14 NOTE — Anesthesia Postprocedure Evaluation (Signed)
Anesthesia Post Note  Patient: Colleen Dillon  Procedure(s) Performed: aneurysm embolization     Patient location during evaluation: PACU Anesthesia Type: General Level of consciousness: awake and alert Pain management: pain level controlled Vital Signs Assessment: post-procedure vital signs reviewed and stable Respiratory status: spontaneous breathing, nonlabored ventilation, respiratory function stable and patient connected to nasal cannula oxygen Cardiovascular status: blood pressure returned to baseline and stable Postop Assessment: no apparent nausea or vomiting Anesthetic complications: no   No notable events documented.  Last Vitals:  Vitals:   02/14/22 1215 02/14/22 1230  BP: (!) 126/51 (!) 127/52  Pulse: 62 (!) 58  Resp: 15 15  Temp:    SpO2: 95% 95%    Last Pain:  Vitals:   02/14/22 1155  TempSrc: Oral  PainSc:                  [name]

## 2022-02-14 NOTE — Discharge Instructions (Signed)
Activity Rest today, increase activity slowly. Continue taking 75 mg Plavix daily. Continue taking Aspirin 81 mg once daily. No bending, stooping, or lifting more than 10 pounds for 2 weeks. No heavy coughing while turning head for 2 weeks. No driving self for 2 weeks. Stay hydrated by drinking plenty of water.

## 2022-02-14 NOTE — Discharge Summary (Signed)
Patient ID: Colleen Dillon MRN: [id number] DOB/AGE: 06-11-1965 56 y.o.  Admit date: 02/13/2022 Discharge date: 02/14/2022  Supervising Physician: Luanne Bras  Patient Status: [name] - In-pt  Admission Diagnoses: ICA Aneurysm  Discharge Diagnoses:  Principal Problem:   Brain aneurysm   Discharged Condition: stable  Hospital Course:   56 y.o female inpatient. History of anxiety/depression, CAD/MI, morbid obesity with right sided trigeminal neuralgia. Found to have a cavernous left ICA aneurysm.  MR Angio from 8.16.23 shows stable 5 mm left ICA Para Opthalmic aneurysm .Patient was seen in [facility] clinic with Dr. [name] on 10.27.23 where treatment options were discussed. The Patient elected on endovascular embolization. S/p right vertebral artery and bilateral common carotid arteriograms via a right radial approach on 11.13.23 by Dr. Arlean Hopping. Treatment included endovascular embolization of lobulated wide neck right Para ophthalmic region aneurysm using a 4.25 x 14 pipeline Shield flow diverter device. The patient tolerated the procedure well.  She was admitted overnight for observation and ongoing management post-procedure. Her overnight stay was uneventful. She followed appropriate recommendations for best rest without issue. She tolerated a regular diet, and was able to mobilize around the room.  She has been able to void on own.  Ms Valbuena was assessed at bedside this AM alongside Dr. [name]. She endorses a mild headaches behind her eyes bilaterally. She denies any additional complaints.  Denies pain, headache dizziness; tingling, numbness nausea or vomiting. Her neuro exam is baseline compared to admission . Her right groin access site is soft with no active bleeding and no appreciable pseudoaneurysm. Dressing is C/D/I. Labs are stable this AM.  They are aware they will hear from scheduler with date and time of appointment. Patient given discharge instructions.   Consults:  None  Significant Diagnostic Studies: IR Radiologist Eval & Mgmt  Result Date: 01/27/2022 EXAM: NEW PATIENT OFFICE VISIT CHIEF COMPLAINT: Recent discovery of brain aneurysm. Current Pain Level: 1-10 HISTORY OF PRESENT ILLNESS: 56 year old right handed lady who has been referred for evaluation of a recently discovered intracranial brain aneurysm. Patient has been referred by Dr. [name], her neurologist. The patient has been following her neurologist for right-sided trigeminal neuralgia. During the workup, an MRI MRA of the brain was performed. On the MRI MRA of the brain 11/14/2021, discovery was made of a left para ophthalmic region aneurysm measuring approximately 5 mm. This is felt to be unrelated to her right-sided facial pain. On close questioning, the patient remains asymptomatic in terms of unusual headaches, nausea, vomiting, visual aberrations, amaurosis fugax, diplopia, loss of awareness, altered mental status or of seizures. The family history reports aneurysm in her mother question due to dissection. Patient reports reasonable control of her right side facial trigeminal neuralgia. She continues with her daily living activities, and also working normally at work. Systemically, no history of pathological symptomatology pertaining to cardiovascular, respiratory, gastrointestinal, GU systems. Denies recent chills, fever or rigors. Medication * : Sig * : Dispense * : Refill . * : acetaminophen (TYLENOL) 325 MG tablet * : Take 325 mg by mouth as needed. * : * : . * : acyclovir (ZOVIRAX) 200 MG capsule * : Take 3 capsules now then 1 capsule 5 times a day * : 38 capsule * : 3 . * : aspirin EC 81 MG tablet * : Take 81 mg by mouth every morning. * : * : . * : benzonatate (TESSALON PERLES) 100 MG capsule * : Take 2 capsules (200 mg total) by mouth 3 (  three) times daily for cough * : 60 capsule * : 0 . * : buPROPion (WELLBUTRIN XL) 300 MG 24 hr tablet * : Take 1 tablet by mouth in the morning daily * : 90 tablet  * : 3 . * : clopidogrel (PLAVIX) 75 MG tablet * : TAKE 1 TABLET BY MOUTH EVERY MORNING * : 90 tablet * : 3 . * : COVID-19 At Home Antigen Test (CARESTART COVID-19 HOME TEST) KIT * : Use as directed within package instructions (Patient not taking: Reported on 06/07/2021) * : 4 each * : 0 . * : Evolocumab (REPATHA SURECLICK) 657 MG/ML SOAJ * : Inject 1 dose into the skin every 14 days. * : 6 mL * : 3 . * : HYDROcodone bit-homatropine (HYCODAN) 5-1.5 MG/5ML syrup * : Take 2.5-5 mL by mouth up to every 6 hours as needed for cough (Patient not taking: Reported on 06/07/2021) * : 100 mL * : 0 . * : metoprolol tartrate (LOPRESSOR) 25 MG tablet * : TAKE 1/2 TABLET BY MOUTH TWICE DAILY * : 90 tablet * : 3 . * : nitroGLYCERIN (NITROSTAT) 0.4 MG SL tablet * : Place 1 tablet under the tongue every 5 minutes as needed for chest pain. * : 25 tablet * : 3 . * : Oxcarbazepine (TRILEPTAL) 300 MG tablet * : Take one tablet by mouth twice daily * : 60 tablet * : 4 . * : predniSONE (DELTASONE) 20 MG tablet * : Taper: 3-3-3-2-2-2-1-1-1 Take once a day by mouth in the morning. 3 tablets for three days, 2 tablets for three days, then 1 tablet three days. (Patient not taking: Reported on 06/07/2021) * : 18 tablet * : 0 . * : predniSONE (DELTASONE) 20 MG tablet * : Take 3 tablets each morning for 2 days, then 2 tablets daily for 2 days, then 1 tablet daily for 2 days. * : 12 tablet * : 0 . * : valACYclovir (VALTREX) 1000 MG tablet * : As needed (Patient not taking: Reported on 06/07/2021) * : * : ALLERGIES: Allergies Allergen * : Reactions . * : Crestor Rosuvastatin * : * : * : Myalgia . * : Lovastatin * : Other (See Comments) * : * : Myalgias . * : Niaspan Niacin Er * : Itching . * : Penicillins * : Other (See Comments) * : * : Child hood allergy has patient had a PCN reaction causing immediate rash, facial/tongue/throat swelling, SOB or lightheadedness with hypotension: No Has patient had a PCN reaction causing severe rash involving mucus  membranes or skin necrosis: No has patient had a PCN reaction that required hospitalization: No has patient had a PCN reaction occurring within the last 10 years: No if all of the above answers are "NO", then may proceed with Cephalosporin use. . * : Pitavastatin * : Other (See Comments) * : * : Joints ache . * : Zocor Simvastatin * : Other (See Comments) * : * : Myalgias FAMILY HISTORY: Family History Problem * : Relation * : Age of Onset . * : Parkinsonism * : Mother * : . * : Aneurysm * : Mother * : 46 * :     Dissected . * : Hypertension * : Mother * : 53 . * : Arthritis * : Mother * : 39 * :     Rheumatoid arthritis . * : Ulcers * : Father * : 23 * :     Peptic ulcers . * :  Cancer * : Father * : 80 . * : Hypertension * : Brother * : 59 . * : Hypertension * : Brother * : 52 . * : Heart disease * : Maternal Grandfather * : 71 REVIEW OF SYSTEMS: Negative unless as mentioned above. PHYSICAL EXAMINATION: Grossly appears in no acute distress. Affect and eye contact normal. Alert, awake, oriented to time, place, space. Speech and comprehension normal. No gross lateralizing abnormal neurological signs. Station and gait normal. ASSESSMENT AND PLAN: The results of the MRA were reviewed with the patient. This reveals presence of an approximately 5 mm aneurysm arising in the left para ophthalmic region. The natural history of unruptured brain aneurysms was reviewed in detail. Risk of rupture of 1-2% per year was addressed. Increased risk of rupture is associated with history of smoking tobacco and hypertension. Illicit chemical such as cocaine, or amphetamines has often been reportedly associated with increased risk of growth and rupture of aneurysms. Risk of growth and rupture also has an associated with hypertension. Management considerations are those of eliminating via aneurysms from the parent circulation via endovascular means. Potential options of endovascular obliteration were reviewed with the patient. These  included primary coiling, stent assisted coiling, use of flow diversion, and intra saccular flow disruption devices. Mechanisms of healing were reviewed related to these procedures. The procedure would be under general anesthesia with treatment curative via transfemoral or a transradial route. Following the procedure, the patient would stay overnight in the neuro ICU for close neurologic observations. Risk of a complication of 1% chance of a thromboembolic stroke with a delayed possibility of a intracranial hemorrhage. The other option was to continue with conservative management with regular neuroimaging studies to evaluate for the changes in the aneurysms morphology in terms of size and/or morphology. Questions were answered to the patient's satisfaction. The patient would like to discuss further before finalizing her decision. In the meantime, the patient was advised to maintain adequate hydration. She was encouraged to call should she have any concerns or questions. Electronically Signed   By: Luanne Bras M.D.   On: 01/27/2022 07:44    Treatments: IV hydration, analgesia: acetaminophen, cardiac meds: cleviprex and transitioned to home medication metoprolol, and procedures:  endovascular embolization. S/p right vertebral artery and bilateral common carotid arteriograms via a right radial approach  Discharge Exam: Blood pressure 131/62, pulse 80, temperature 97.9 F (36.6 C), temperature source Oral, resp. rate 20, height _0  (1.575 m), weight 200 lb (90.7 kg), SpO2 92 %. Physical Exam Vitals and nursing note reviewed.  Constitutional:      Appearance: She is well-developed.  HENT:     Head: Normocephalic and atraumatic.  Eyes:     Conjunctiva/sclera: Conjunctivae normal.  Cardiovascular:     Rate and Rhythm: Tachycardia present.  Pulmonary:     Effort: Pulmonary effort is normal.  Musculoskeletal:        General: Normal range of motion.     Cervical back: Normal range of motion.   Skin:    General: Skin is warm.  Neurological:     Mental Status: She is alert and oriented to person, place, and time.  Psychiatric:        Mood and Affect: Mood normal.        Behavior: Behavior normal.        Thought Content: Thought content normal.        Judgment: Judgment normal.     Disposition: Discharge disposition: 01-Home or Self Care  Discharge Instructions     Diet - low sodium heart healthy   Complete by: As directed    Increase activity slowly   Complete by: As directed    Remove dressing in 24 hours   Complete by: As directed       Allergies as of 02/14/2022       Reactions   Crestor [rosuvastatin]    Myalgia   Lovastatin Other (See Comments)   Myalgias   Niaspan [niacin Er] Itching   Penicillins Other (See Comments)   Child hood allergy Has patient had a PCN reaction causing immediate rash, facial/tongue/throat swelling, SOB or lightheadedness with hypotension: No  Has patient had a PCN reaction causing severe rash involving mucus membranes or skin necrosis: No Has patient had a PCN reaction that required hospitalization: No Has patient had a PCN reaction occurring within the last 10 years: No If all of the above answers are "NO", then may proceed with Cephalosporin use.   Pitavastatin Other (See Comments)   Joints ache   Zocor [simvastatin] Other (See Comments)   Myalgias        Medication List     STOP taking these medications    benzonatate 100 MG capsule Commonly known as: Tessalon Perles       TAKE these medications    acetaminophen 650 MG CR tablet Commonly known as: TYLENOL Take 650-1,300 mg by mouth every 8 (eight) hours as needed for pain.   acyclovir 200 MG capsule Commonly known as: ZOVIRAX Take 3 capsules now then 1 capsule 5 times a day What changed:  how much to take how to take this when to take this reasons to take this   aspirin EC 81 MG tablet Take 81 mg by mouth every morning.   buPROPion 300  MG 24 hr tablet Commonly known as: WELLBUTRIN XL Take 1 tablet by mouth in the morning daily   clopidogrel 75 MG tablet Commonly known as: PLAVIX TAKE 1 TABLET BY MOUTH EVERY MORNING   metoprolol tartrate 25 MG tablet Commonly known as: LOPRESSOR TAKE 1/2 TABLET BY MOUTH TWICE DAILY   nitroGLYCERIN 0.4 MG SL tablet Commonly known as: NITROSTAT Place 1 tablet under the tongue every 5 minutes as needed for chest pain.   Oxcarbazepine 300 MG tablet Commonly known as: TRILEPTAL Take one tablet by mouth twice daily   Repatha SureClick 191 MG/ML Soaj Generic drug: Evolocumab Inject 1 dose into the skin every 14 days.          Electronically Signed: Jacqualine Mau, NP 02/14/2022, 10:14 AM   I have spent Greater Than 30 Minutes discharging [name].

## 2022-02-15 ENCOUNTER — Other Ambulatory Visit (HOSPITAL_COMMUNITY): Payer: Self-pay | Admitting: Interventional Radiology

## 2022-02-15 DIAGNOSIS — I671 Cerebral aneurysm, nonruptured: Secondary | ICD-10-CM

## 2022-02-15 HISTORY — PX: IR NEURO EACH ADD'L AFTER BASIC UNI LEFT (MS): IMG5373

## 2022-02-15 HISTORY — PX: IR CT HEAD LTD: IMG2386

## 2022-02-15 HISTORY — PX: IR ANGIOGRAM FOLLOW UP STUDY: IMG697

## 2022-02-27 ENCOUNTER — Ambulatory Visit (HOSPITAL_COMMUNITY)
Admission: RE | Admit: 2022-02-27 | Discharge: 2022-02-27 | Disposition: A | Discharge status: Home / Self Care | Payer: 59 | Source: Ambulatory Visit | Attending: Radiology | Admitting: Radiology

## 2022-02-27 DIAGNOSIS — I671 Cerebral aneurysm, nonruptured: Secondary | ICD-10-CM

## 2022-02-28 ENCOUNTER — Ambulatory Visit (HOSPITAL_COMMUNITY)
Admission: RE | Admit: 2022-02-28 | Discharge: 2022-02-28 | Disposition: A | Discharge status: Home / Self Care | Payer: 59 | Source: Ambulatory Visit | Attending: Radiology | Admitting: Radiology

## 2022-02-28 ENCOUNTER — Telehealth: Payer: 59 | Admitting: Physician Assistant

## 2022-02-28 DIAGNOSIS — J208 Acute bronchitis due to other specified organisms: Secondary | ICD-10-CM | POA: Diagnosis not present

## 2022-02-28 DIAGNOSIS — Z9889 Other specified postprocedural states: Secondary | ICD-10-CM | POA: Diagnosis not present

## 2022-02-28 DIAGNOSIS — B9689 Other specified bacterial agents as the cause of diseases classified elsewhere: Secondary | ICD-10-CM | POA: Diagnosis not present

## 2022-02-28 DIAGNOSIS — I671 Cerebral aneurysm, nonruptured: Secondary | ICD-10-CM | POA: Diagnosis not present

## 2022-02-28 MED ORDER — DOXYCYCLINE HYCLATE 100 MG PO TABS: 100.0000 mg | ORAL_TABLET | Freq: Two times a day (BID) | ORAL | 0 refills | Status: DC

## 2022-02-28 MED ORDER — BENZONATATE 100 MG PO CAPS: 100.0000 mg | ORAL_CAPSULE | Freq: Three times a day (TID) | ORAL | 0 refills | Status: DC | PRN

## 2022-02-28 NOTE — Progress Notes (Signed)

## 2022-02-28 NOTE — Progress Notes (Signed)
I have spent 5 minutes in review of e-visit questionnaire, review and updating patient chart, medical decision making and response to patient.   Yousef Huge Cody Seraphim Trow, PA-C    

## 2022-03-01 HISTORY — PX: IR RADIOLOGIST EVAL & MGMT: IMG5224

## 2022-03-09 ENCOUNTER — Other Ambulatory Visit (HOSPITAL_COMMUNITY): Payer: Self-pay

## 2022-03-17 ENCOUNTER — Telehealth: Payer: Self-pay | Admitting: Internal Medicine

## 2022-03-17 NOTE — Telephone Encounter (Signed)
PA for repatha submitted via CMM (Key: [id number])

## 2022-03-30 ENCOUNTER — Other Ambulatory Visit (HOSPITAL_COMMUNITY): Payer: Self-pay

## 2022-03-30 NOTE — Telephone Encounter (Signed)
Per letter received from [employer], no PA is required.

## 2022-04-10 ENCOUNTER — Other Ambulatory Visit: Payer: Self-pay | Admitting: *Deleted

## 2022-04-10 DIAGNOSIS — E782 Mixed hyperlipidemia: Secondary | ICD-10-CM

## 2022-04-20 ENCOUNTER — Other Ambulatory Visit (HOSPITAL_COMMUNITY): Payer: Self-pay

## 2022-04-20 ENCOUNTER — Other Ambulatory Visit: Payer: Self-pay | Admitting: Obstetrics and Gynecology

## 2022-04-20 DIAGNOSIS — Z1231 Encounter for screening mammogram for malignant neoplasm of breast: Secondary | ICD-10-CM

## 2022-04-20 NOTE — Progress Notes (Deleted)
NEUROLOGY FOLLOW UP OFFICE NOTE  Colleen Dillon [id number]  Assessment/Plan:   Right-sided trigeminal neuralgia - much improved. Left paraophthalmic artery ICA aneurysm s/p embolization   oxcarbazepine 382m twice daily. Check BMP today In regards to the aneurysm, I would actually like her evaluated by endovascular interventional radiology.  Will first order an updated MRA of head and then send to them. Follow up 6 months.     Subjective:  Colleen Dillon a 57year old female with CAD, HLD, depression/anxiety and history of PSVT who follows up for right sided trigeminal neuralgia.  UPDATE: Current medications:  Oxcarbazepine 3057mBID, Wellbutrin XL 30021maily, Plavix, ASA 35m16mopressor   To follow up on her cerebral aneurysm, she had a repeat MRA of head on 11/14/2021, which was personally reviewed and revealed stable 5 mm left ICA paraophthalmic aneurysm.  Due to location, she was referred to neuro-endovascular for further evaluation.  Underwent endovascular embolization on 11/13 with a  4.25 x 14 mm pipeline shield flow diverter device.    BMP on 11/14 revealed Na 141.    HISTORY: In September 2022, she began experiencing right sided facial pain.  She describes shooting/numbing pain and tingling from the right cheek radiating up to the side of the right eye.  Eventually, it also started radiating to the chin and right side of the mouth.  Lasts a few seconds.  Usually occurs spontaneously.  Sucking on a straw may trigger it, but otherwise not triggered by chewing, brushing teeth, talking or cold wind.  Occurs over 20 times a day.  She saw her dentist who found no dental abnormalities.  She was prescribed a prednisone taper which provided short-term relief  .  MRI of brain/trigeminal nerves with and without contrast and MRA of head on 01/30/2021 personally reviewed showed incidental 5 mm cavernous left ICA aneurysm but no findings to explain symptoms.  She was started on  gabapentin.  She was prescribed 300mg3mee times daily but is only able to tolerate 100mg 87mM and 300mg i21m.   Past medication:  gabapentin 100mg in28mand 300mg PM 46mT MEDICAL HISTORY: Past Medical History:  Diagnosis Date   Anxiety    CAD (coronary artery disease)    Cardiac murmur    Depression    Gallstones    GERD (gastroesophageal reflux disease)    no current problems per patient 02/10/22   History of echocardiogram    Echo 5/18: EF 60-65, and normal wall motion, grade 1 diastolic dysfunction   History of PSVT (paroxysmal supraventricular tachycardia) 2018   monitor    History of smoking    Hyperlipidemia    Obesity    Old MI (myocardial infarction) 2006   Pre-syncope 2018    MEDICATIONS: Current Outpatient Medications on File Prior to Visit  Medication Sig Dispense Refill   acetaminophen (TYLENOL) 650 MG CR tablet Take 650-1,300 mg by mouth every 8 (eight) hours as needed for pain.     acyclovir (ZOVIRAX) 200 MG capsule Take 3 capsules now then 1 capsule 5 times a day (Patient taking differently: Take 200 mg by mouth daily as needed (fever blisters).) 38 capsule 3   aspirin EC 81 MG tablet Take 81 mg by mouth every morning.      benzonatate (TESSALON) 100 MG capsule Take 1 capsule (100 mg total) by mouth 3 (three) times daily as needed for cough. 30 capsule 0   buPROPion (WELLBUTRIN XL) 300 MG 24 hr tablet  Take 1 tablet by mouth in the morning daily 90 tablet 3   clopidogrel (PLAVIX) 75 MG tablet Take 1 tablet (75 mg total) by mouth every morning. 90 tablet 3   doxycycline (VIBRA-TABS) 100 MG tablet Take 1 tablet (100 mg total) by mouth 2 (two) times daily. 14 tablet 0   Evolocumab (REPATHA SURECLICK) XX123456 MG/ML SOAJ Inject 1 dose into the skin every 14 days. 6 mL 3   metoprolol tartrate (LOPRESSOR) 25 MG tablet TAKE 1/2 TABLET BY MOUTH TWICE DAILY 90 tablet 3   nitroGLYCERIN (NITROSTAT) 0.4 MG SL tablet Place 1 tablet under the tongue every 5 minutes as needed for  chest pain. 25 tablet 3   Oxcarbazepine (TRILEPTAL) 300 MG tablet Take one tablet by mouth twice daily 60 tablet 5   No current facility-administered medications on file prior to visit.    ALLERGIES: Allergies  Allergen Reactions   Crestor [Rosuvastatin]     Myalgia    Lovastatin Other (See Comments)    Myalgias    Niaspan [Niacin Er] Itching   Penicillins Other (See Comments)    Child hood allergy Has patient had a PCN reaction causing immediate rash, facial/tongue/throat swelling, SOB or lightheadedness with hypotension: No  Has patient had a PCN reaction causing severe rash involving mucus membranes or skin necrosis: No Has patient had a PCN reaction that required hospitalization: No Has patient had a PCN reaction occurring within the last 10 years: No If all of the above answers are "NO", then may proceed with Cephalosporin use.   Pitavastatin Other (See Comments)    Joints ache   Zocor [Simvastatin] Other (See Comments)    Myalgias     FAMILY HISTORY: Family History  Problem Relation Age of Onset   Parkinsonism Mother    Aneurysm Mother 60       Dissected   Hypertension Mother 7   Arthritis Mother 62       Rheumatoid arthritis   Ulcers Father 65       Peptic ulcers   Cancer Father 1   Hypertension Brother 17   Hypertension Brother 53   Heart disease Maternal Grandfather 55      Objective:  *** General: No acute distress.  Patient appears well-groomed.   Head:  Normocephalic/atraumatic Eyes:  Fundi examined but not visualized Neck: supple, no paraspinal tenderness, full range of motion Heart:  Regular rate and rhythm Neurological Exam: ***   Metta Clines, DO  CC: [name], MD

## 2022-04-24 ENCOUNTER — Encounter: Payer: Self-pay | Admitting: Neurology

## 2022-04-24 ENCOUNTER — Ambulatory Visit: Payer: 59 | Admitting: Neurology

## 2022-05-04 ENCOUNTER — Other Ambulatory Visit (HOSPITAL_COMMUNITY): Payer: Self-pay

## 2022-05-15 ENCOUNTER — Other Ambulatory Visit: Payer: Self-pay

## 2022-05-15 ENCOUNTER — Other Ambulatory Visit (HOSPITAL_COMMUNITY): Payer: Self-pay

## 2022-05-15 ENCOUNTER — Encounter: Payer: Self-pay | Admitting: Pharmacist

## 2022-05-18 ENCOUNTER — Other Ambulatory Visit: Payer: Self-pay

## 2022-05-24 ENCOUNTER — Other Ambulatory Visit (HOSPITAL_COMMUNITY): Payer: Self-pay

## 2022-05-25 ENCOUNTER — Other Ambulatory Visit (HOSPITAL_COMMUNITY): Payer: Self-pay

## 2022-05-26 ENCOUNTER — Other Ambulatory Visit (HOSPITAL_COMMUNITY): Payer: Self-pay

## 2022-06-03 ENCOUNTER — Other Ambulatory Visit (HOSPITAL_COMMUNITY): Payer: Self-pay

## 2022-06-05 ENCOUNTER — Other Ambulatory Visit (HOSPITAL_COMMUNITY): Payer: Self-pay

## 2022-06-08 ENCOUNTER — Ambulatory Visit
Admission: RE | Admit: 2022-06-08 | Discharge: 2022-06-08 | Disposition: A | Discharge status: Home / Self Care | Payer: Commercial Managed Care - PPO | Source: Ambulatory Visit | Attending: Obstetrics and Gynecology | Admitting: Obstetrics and Gynecology

## 2022-06-08 DIAGNOSIS — Z1231 Encounter for screening mammogram for malignant neoplasm of breast: Secondary | ICD-10-CM

## 2022-06-21 ENCOUNTER — Other Ambulatory Visit: Payer: Self-pay | Admitting: Internal Medicine

## 2022-06-22 ENCOUNTER — Other Ambulatory Visit: Payer: Self-pay

## 2022-06-23 ENCOUNTER — Other Ambulatory Visit: Payer: Self-pay

## 2022-06-23 ENCOUNTER — Other Ambulatory Visit (HOSPITAL_COMMUNITY): Payer: Self-pay

## 2022-06-23 MED ORDER — METOPROLOL TARTRATE 25 MG PO TABS
12.5000 mg | ORAL_TABLET | Freq: Two times a day (BID) | ORAL | 0 refills | Status: DC
  Filled 2022-06-23: qty 90, fill #0
  Filled 2022-07-28: qty 90, 90d supply, fill #0

## 2022-07-05 ENCOUNTER — Other Ambulatory Visit (HOSPITAL_COMMUNITY): Payer: Self-pay

## 2022-07-07 ENCOUNTER — Telehealth: Payer: Self-pay | Admitting: Internal Medicine

## 2022-07-07 NOTE — Telephone Encounter (Signed)
PA for repatha sureclick submitted via CMM (Key: [id number])

## 2022-07-11 NOTE — Telephone Encounter (Signed)
Authorization Expiration Date: July 09, 2023.

## 2022-07-13 ENCOUNTER — Other Ambulatory Visit (HOSPITAL_COMMUNITY): Payer: Self-pay

## 2022-07-13 DIAGNOSIS — G500 Trigeminal neuralgia: Secondary | ICD-10-CM | POA: Diagnosis not present

## 2022-07-13 DIAGNOSIS — Z0000 Encounter for general adult medical examination without abnormal findings: Secondary | ICD-10-CM | POA: Diagnosis not present

## 2022-07-13 DIAGNOSIS — I2510 Atherosclerotic heart disease of native coronary artery without angina pectoris: Secondary | ICD-10-CM | POA: Diagnosis not present

## 2022-07-13 DIAGNOSIS — R7309 Other abnormal glucose: Secondary | ICD-10-CM | POA: Diagnosis not present

## 2022-07-13 DIAGNOSIS — R454 Irritability and anger: Secondary | ICD-10-CM | POA: Diagnosis not present

## 2022-07-13 DIAGNOSIS — E669 Obesity, unspecified: Secondary | ICD-10-CM | POA: Diagnosis not present

## 2022-07-13 MED ORDER — BUPROPION HCL ER (XL) 300 MG PO TB24
300.0000 mg | ORAL_TABLET | Freq: Every morning | ORAL | 4 refills | Status: DC
  Filled 2022-07-13 – 2022-09-18 (×4): qty 90, 90d supply, fill #0
  Filled 2022-12-13: qty 90, 90d supply, fill #1
  Filled 2023-03-16: qty 90, 90d supply, fill #2
  Filled 2023-05-08: qty 90, 90d supply, fill #3

## 2022-07-14 ENCOUNTER — Other Ambulatory Visit (HOSPITAL_COMMUNITY): Payer: Self-pay

## 2022-07-17 ENCOUNTER — Other Ambulatory Visit (HOSPITAL_COMMUNITY): Payer: Self-pay

## 2022-07-17 ENCOUNTER — Other Ambulatory Visit: Payer: Self-pay

## 2022-07-21 DIAGNOSIS — E782 Mixed hyperlipidemia: Secondary | ICD-10-CM | POA: Diagnosis not present

## 2022-07-22 LAB — LIPID PANEL
Chol/HDL Ratio: 3.3 ratio (ref 0.0–4.4)
Cholesterol, Total: 186 mg/dL (ref 100–199)
HDL: 57 mg/dL (ref >39)
LDL Chol Calc (NIH): 101 mg/dL — ABNORMAL HIGH (ref 0–99)
Triglycerides: 159 mg/dL — ABNORMAL HIGH (ref 0–149)
VLDL Cholesterol Cal: 28 mg/dL (ref 5–40)

## 2022-07-27 ENCOUNTER — Encounter: Payer: Self-pay | Admitting: Internal Medicine

## 2022-07-27 ENCOUNTER — Ambulatory Visit: Payer: Commercial Managed Care - PPO | Attending: Internal Medicine | Admitting: Internal Medicine

## 2022-07-27 VITALS — BP 130/78 | HR 58 | Ht 62.0 in | Wt 216.2 lb

## 2022-07-27 DIAGNOSIS — I2510 Atherosclerotic heart disease of native coronary artery without angina pectoris: Secondary | ICD-10-CM | POA: Diagnosis not present

## 2022-07-27 DIAGNOSIS — I252 Old myocardial infarction: Secondary | ICD-10-CM | POA: Diagnosis not present

## 2022-07-27 DIAGNOSIS — I10 Essential (primary) hypertension: Secondary | ICD-10-CM | POA: Diagnosis not present

## 2022-07-27 DIAGNOSIS — E782 Mixed hyperlipidemia: Secondary | ICD-10-CM | POA: Diagnosis not present

## 2022-07-27 NOTE — Patient Instructions (Addendum)
Medication Instructions:  Your physician recommends that you continue on your current medications as directed. Please refer to the Current Medication list given to you today.  *If you need a refill on your cardiac medications before your next appointment, please call your pharmacy*  Follow-Up: At [facility], you and your health needs are our priority.  As part of our continuing mission to provide you with exceptional heart care, we have created designated Provider Care Teams.  These Care Teams include your primary Cardiologist (physician) and Advanced Practice Providers (APPs -  Physician Assistants and Nurse Practitioners) who all work together to provide you with the care you need, when you need it.  We recommend signing up for the patient portal called "MyChart".  Sign up information is provided on this After Visit Summary.  MyChart is used to connect with patients for Virtual Visits (Telemedicine).  Patients are able to view lab/test results, encounter notes, upcoming appointments, etc.  Non-urgent messages can be sent to your provider as well.   To learn more about what you can do with MyChart, go to [email or web address].    Your next appointment:   12 month(s)  Provider:   [name], MD    --fasting labs 1 week prior (Lipid panel)

## 2022-07-27 NOTE — Progress Notes (Signed)
Date:  07/27/2022   ID:  Colleen Dillon, DOB 25-Jan-1966, MRN [id number]  PCP:  Blair Heys, MD  Primary Cardiologist:  Rennis Golden  CC: No complaints  History of Present Illness: Colleen Dillon is a 57 y.o. female history of obesity, coronary artery disease, hyperlipidemia, hypertension, formerly followed by Dr. Alanda Amass - here to establish care with me today. Patient had acute DMI in 2006 with single vessel RCA disease with placement of overlapping bare metal stents. A recatheterization for repeat angina the same day and required subsequent DES stenting of the distal RCA and PLA branches peeling occluded at that time. Last catheterization was in 2010 showed 30-50% narrowing of the acute margin. The overlapping DES stents and IVUS interrogation showed good residual lumen. His chronic PDA occlusion with collaterals from the left. She ruled out for MI in July 2012 last nuclear stress test was 10/17/2010 showed no definite inducible or reversible ischemia the EF of 58%.  Last echo was April 2014 showed ejection fraction of 50-55% with normal wall motion. Is moderate mitral valve regurgitation. Currently works as a Engineer, civil (consulting) at [employer].   She last saw Wilburt Finlay, PA-C for symptoms of fatigue and chest pain this past summer and underwent a nuclear stress test on 10/02/2012 which was negative for ischemia. She exercised to 11.2 metabolic equivalents and had no chest pain. Overall she has done well and recently had a lipid profile which demonstrated LDL cholesterol of 122.  She was previously on Crestor 10 mg daily but was intolerant of this due to side effects including myalgias. She is also previously failed Zocor and lovastatin.  I saw Ms. Colleen Dillon back in the office today. She is doing well although she reports some morning headaches. She recently had some weight gain which she reports is due to stress eating. She's been under a lot of stress as her husband has some unusual neurologic disorder and her  daughter recently had surgery and is requiring attention and dressing changes. She denies any chest pain or worsening shortness of breath.  Mrs. Colleen Dillon returns today for follow-up. She reports a recently she's had some worsening shortness of breath. She's had a walk a longer distance up a hill to get to work and notes that she feels more short of breath. She does exercise about 3 times a week, but generally does not do any significant incline on the treadmill. She has managed to lose little bit of weight recently due to dietary changes as her husband is at goal on a strict diet. She denies any chest pain. Her EKG is essentially unchanged with sinus rhythm and occasional PVCs.  03/02/2016  Mrs. Colleen Dillon was seen today in follow-up. She reports no significant change in her shortness of breath. She has recently noticed an increase in her cholesterol. She has been intolerant to statins in the past. She was referred for evaluation of this. Last year her LDL was 105 with a particle number of 1121. Apparently this was higher recently from her primary care provider, however I do not have those results to review. She is requesting recommendations for treatment.  08/15/2016  Mrs. Colleen Dillon returns today for follow-up. She recently saw Tereso Newcomer, PA-C for near syncope. She had 2 episodes of rapid heart rate associated with graying out of her vision in one time she fell down to her knees and nearly passed out. 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 She was also placed on a monitor which she is currently wearing. I personally reviewed up loads from that monitor indicating several episodes of paroxysmal supraventricular tachycardia. Most of these episodes lasted less than 10 beats but likely represent the episode she had that caused  her to be presyncopal. In the past she's been using low-dose metoprolol as needed but has not taken any recently.  11/20/2016  Mrs. Bagheri returns today for follow-up. Overall she seems to be doing well. Heart rate appears well controlled on beta blocker. She feels like her metoprolol is helped with her fast heart rates. She denies any chest pain or worsening shortness of breath. She does have a history of dyslipidemia and given her history of coronary disease should be on treatment for that. Unfortunately she's been intolerant to 4 different statins at different doses as well as niacin in the past. We discussed new medications including the PCSK9 inhibitors which she may be a candidate for. However, it is been several years since she has last had a lipid profile.  08/28/2017  Mrs. Colleen Dillon was seen today in follow-up.  Recently she has been having difficulty with her gallbladder.  She is actually scheduled to have a cholecystectomy.  She is here for preoperative clearance.  Her last year she is done well without any chest pain or worsening shortness of breath.  She has been statin intolerant and we discussed starting PCSK9 inhibitors, but in the interim she enrolled in the CLEAR trial with Dr. Verl Dicker office.  We do not know whether she is on active medication or placebo.  This will continue to enroll into next year.  Denies any recurrent palpitations.  Of note she is on 162 mg dose of aspirin and Plavix.  02/04/2019  Mrs. Colleen Dillon returns today for follow-up.  She recently underwent left heart catheterization in July 2020 for chest pain and was found to have diffuse nonobstructive in-stent restenosis of the distal RCA with a patent PDA and normal LV function.  Medical therapy was recommended.  She continues to be enrolled in the clear outcomes trial through Dr. Verl Dicker office but follows here for cardiac catheter.  She reports no chest pain symptoms at this time.  His were reassessed at the hospital appear  to be well controlled.  This suggest that she may be on the treatment arm.  02/05/2020  Mrs. Randon is seen today in follow-up.  Overall she continues to do well.  She denies any recurrent chest pain.  She continues in the clear outcomes trial with bempedoic acid.  Her lipids have been blinded.  Blood pressure is excellent today 118/82.  She is on aspirin and Plavix dual antiplatelet therapy.  Since she had diffuse nonobstructive in-stent restenosis of the distal RCA by cath in 2020, medical therapy with dual antiplatelet therapy long-term was recommended.  EKG shows normal sinus rhythm at 61.  11/15/2020  Ms. Brunke returns today for follow-up.  She has come out of the clear trial.  I suspect she may have been on placebo.  Her lipids are decent however not at target LDL less than 70.  Total cholesterol now 167, triglycerides 79, HDL 54 and LDL 98.  Has been reporting some restlessness/leg cramping and in her legs at night.  She notes some intermittent edema as well.  06/07/2021  Ms. Spano is seen today in follow-up.  She is done very well on Repatha.  She  is tolerating it with marked improvement in her lipids.  LDL is gone down to 67 from 143.  Total cholesterol 141, triglycerides 100 HDL 56.  Blood pressure is excellent today 100/80.  EKG shows a sinus bradycardia.  She continues to get some intermittent jaw discomfort but is on oxcarbazepine for that with for presumed diagnosis of a trigeminal neuralgia.  07/27/2022  Ms. Milligan returns today for follow-up.  Her LDL recently had gone up based on recent lab work.  Apparently she was having difficulty getting the medication approved through [place].  She did however get it approved and has subsequently restarted her injections and hopefully her LDL will be back down into the 60s.  She had previously inquired about the potential weight loss medications.  She had managed to lose about 15 pounds or so back in November however that has gained that weight  back.  She does exercise regularly and tries to eat a healthy diet.  She has been in the prediabetic in range in the past.  She spoke with her primary care provider about this however there was some concerned about cost and availability.  However based on new indications for [facility], this includes cardiovascular risk reduction, she may be a good candidate and potentially could be approved for therapy if she was interested.  Wt Readings from Last 3 Encounters:  07/27/22 216 lb 3.2 oz (98.1 kg)  02/13/22 200 lb (90.7 kg)  10/18/21 215 lb 9.6 oz (97.8 kg)     Past Medical History:  Diagnosis Date   Anxiety    CAD (coronary artery disease)    Cardiac murmur    Depression    Gallstones    GERD (gastroesophageal reflux disease)    no current problems per patient 02/10/22   History of echocardiogram    Echo 5/18: EF 60-65, and normal wall motion, grade 1 diastolic dysfunction   History of PSVT (paroxysmal supraventricular tachycardia) 2018   monitor    History of smoking    Hyperlipidemia    Obesity    Old MI (myocardial infarction) 2006   Pre-syncope 2018    Current Outpatient Medications  Medication Sig Dispense Refill   acetaminophen (TYLENOL) 650 MG CR tablet Take 650-1,300 mg by mouth every 8 (eight) hours as needed for pain.     acyclovir (ZOVIRAX) 200 MG capsule Take 3 capsules now then 1 capsule 5 times a day (Patient taking differently: Take 200 mg by mouth daily as needed (fever blisters).) 38 capsule 3   aspirin EC 81 MG tablet Take 81 mg by mouth every morning.      buPROPion (WELLBUTRIN XL) 300 MG 24 hr tablet Take 1 tablet (300 mg total) by mouth in the morning. 90 tablet 4   clopidogrel (PLAVIX) 75 MG tablet Take 1 tablet (75 mg total) by mouth every morning. 90 tablet 3   Evolocumab (REPATHA SURECLICK) 140 MG/ML SOAJ Inject 1 dose into the skin every 14 days. 6 mL 3   metoprolol tartrate (LOPRESSOR) 25 MG tablet Take 0.5 tablets (12.5 mg total) by mouth 2 (two) times  daily. 90 tablet 0   nitroGLYCERIN (NITROSTAT) 0.4 MG SL tablet Place 1 tablet under the tongue every 5 minutes as needed for chest pain. 25 tablet 3   Oxcarbazepine (TRILEPTAL) 300 MG tablet Take one tablet by mouth twice daily 60 tablet 5   benzonatate (TESSALON) 100 MG capsule Take 1 capsule (100 mg total) by mouth 3 (three) times daily as needed for cough. 30  capsule 0   doxycycline (VIBRA-TABS) 100 MG tablet Take 1 tablet (100 mg total) by mouth 2 (two) times daily. 14 tablet 0   No current facility-administered medications for this visit.    Allergies:    Allergies  Allergen Reactions   Crestor [Rosuvastatin]     Myalgia    Lovastatin Other (See Comments)    Myalgias    Niaspan [Niacin Er] Itching   Penicillins Other (See Comments)    Child hood allergy Has patient had a PCN reaction causing immediate rash, facial/tongue/throat swelling, SOB or lightheadedness with hypotension: No  Has patient had a PCN reaction causing severe rash involving mucus membranes or skin necrosis: No Has patient had a PCN reaction that required hospitalization: No Has patient had a PCN reaction occurring within the last 10 years: No If all of the above answers are "NO", then may proceed with Cephalosporin use.   Pitavastatin Other (See Comments)    Joints ache   Zocor [Simvastatin] Other (See Comments)    Myalgias        Social History:  The patient  reports that she has quit smoking. Her smoking use included cigarettes. She has never used smokeless tobacco. She reports that she does not drink alcohol and does not use drugs.    Family history: No significant cardiac history  ROS:   Pertinent items noted in HPI and remainder of comprehensive ROS otherwise negative.   PHYSICAL EXAM: VS:  BP 130/78 (BP Location: Left Arm, Patient Position: Sitting, Cuff Size: Large)   Pulse (!) 58   Ht  (1.575 m)   Wt 216 lb 3.2 oz (98.1 kg)   SpO2 98%   BMI 39.54 kg/m  General appearance: alert  and no distress Neck: no carotid bruit, no JVD and thyroid not enlarged, symmetric, no tenderness/mass/nodules Lungs: clear to auscultation bilaterally Heart: regular rate and rhythm, S1, S2 normal, no murmur, click, rub or gallop Abdomen: soft, non-tender; bowel sounds normal; no masses,  no organomegaly Extremities: extremities normal, atraumatic, no cyanosis or edema Pulses: 2+ and symmetric Skin: Skin color, texture, turgor normal. No rashes or lesions Neurologic: Grossly normal Psych: Pleasant  EKG:  Sinus bradycardia at 58-personally reviewed  ASSESSMENT: Chest pain-cardiac cath 10/2018 (mild in-stent restenosis, patent PDA, normal LV function) no intervention, recommended long-term DAPT SVT - improved on BB Presyncope -resolved CAD - Acute MI in 2006 with single vessel RCA disease, status post overlapping BMS Dyslipidemia - statin intolerant, enrolled in clear outcomes trial Morbid obesity  PLAN: 1.  Mrs. Virrueta has overall had good control of her cholesterol though recently it went up when she was out of her Repatha due to insurance issues.  Now that has been restarted.  Given her history of prior MI and morbid obesity, based on the new guideline indications for [facility], I think she could benefit from this therapy with a cardiovascular risk reduction.  I will have her speak with our pharmacist Laural Golden, to see if she might qualify to start on therapy.  Plan follow-up with me annually or sooner as necessary.  [name], MD, [facility], FACP  [place]  [facility]  Medical Director of the Advanced Lipid Disorders &  Cardiovascular Risk Reduction Clinic Diplomate of the American Board of Clinical Lipidology Attending Cardiologist  Direct Dial: [phone]  Fax: [phone]  Website:  [email or web address]

## 2022-07-28 ENCOUNTER — Other Ambulatory Visit (HOSPITAL_COMMUNITY): Payer: Self-pay

## 2022-07-29 ENCOUNTER — Other Ambulatory Visit (HOSPITAL_COMMUNITY): Payer: Self-pay

## 2022-07-31 ENCOUNTER — Other Ambulatory Visit: Payer: Self-pay

## 2022-07-31 ENCOUNTER — Other Ambulatory Visit (HOSPITAL_COMMUNITY): Payer: Self-pay

## 2022-07-31 ENCOUNTER — Encounter: Payer: Self-pay | Admitting: Pharmacist

## 2022-07-31 ENCOUNTER — Telehealth: Payer: Self-pay | Admitting: Pharmacist

## 2022-07-31 ENCOUNTER — Other Ambulatory Visit: Payer: Self-pay | Admitting: Internal Medicine

## 2022-07-31 DIAGNOSIS — I2510 Atherosclerotic heart disease of native coronary artery without angina pectoris: Secondary | ICD-10-CM

## 2022-07-31 DIAGNOSIS — E782 Mixed hyperlipidemia: Secondary | ICD-10-CM

## 2022-07-31 MED ORDER — REPATHA SURECLICK 140 MG/ML ~~LOC~~ SOAJ
SUBCUTANEOUS | 3 refills | Status: DC
  Filled 2022-07-31 – 2022-08-08 (×2): qty 6, fill #0
  Filled 2022-09-18: qty 6, 84d supply, fill #0
  Filled 2022-12-01 – 2023-01-04 (×3): qty 6, 84d supply, fill #1
  Filled 2023-04-10: qty 6, 84d supply, fill #2

## 2022-07-31 NOTE — Telephone Encounter (Signed)
Message from Plan This drug/product is not covered under the pharmacy benefit. Prior Authorization is not available.

## 2022-07-31 NOTE — Telephone Encounter (Signed)
PA for [facility] subimtted. Dx code CAD  Key [id number]

## 2022-08-09 ENCOUNTER — Other Ambulatory Visit: Payer: Self-pay

## 2022-08-09 ENCOUNTER — Other Ambulatory Visit (HOSPITAL_COMMUNITY): Payer: Self-pay

## 2022-08-20 ENCOUNTER — Encounter: Payer: Self-pay | Admitting: Internal Medicine

## 2022-08-21 ENCOUNTER — Other Ambulatory Visit (HOSPITAL_COMMUNITY): Payer: Self-pay

## 2022-09-05 NOTE — Progress Notes (Unsigned)
NEUROLOGY FOLLOW UP OFFICE NOTE  Colleen Dillon [id number]  Assessment/Plan:   Right-sided trigeminal neuralgia - much improved. 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 Check BMP today Dr. Corliss Skains had recommended repeat imaging (MRA) around April/May, which has not been performed.  Advised patient to contact his office to have this scheduled.   Follow up 1 year     Subjective:  Colleen Dillon is a 57 year old female with CAD, HLD, depression/anxiety and history of PSVT who follows up for right sided trigeminal neuralgia.  UPDATE: Current medications:  Oxcarbazepine 300mg  BID, Wellbutrin XL 300mg  daily, Plavix, ASA 81mg , Lopressor   Face will hurt every night but manageable.    She underwent pipeline flow diversion device by Dr. Corliss Skains for treatment of the left para ophthalmic region aneurysm in November.  She is doing well.  BMP from 11/14 showed Na 141, K 3.4, Cl 114, CO2 22, glucose 133, BUN 11, CR 0.86, Ca 8.6, GFR >60   HISTORY: In September 2022, she began experiencing right sided facial pain.  She describes shooting/numbing pain and tingling from the right cheek radiating up to the side of the right eye.  Eventually, it also started radiating to the chin and right side of the mouth.  Lasts a few seconds.  Usually occurs spontaneously.  Sucking on a straw may trigger it, but otherwise not triggered by chewing, brushing teeth, talking or cold wind.  Occurs over 20 times a day.  She saw her dentist who found no dental abnormalities.  She was prescribed a prednisone taper which provided short-term relief  .  MRI of brain/trigeminal nerves with and without contrast and MRA of head on 01/30/2021 personally reviewed showed incidental 5 mm cavernous left ICA aneurysm but no findings to explain symptoms.  She was started on gabapentin.  She was prescribed 300mg  three times daily but is only able to tolerate 100mg  in AM and 300mg  in  PM.   Past medication:  gabapentin 100mg  in AM and 300mg  PM  PAST MEDICAL HISTORY: Past Medical History:  Diagnosis Date   Anxiety    CAD (coronary artery disease)    Cardiac murmur    Depression    Gallstones    GERD (gastroesophageal reflux disease)    History of echocardiogram    Echo 5/18: EF 60-65, and normal wall motion, grade 1 diastolic dysfunction   History of PSVT (paroxysmal supraventricular tachycardia) 2018   monitor    History of smoking    Hyperlipidemia    Obesity    Old MI (myocardial infarction) 2006   Pre-syncope 2018    MEDICATIONS: Current Outpatient Medications on File Prior to Visit  Medication Sig Dispense Refill   acetaminophen (TYLENOL) 325 MG tablet Take 325 mg by mouth as needed.     acyclovir (ZOVIRAX) 200 MG capsule Take 3 capsules now then 1 capsule 5 times a day 38 capsule 3   aspirin EC 81 MG tablet Take 81 mg by mouth every morning.      benzonatate (TESSALON PERLES) 100 MG capsule Take 2 capsules (200 mg total) by mouth 3 (three) times daily for cough 60 capsule 0   buPROPion (WELLBUTRIN XL) 300 MG 24 hr tablet Take 1 tablet by mouth in the morning daily 90 tablet 3   clopidogrel (PLAVIX) 75 MG tablet TAKE 1 TABLET BY MOUTH EVERY MORNING 90 tablet 3   COVID-19 At Home Antigen Test (CARESTART COVID-19 HOME TEST) KIT Use  as directed within package instructions (Patient not taking: Reported on 06/07/2021) 4 each 0   Evolocumab (REPATHA SURECLICK) 140 MG/ML SOAJ Inject 1 dose into the skin every 14 days. 6 mL 3   HYDROcodone bit-homatropine (HYCODAN) 5-1.5 MG/5ML syrup Take 2.5-5 mL by mouth up to every 6 hours as needed for cough (Patient not taking: Reported on 06/07/2021) 100 mL 0   metoprolol tartrate (LOPRESSOR) 25 MG tablet TAKE 1/2 TABLET BY MOUTH TWICE DAILY 90 tablet 3   nitroGLYCERIN (NITROSTAT) 0.4 MG SL tablet Place 1 tablet under the tongue every 5 minutes as needed for chest pain. 25 tablet 3   Oxcarbazepine (TRILEPTAL) 300 MG tablet Take  one tablet by mouth twice daily 60 tablet 4   predniSONE (DELTASONE) 20 MG tablet Taper: 3-3-3-2-2-2-1-1-1 Take once a day by mouth in the morning. 3 tablets for three days, 2 tablets for three days, then 1 tablet three days. (Patient not taking: Reported on 06/07/2021) 18 tablet 0   predniSONE (DELTASONE) 20 MG tablet Take 3 tablets each morning for 2 days, then 2 tablets daily for 2 days, then 1 tablet daily for 2 days. 12 tablet 0   valACYclovir (VALTREX) 1000 MG tablet As needed (Patient not taking: Reported on 06/07/2021)     No current facility-administered medications on file prior to visit.    ALLERGIES: Allergies  Allergen Reactions   Crestor [Rosuvastatin]     Myalgia    Lovastatin Other (See Comments)    Myalgias    Niaspan [Niacin Er] Itching   Penicillins Other (See Comments)    Child hood allergy Has patient had a PCN reaction causing immediate rash, facial/tongue/throat swelling, SOB or lightheadedness with hypotension: No  Has patient had a PCN reaction causing severe rash involving mucus membranes or skin necrosis: No Has patient had a PCN reaction that required hospitalization: No Has patient had a PCN reaction occurring within the last 10 years: No If all of the above answers are "NO", then may proceed with Cephalosporin use.   Pitavastatin Other (See Comments)    Joints ache   Zocor [Simvastatin] Other (See Comments)    Myalgias     FAMILY HISTORY: Family History  Problem Relation Age of Onset   Parkinsonism Mother    Aneurysm Mother 74       Dissected   Hypertension Mother 73   Arthritis Mother 64       Rheumatoid arthritis   Ulcers Father 37       Peptic ulcers   Cancer Father 21   Hypertension Brother 85   Hypertension Brother 34   Heart disease Maternal Grandfather 55      Objective:  Blood pressure (!) 114/50, pulse 73, height 5\' 3"  (1.6 m), weight 217 lb 12.8 oz (98.8 kg), SpO2 98 %. General: No acute distress.  Patient appears well-groomed.    Head:  Normocephalic/atraumatic Eyes:  Fundi examined but not visualized Neck: supple, no paraspinal tenderness, full range of motion Heart:  Regular rate and rhythm Neurological Exam: alert and oriented.  Speech fluent and not dysarthric, language intact.  CN II-XII intact. Bulk and tone normal, muscle strength 5/5 throughout.  Sensation to light touch intact.  Deep tendon reflexes 2+ throughout.  Finger to nose testing intact.  Gait normal, Romberg negative.   Colleen Millet, DO  CC: Blair Heys, MD

## 2022-09-06 ENCOUNTER — Encounter: Payer: Self-pay | Admitting: Neurology

## 2022-09-06 ENCOUNTER — Other Ambulatory Visit (INDEPENDENT_AMBULATORY_CARE_PROVIDER_SITE_OTHER): Payer: Commercial Managed Care - PPO

## 2022-09-06 ENCOUNTER — Ambulatory Visit: Payer: Commercial Managed Care - PPO | Admitting: Neurology

## 2022-09-06 VITALS — BP 114/50 | HR 73 | Ht 63.0 in | Wt 217.8 lb

## 2022-09-06 DIAGNOSIS — I671 Cerebral aneurysm, nonruptured: Secondary | ICD-10-CM | POA: Diagnosis not present

## 2022-09-06 DIAGNOSIS — G500 Trigeminal neuralgia: Secondary | ICD-10-CM | POA: Diagnosis not present

## 2022-09-06 NOTE — Patient Instructions (Signed)
Continue oxcarbazepine 300mg  twice daily.  Check BMP Follow up in one year

## 2022-09-07 LAB — BASIC METABOLIC PANEL
BUN: 14 mg/dL (ref 6–23)
CO2: 29 mEq/L (ref 19–32)
Calcium: 9.4 mg/dL (ref 8.4–10.5)
Chloride: 107 mEq/L (ref 96–112)
Creatinine, Ser: 1.28 mg/dL — ABNORMAL HIGH (ref 0.40–1.20)
GFR: 45.97 mL/min — ABNORMAL LOW (ref >60.00)
Glucose, Bld: 122 mg/dL — ABNORMAL HIGH (ref 70–99)
Potassium: 4.4 mEq/L (ref 3.5–5.1)
Sodium: 145 mEq/L (ref 135–145)

## 2022-09-12 DIAGNOSIS — G500 Trigeminal neuralgia: Secondary | ICD-10-CM | POA: Diagnosis not present

## 2022-09-12 DIAGNOSIS — H538 Other visual disturbances: Secondary | ICD-10-CM | POA: Diagnosis not present

## 2022-09-18 ENCOUNTER — Other Ambulatory Visit (HOSPITAL_COMMUNITY): Payer: Self-pay

## 2022-09-18 ENCOUNTER — Other Ambulatory Visit: Payer: Self-pay | Admitting: Neurology

## 2022-09-19 ENCOUNTER — Other Ambulatory Visit (HOSPITAL_COMMUNITY): Payer: Self-pay

## 2022-09-19 ENCOUNTER — Other Ambulatory Visit: Payer: Self-pay

## 2022-09-19 MED ORDER — OXCARBAZEPINE 300 MG PO TABS
300.0000 mg | ORAL_TABLET | Freq: Two times a day (BID) | ORAL | 5 refills | Status: DC
  Filled 2022-09-19: qty 60, 30d supply, fill #0
  Filled 2022-10-17: qty 60, 30d supply, fill #1
  Filled 2022-11-23: qty 60, 30d supply, fill #2
  Filled 2023-01-04: qty 60, 30d supply, fill #3
  Filled 2023-03-16: qty 60, 30d supply, fill #4
  Filled 2023-04-10: qty 60, 30d supply, fill #5

## 2022-09-19 MED ORDER — ACYCLOVIR 200 MG PO CAPS
ORAL_CAPSULE | ORAL | 0 refills | Status: AC
  Filled 2022-09-19: qty 38, 7d supply, fill #0

## 2022-09-20 ENCOUNTER — Other Ambulatory Visit: Payer: Self-pay

## 2022-09-20 ENCOUNTER — Other Ambulatory Visit (HOSPITAL_COMMUNITY): Payer: Self-pay

## 2022-09-21 ENCOUNTER — Other Ambulatory Visit: Payer: Self-pay

## 2022-09-28 ENCOUNTER — Other Ambulatory Visit (HOSPITAL_COMMUNITY): Payer: Self-pay | Admitting: Interventional Radiology

## 2022-09-28 DIAGNOSIS — I671 Cerebral aneurysm, nonruptured: Secondary | ICD-10-CM

## 2022-10-09 ENCOUNTER — Ambulatory Visit (HOSPITAL_COMMUNITY)
Admission: RE | Admit: 2022-10-09 | Discharge: 2022-10-09 | Disposition: A | Discharge status: Home / Self Care | Payer: Commercial Managed Care - PPO | Source: Ambulatory Visit | Attending: Interventional Radiology | Admitting: Interventional Radiology

## 2022-10-09 DIAGNOSIS — I671 Cerebral aneurysm, nonruptured: Secondary | ICD-10-CM | POA: Insufficient documentation

## 2022-10-16 ENCOUNTER — Other Ambulatory Visit (HOSPITAL_COMMUNITY): Payer: Self-pay

## 2022-10-17 ENCOUNTER — Other Ambulatory Visit: Payer: Self-pay | Admitting: Internal Medicine

## 2022-10-17 ENCOUNTER — Other Ambulatory Visit (HOSPITAL_COMMUNITY): Payer: Self-pay

## 2022-10-17 ENCOUNTER — Other Ambulatory Visit: Payer: Self-pay

## 2022-10-17 MED ORDER — NITROGLYCERIN 0.4 MG SL SUBL
0.4000 mg | SUBLINGUAL_TABLET | SUBLINGUAL | 3 refills | Status: AC | PRN
  Filled 2022-10-17: qty 25, 7d supply, fill #0
  Filled 2023-01-04: qty 25, 7d supply, fill #1
  Filled 2023-09-12: qty 25, 7d supply, fill #2

## 2022-10-17 MED ORDER — ACYCLOVIR 200 MG PO CAPS
ORAL_CAPSULE | ORAL | 0 refills | Status: AC
  Filled 2022-10-17: qty 38, 7d supply, fill #0

## 2022-10-18 ENCOUNTER — Other Ambulatory Visit: Payer: Self-pay

## 2022-10-22 ENCOUNTER — Other Ambulatory Visit: Payer: Self-pay | Admitting: Internal Medicine

## 2022-10-23 ENCOUNTER — Telehealth (HOSPITAL_COMMUNITY): Payer: Self-pay | Admitting: Student

## 2022-10-23 ENCOUNTER — Other Ambulatory Visit (HOSPITAL_COMMUNITY): Payer: Self-pay

## 2022-10-23 MED ORDER — METOPROLOL TARTRATE 25 MG PO TABS
12.5000 mg | ORAL_TABLET | Freq: Two times a day (BID) | ORAL | 3 refills | Status: DC
  Filled 2022-10-23: qty 90, 90d supply, fill #0
  Filled 2022-12-01 – 2023-03-16 (×3): qty 90, 90d supply, fill #1
  Filled 2023-05-08: qty 90, 90d supply, fill #2
  Filled 2023-09-12: qty 90, 90d supply, fill #3

## 2022-10-23 NOTE — Telephone Encounter (Signed)
Patient contacted today to discuss recent MRI/MRA head results. Patient will undergo repeat imaging with MRI/MRA head in 6 months. A scheduler from our office will call her with a date/time of her imaging study.   Patient also instructed to cut her dose of plavix in half (from 75 mg to 37.5) and to continue this dose until she runs out of her current prescription. Once she runs out she will discontinue Plavix but will remain on 81 mg aspirin.   Alwyn Ren, [place] [phone] 10/23/2022, 2:55 PM

## 2022-10-24 ENCOUNTER — Other Ambulatory Visit (HOSPITAL_COMMUNITY): Payer: Self-pay

## 2022-11-23 ENCOUNTER — Other Ambulatory Visit: Payer: Self-pay

## 2022-11-23 ENCOUNTER — Encounter: Payer: Self-pay | Admitting: Internal Medicine

## 2022-12-01 ENCOUNTER — Other Ambulatory Visit (HOSPITAL_COMMUNITY): Payer: Self-pay

## 2022-12-13 ENCOUNTER — Other Ambulatory Visit (HOSPITAL_COMMUNITY): Payer: Self-pay

## 2022-12-13 ENCOUNTER — Other Ambulatory Visit (HOSPITAL_BASED_OUTPATIENT_CLINIC_OR_DEPARTMENT_OTHER): Payer: Self-pay

## 2022-12-14 ENCOUNTER — Other Ambulatory Visit: Payer: Self-pay

## 2022-12-23 ENCOUNTER — Other Ambulatory Visit (HOSPITAL_COMMUNITY): Payer: Self-pay

## 2023-01-04 ENCOUNTER — Other Ambulatory Visit: Payer: Self-pay

## 2023-01-19 ENCOUNTER — Other Ambulatory Visit (HOSPITAL_COMMUNITY): Payer: Self-pay

## 2023-03-13 ENCOUNTER — Other Ambulatory Visit (HOSPITAL_COMMUNITY): Payer: Self-pay | Admitting: Interventional Radiology

## 2023-03-13 DIAGNOSIS — I671 Cerebral aneurysm, nonruptured: Secondary | ICD-10-CM

## 2023-03-16 ENCOUNTER — Other Ambulatory Visit (HOSPITAL_COMMUNITY): Payer: Self-pay

## 2023-03-29 ENCOUNTER — Ambulatory Visit (HOSPITAL_COMMUNITY)
Admission: RE | Admit: 2023-03-29 | Discharge: 2023-03-29 | Disposition: A | Discharge status: Home / Self Care | Payer: Commercial Managed Care - PPO | Source: Ambulatory Visit | Attending: Interventional Radiology | Admitting: Interventional Radiology

## 2023-03-29 DIAGNOSIS — I671 Cerebral aneurysm, nonruptured: Secondary | ICD-10-CM | POA: Insufficient documentation

## 2023-03-29 DIAGNOSIS — I6782 Cerebral ischemia: Secondary | ICD-10-CM | POA: Diagnosis not present

## 2023-03-29 DIAGNOSIS — Z8679 Personal history of other diseases of the circulatory system: Secondary | ICD-10-CM | POA: Diagnosis not present

## 2023-04-03 ENCOUNTER — Ambulatory Visit (HOSPITAL_COMMUNITY)
Admission: RE | Admit: 2023-04-03 | Discharge: 2023-04-03 | Disposition: A | Discharge status: Home / Self Care | Payer: Commercial Managed Care - PPO | Source: Ambulatory Visit | Attending: Interventional Radiology | Admitting: Interventional Radiology

## 2023-04-03 DIAGNOSIS — I671 Cerebral aneurysm, nonruptured: Secondary | ICD-10-CM | POA: Insufficient documentation

## 2023-04-03 DIAGNOSIS — Z0189 Encounter for other specified special examinations: Secondary | ICD-10-CM | POA: Diagnosis not present

## 2023-04-04 ENCOUNTER — Ambulatory Visit (HOSPITAL_COMMUNITY): Payer: Commercial Managed Care - PPO

## 2023-04-10 ENCOUNTER — Other Ambulatory Visit (HOSPITAL_COMMUNITY): Payer: Self-pay

## 2023-04-10 ENCOUNTER — Other Ambulatory Visit: Payer: Self-pay

## 2023-04-16 ENCOUNTER — Telehealth (HOSPITAL_COMMUNITY): Payer: Self-pay

## 2023-04-16 NOTE — Telephone Encounter (Signed)
Pt agreed to f/u in 6 months with an MRA. AB

## 2023-04-17 ENCOUNTER — Telehealth (HOSPITAL_COMMUNITY): Payer: Self-pay | Admitting: Student

## 2023-04-17 NOTE — Telephone Encounter (Signed)
 Recent imaging reviewed by Dr. Dolphus. Patient to return for follow up MRA Brain in 6 months. Patient was also advised 3 months ago to stop her Plavix  but continue on 81 mg aspirin . Patient has been off plavix  for 3 months and is doing well.   Warren Dais, AGACNP-BC 04/17/2023, 2:20 PM

## 2023-05-08 ENCOUNTER — Other Ambulatory Visit: Payer: Self-pay | Admitting: Neurology

## 2023-05-09 ENCOUNTER — Other Ambulatory Visit (HOSPITAL_COMMUNITY): Payer: Self-pay

## 2023-05-11 ENCOUNTER — Other Ambulatory Visit (HOSPITAL_COMMUNITY): Payer: Self-pay

## 2023-05-11 MED ORDER — OXCARBAZEPINE 300 MG PO TABS
300.0000 mg | ORAL_TABLET | Freq: Two times a day (BID) | ORAL | 5 refills | Status: AC
  Filled 2023-05-11: qty 60, 30d supply, fill #0
  Filled 2023-09-12: qty 60, 30d supply, fill #1
  Filled 2024-01-19: qty 60, 30d supply, fill #2
  Filled 2024-02-15: qty 60, 30d supply, fill #3
  Filled 2024-04-05: qty 60, 30d supply, fill #4
  Filled 2024-05-04: qty 60, 30d supply, fill #5

## 2023-05-22 ENCOUNTER — Other Ambulatory Visit: Payer: Self-pay

## 2023-05-22 ENCOUNTER — Other Ambulatory Visit (HOSPITAL_COMMUNITY): Payer: Self-pay

## 2023-05-22 DIAGNOSIS — R5383 Other fatigue: Secondary | ICD-10-CM | POA: Diagnosis not present

## 2023-05-22 DIAGNOSIS — R8290 Unspecified abnormal findings in urine: Secondary | ICD-10-CM | POA: Diagnosis not present

## 2023-05-22 DIAGNOSIS — J988 Other specified respiratory disorders: Secondary | ICD-10-CM | POA: Diagnosis not present

## 2023-05-22 MED ORDER — BENZONATATE 200 MG PO CAPS
200.0000 mg | ORAL_CAPSULE | Freq: Three times a day (TID) | ORAL | 0 refills | Status: DC | PRN
  Filled 2023-05-22 (×2): qty 90, 30d supply, fill #0

## 2023-05-22 MED ORDER — HYDROCODONE BIT-HOMATROP MBR 5-1.5 MG/5ML PO SOLN
5.0000 mL | Freq: Four times a day (QID) | ORAL | 0 refills | Status: DC | PRN
  Filled 2023-05-22 (×2): qty 120, 6d supply, fill #0

## 2023-05-22 MED ORDER — AZITHROMYCIN 250 MG PO TABS
ORAL_TABLET | ORAL | 0 refills | Status: DC
  Filled 2023-05-22 (×2): qty 6, 5d supply, fill #0

## 2023-05-23 ENCOUNTER — Other Ambulatory Visit: Payer: Self-pay

## 2023-05-24 ENCOUNTER — Other Ambulatory Visit: Payer: Self-pay

## 2023-05-25 ENCOUNTER — Other Ambulatory Visit: Payer: Self-pay | Admitting: Family Medicine

## 2023-05-25 DIAGNOSIS — R519 Headache, unspecified: Secondary | ICD-10-CM

## 2023-05-26 ENCOUNTER — Ambulatory Visit
Admission: RE | Admit: 2023-05-26 | Discharge: 2023-05-26 | Disposition: A | Discharge status: Home / Self Care | Payer: 59 | Source: Ambulatory Visit | Attending: Family Medicine | Admitting: Family Medicine

## 2023-05-26 DIAGNOSIS — R519 Headache, unspecified: Secondary | ICD-10-CM

## 2023-05-26 DIAGNOSIS — G43909 Migraine, unspecified, not intractable, without status migrainosus: Secondary | ICD-10-CM | POA: Diagnosis not present

## 2023-05-30 ENCOUNTER — Other Ambulatory Visit (HOSPITAL_COMMUNITY): Payer: Self-pay

## 2023-05-30 MED ORDER — CIPROFLOXACIN HCL 500 MG PO TABS
500.0000 mg | ORAL_TABLET | Freq: Two times a day (BID) | ORAL | 0 refills | Status: DC
  Filled 2023-05-30: qty 14, 7d supply, fill #0

## 2023-05-31 IMAGING — MR MR HEAD WO/W CM
16 of 19 series · 37 of 48 positions shown · IV contrast (gadavist)
Comparison: No pertinent prior exam.

None available.

CLINICAL DATA: Initial evaluation for right-sided trigeminal
neuralgia.

EXAM:
MRI HEAD WITHOUT AND WITH CONTRAST
MRA HEAD WITHOUT CONTRAST
TECHNIQUE: Multiplanar, multi-echo pulse sequences of the brain and surrounding
structures were acquired without and with intravenous contrast.
Angiographic images of the Circle of Willis were acquired using MRA
technique without intravenous contrast.
CONTRAST:  10mL GADAVIST GADOBUTROL 1 MMOL/ML IV SOLN

[Series 16: DWI · axial · 3.0mm · 1.36mm/px · z∈[-86,+89]mm · 5 of 118 slices shown (1 of 2)]
[im 1/118]
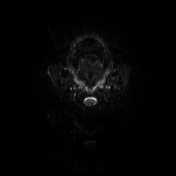
[im 30/118]
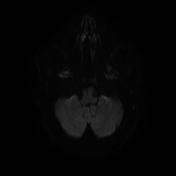
[im 59/118]
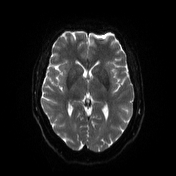
[im 88/118]
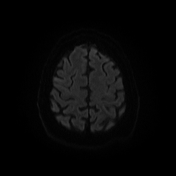
[im 118/118]
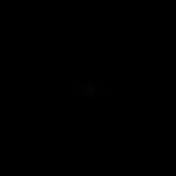

[Series 17: DWI · axial · 3.0mm · 1.36mm/px · z∈[-86,+89]mm · 2 of 60 slices shown (2 of 2)]
[im 1/60]
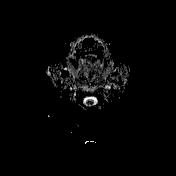
[im 60/60]
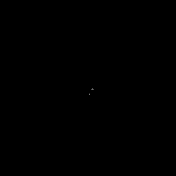

[Series 18: T1 · sagittal · 5.0mm · 0.75mm/px · 1 of 25 slices shown (1 of 4)]
[im 1/25]
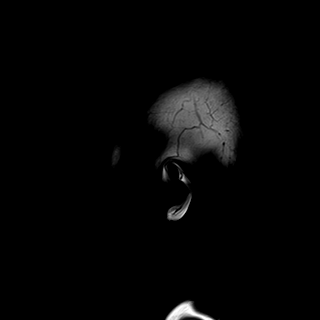

[Series 19: STIR · axial · 3.0mm · 0.31mm/px · z∈[-154,+5]mm · 2 of 41 slices shown]
[im 1/41]
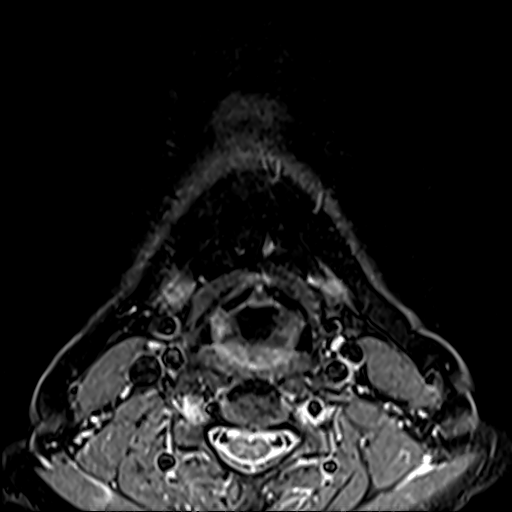
[im 41/41]
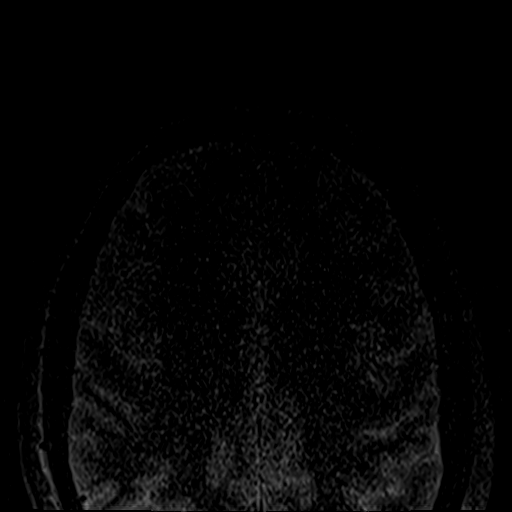

[Series 20: T2 · axial · 5.0mm · 0.62mm/px · 1 of 26 slices shown]
[im 1/26]
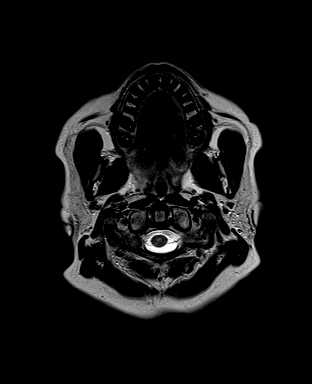

[Series 21: swi_images · axial · 3.0mm · 0.75mm/px · 1 of 60 slices shown]
[im 1/60]
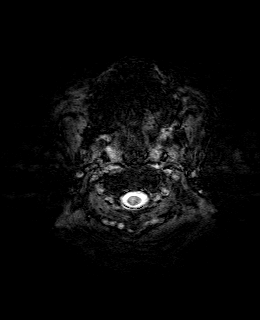

[Series 23: FLAIR · axial · 3.0mm · 0.47mm/px · z∈[-91,+77]mm · 2 of 57 slices shown]
[im 1/57]
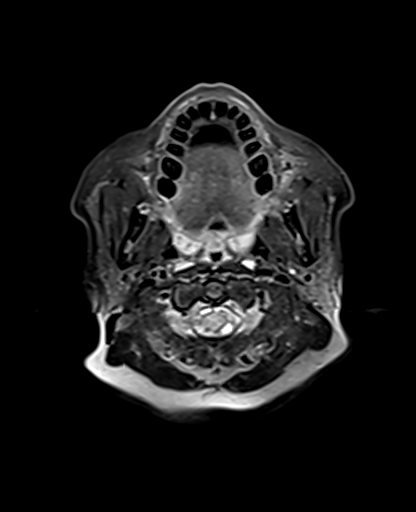
[im 57/57]
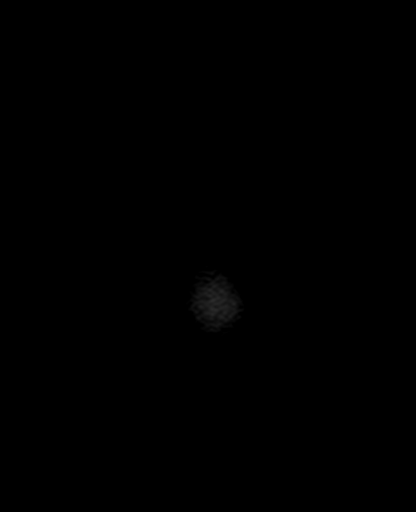

[Series 25: T1 · axial · 1.0mm · 0.94mm/px · z∈[-98,+77]mm · 7 of 176 slices shown (2 of 4)]
[im 1/176]
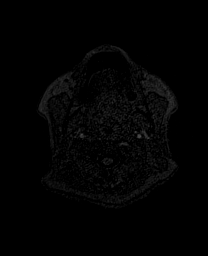
[im 30/176]
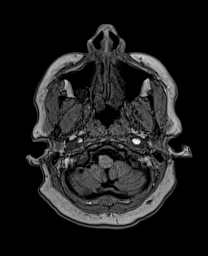
[im 59/176]
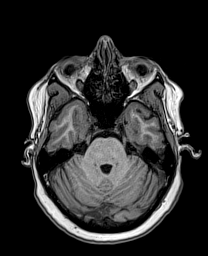
[im 88/176]
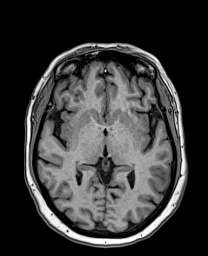
[im 117/176]
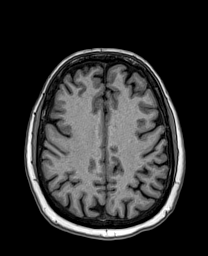
[im 146/176]
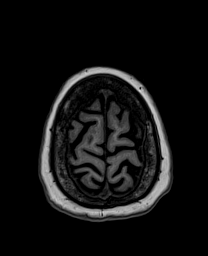
[im 176/176]
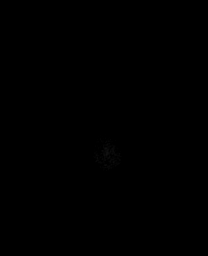

[Series 26: T1 · coronal · 3.0mm · 0.35mm/px · 2 of 42 slices shown (3 of 4)]
[im 1/42]
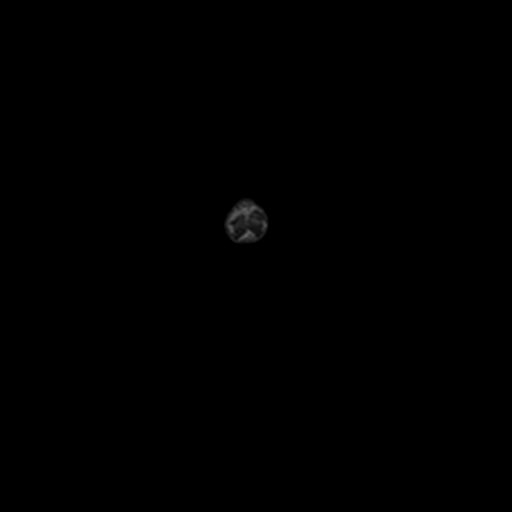
[im 42/42]
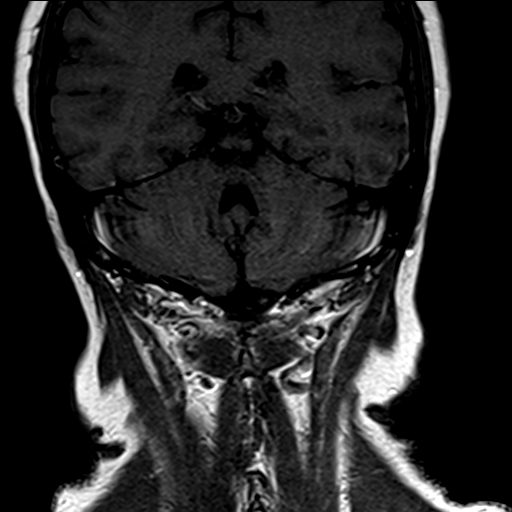

[Series 27: T1 · axial · 3.0mm · 0.35mm/px · 1 of 36 slices shown (4 of 4)]
[im 1/36]
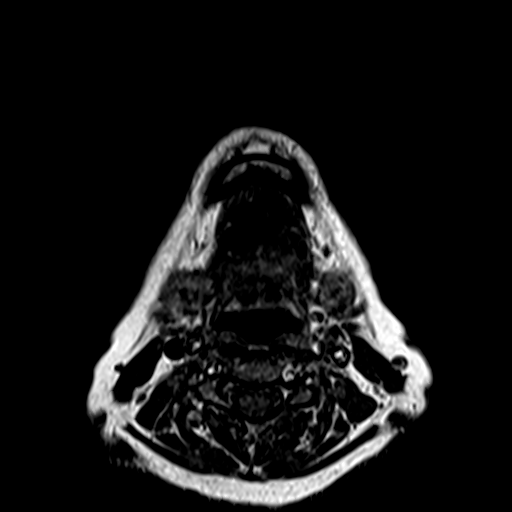

[Series 30: T2 post-contrast · coronal · 5.0mm · 0.57mm/px · 1 of 32 slices shown]
[im 1/32]
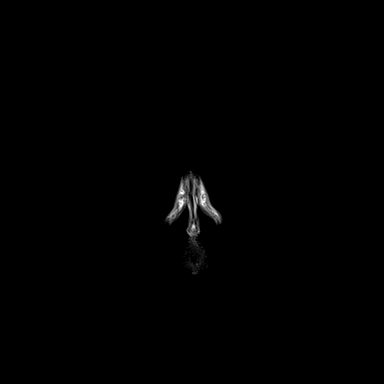

[Series 31: T1 fat-sat post-contrast · axial · 3.0mm · 0.35mm/px · 1 of 36 slices shown (1 of 2)]
[im 1/36]
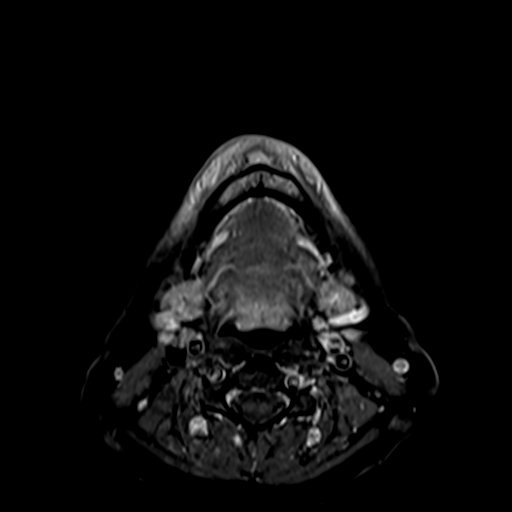

[Series 32: T1 fat-sat post-contrast · coronal · 3.0mm · 0.35mm/px · 2 of 42 slices shown (2 of 2)]
[im 1/42]
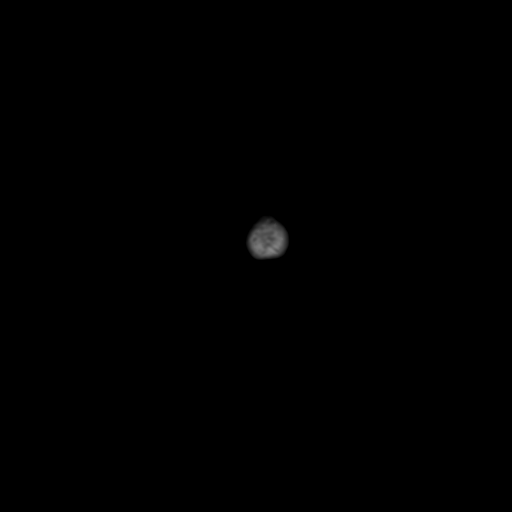
[im 42/42]
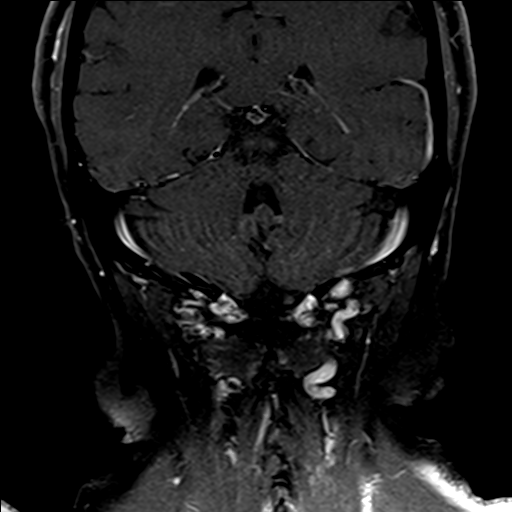

[Series 33: T1 post-contrast · axial · 1.0mm · 0.94mm/px · z∈[-98,+77]mm · 7 of 176 slices shown (1 of 3)]
[im 1/176]
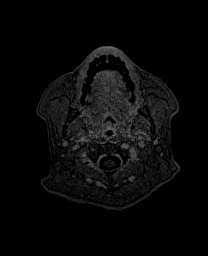
[im 30/176]
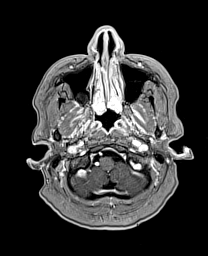
[im 59/176]
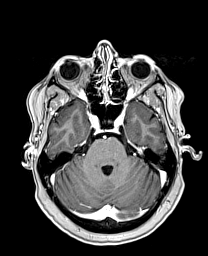
[im 88/176]
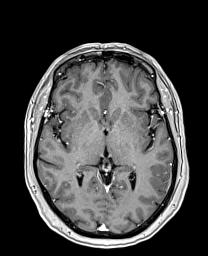
[im 117/176]
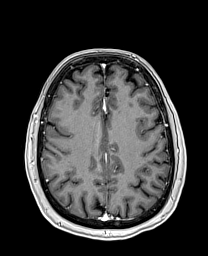
[im 146/176]
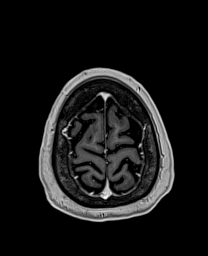
[im 176/176]
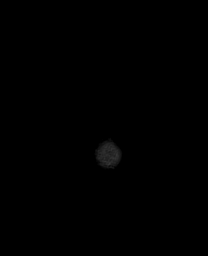

[Series 34: T1 post-contrast · coronal · 5.0mm · 0.43mm/px · 1 of 32 slices shown (2 of 3)]
[im 1/32]
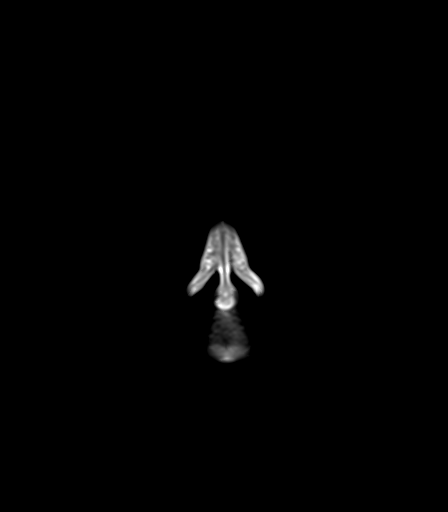

[Series 35: T1 post-contrast · sagittal · 5.0mm · 0.75mm/px · 1 of 24 slices shown (3 of 3)]
[im 1/24]
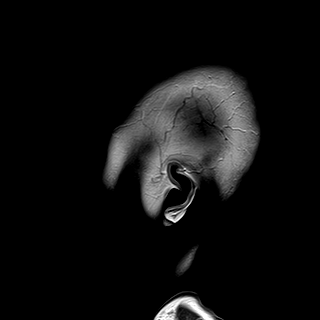

[37 of 48 positions shown; findings below may reference images not displayed]

FINDINGS: MRI HEAD FINDINGS

Brain: Cerebral volume within normal limits. Few punctate foci of
FLAIR hyperintensity noted involving the periventricular and deep
white matter both cerebral hemispheres, nonspecific, but overall
extremely mild in nature, and felt to be within normal limits for
age.

No evidence for acute or subacute infarct. Gray-white matter
differentiation maintained. No encephalomalacia to suggest chronic
cortical infarction. No evidence for acute or chronic intracranial
hemorrhage.

Dedicated thin sections were performed through the skull base to
evaluate the trigeminal nerves. Both trigeminal nerves seen coursing
normally from the pons into Meckel's cave. No abnormality about the
pons or brainstem. No visible vascular structures seen in close
proximity with either nerve root entry zone. Both trigeminal nerves
are symmetric and normal in appearance without thickening or
abnormal enhancement. No abnormal enhancement. No abnormality about
Meckel's cave. No abnormality about the infratemporal fossa. No
structural findings to explain patient's symptoms.

No other mass lesion, mass effect or midline shift. No hydrocephalus
or extra-axial fluid collection. Pituitary gland and suprasellar
region within normal limits. Midline structures intact and normal.

Vascular: Major intracranial vascular flow voids are well
maintained.

Skull and upper cervical spine: Craniocervical junction within
normal limits. Bone marrow signal intensity normal. No scalp soft
tissue abnormality.

Sinuses/Orbits: Globes and orbital soft tissues within normal
limits. Few small retention cyst noted within the right ethmoidal
air cells and right maxillary sinus. Paranasal sinuses are otherwise
clear. No mastoid effusion. Inner ear structures grossly normal.

Other: None.

MRA HEAD FINDINGS

Anterior circulation: Visualized distal cervical segments of the
internal carotid arteries are patent with antegrade flow. Petrous,
cavernous, and supraclinoid segments patent without stenosis. 5 mm
outpouching arising from the cavernous left ICA consistent with a
paraophthalmic aneurysm (series 9, image 107). This is directed
superiorly. A1 segments patent bilaterally. Normal anterior
communicating artery complex. Anterior cerebral arteries patent to
their distal aspects without stenosis. No M1 stenosis or occlusion.
Normal MCA bifurcations. Distal MCA branches perfused and symmetric.

Posterior circulation: Both vertebral arteries patent to the
vertebrobasilar junction without stenosis. Both PICA origins patent
and normal. Basilar patent to its distal aspect without stenosis.
Superior cerebellar arteries patent and normal bilaterally. Both
PCAs primarily supplied via the basilar well perfused to their
distal aspects.

Anatomic variants: None significant.
IMPRESSION: 1. Normal MRI of the brain and trigeminal nerves. No structural
findings to explain patient's symptoms identified.
2. 5 mm cavernous left ICA aneurysm.
3. Otherwise unremarkable and normal intracranial MRA.

## 2023-06-11 ENCOUNTER — Other Ambulatory Visit: Payer: Self-pay | Admitting: *Deleted

## 2023-06-11 DIAGNOSIS — E782 Mixed hyperlipidemia: Secondary | ICD-10-CM

## 2023-07-02 ENCOUNTER — Other Ambulatory Visit (HOSPITAL_COMMUNITY): Payer: Self-pay

## 2023-07-02 DIAGNOSIS — Z1331 Encounter for screening for depression: Secondary | ICD-10-CM | POA: Diagnosis not present

## 2023-07-02 DIAGNOSIS — I2510 Atherosclerotic heart disease of native coronary artery without angina pectoris: Secondary | ICD-10-CM | POA: Diagnosis not present

## 2023-07-02 DIAGNOSIS — E8889 Other specified metabolic disorders: Secondary | ICD-10-CM | POA: Diagnosis not present

## 2023-07-02 DIAGNOSIS — E66812 Obesity, class 2: Secondary | ICD-10-CM | POA: Diagnosis not present

## 2023-07-02 DIAGNOSIS — G4709 Other insomnia: Secondary | ICD-10-CM | POA: Diagnosis not present

## 2023-07-02 DIAGNOSIS — E7800 Pure hypercholesterolemia, unspecified: Secondary | ICD-10-CM | POA: Diagnosis not present

## 2023-07-02 DIAGNOSIS — Z6835 Body mass index (BMI) 35.0-35.9, adult: Secondary | ICD-10-CM | POA: Diagnosis not present

## 2023-07-02 DIAGNOSIS — E559 Vitamin D deficiency, unspecified: Secondary | ICD-10-CM | POA: Diagnosis not present

## 2023-07-02 DIAGNOSIS — R7303 Prediabetes: Secondary | ICD-10-CM | POA: Diagnosis not present

## 2023-07-02 MED ORDER — WEGOVY 0.25 MG/0.5ML ~~LOC~~ SOAJ
0.2500 mg | SUBCUTANEOUS | 0 refills | Status: DC
  Filled 2023-07-02: qty 2, 28d supply, fill #0

## 2023-07-03 ENCOUNTER — Other Ambulatory Visit (HOSPITAL_COMMUNITY): Payer: Self-pay

## 2023-08-03 DIAGNOSIS — E782 Mixed hyperlipidemia: Secondary | ICD-10-CM | POA: Diagnosis not present

## 2023-08-03 DIAGNOSIS — Z0000 Encounter for general adult medical examination without abnormal findings: Secondary | ICD-10-CM | POA: Diagnosis not present

## 2023-08-03 DIAGNOSIS — I2510 Atherosclerotic heart disease of native coronary artery without angina pectoris: Secondary | ICD-10-CM | POA: Diagnosis not present

## 2023-08-15 ENCOUNTER — Other Ambulatory Visit: Payer: Self-pay | Admitting: Family Medicine

## 2023-08-15 DIAGNOSIS — Z1231 Encounter for screening mammogram for malignant neoplasm of breast: Secondary | ICD-10-CM

## 2023-08-16 ENCOUNTER — Ambulatory Visit
Admission: RE | Admit: 2023-08-16 | Discharge: 2023-08-16 | Disposition: A | Discharge status: Home / Self Care | Source: Ambulatory Visit

## 2023-08-16 DIAGNOSIS — Z1231 Encounter for screening mammogram for malignant neoplasm of breast: Secondary | ICD-10-CM

## 2023-09-10 ENCOUNTER — Ambulatory Visit: Payer: Commercial Managed Care - PPO | Admitting: Neurology

## 2023-09-12 ENCOUNTER — Other Ambulatory Visit: Payer: Self-pay | Admitting: Internal Medicine

## 2023-09-12 ENCOUNTER — Other Ambulatory Visit (HOSPITAL_COMMUNITY): Payer: Self-pay

## 2023-09-12 DIAGNOSIS — I2510 Atherosclerotic heart disease of native coronary artery without angina pectoris: Secondary | ICD-10-CM

## 2023-09-12 DIAGNOSIS — E782 Mixed hyperlipidemia: Secondary | ICD-10-CM

## 2023-09-13 ENCOUNTER — Other Ambulatory Visit (HOSPITAL_COMMUNITY): Payer: Self-pay

## 2023-09-13 ENCOUNTER — Other Ambulatory Visit: Payer: Self-pay

## 2023-09-13 MED ORDER — REPATHA SURECLICK 140 MG/ML ~~LOC~~ SOAJ
SUBCUTANEOUS | 3 refills | Status: AC
  Filled 2023-09-13 – 2023-11-14 (×5): qty 6, 84d supply, fill #0
  Filled 2023-11-19: qty 6, 81d supply, fill #0
  Filled 2023-11-21 (×2): qty 6, 84d supply, fill #0
  Filled 2024-01-19 – 2024-02-15 (×2): qty 6, 84d supply, fill #1
  Filled 2024-04-05 – 2024-05-04 (×2): qty 6, 84d supply, fill #2

## 2023-09-21 DIAGNOSIS — E782 Mixed hyperlipidemia: Secondary | ICD-10-CM | POA: Diagnosis not present

## 2023-09-22 LAB — LIPID PANEL
Chol/HDL Ratio: 2.8 ratio (ref 0.0–4.4)
Cholesterol, Total: 148 mg/dL (ref 100–199)
HDL: 53 mg/dL (ref >39)
LDL Chol Calc (NIH): 74 mg/dL (ref 0–99)
Triglycerides: 116 mg/dL (ref 0–149)
VLDL Cholesterol Cal: 21 mg/dL (ref 5–40)

## 2023-09-23 ENCOUNTER — Ambulatory Visit (HOSPITAL_BASED_OUTPATIENT_CLINIC_OR_DEPARTMENT_OTHER): Payer: Self-pay | Admitting: Internal Medicine

## 2023-09-25 ENCOUNTER — Encounter: Payer: Self-pay | Admitting: Internal Medicine

## 2023-09-25 ENCOUNTER — Ambulatory Visit: Attending: Internal Medicine | Admitting: Internal Medicine

## 2023-09-25 VITALS — BP 104/75 | HR 71 | Ht 62.0 in | Wt 221.4 lb

## 2023-09-25 DIAGNOSIS — M791 Myalgia, unspecified site: Secondary | ICD-10-CM

## 2023-09-25 DIAGNOSIS — I10 Essential (primary) hypertension: Secondary | ICD-10-CM

## 2023-09-25 DIAGNOSIS — I252 Old myocardial infarction: Secondary | ICD-10-CM | POA: Diagnosis not present

## 2023-09-25 DIAGNOSIS — T466X5D Adverse effect of antihyperlipidemic and antiarteriosclerotic drugs, subsequent encounter: Secondary | ICD-10-CM

## 2023-09-25 DIAGNOSIS — I2510 Atherosclerotic heart disease of native coronary artery without angina pectoris: Secondary | ICD-10-CM | POA: Diagnosis not present

## 2023-09-25 DIAGNOSIS — E785 Hyperlipidemia, unspecified: Secondary | ICD-10-CM | POA: Diagnosis not present

## 2023-09-25 NOTE — Patient Instructions (Signed)
 Medication Instructions:  Your physician recommends that you continue on your current medications as directed. Please refer to the Current Medication list given to you today.  *If you need a refill on your cardiac medications before your next appointment, please call your pharmacy*  Follow-Up: At [facility], you and your health needs are our priority.  As part of our continuing mission to provide you with exceptional heart care, our providers are all part of one team.  This team includes your primary Cardiologist (physician) and Advanced Practice Providers or APPs (Physician Assistants and Nurse Practitioners) who all work together to provide you with the care you need, when you need it.  Your next appointment:   1 year with Dr. Maximo Spar

## 2023-09-25 NOTE — Progress Notes (Unsigned)
 Date:  09/25/2023   ID:  [name], DOB 09-07-1965, MRN [id number]  PCP:  Colleen Opal, DO  Primary Cardiologist:  Colleen Dillon  CC: No complaints  History of Present Illness: Colleen Dillon is a 58 y.o. female history of obesity, coronary artery disease, hyperlipidemia, hypertension, formerly followed by Colleen Dillon - here to establish care with me today. Patient had acute DMI in 2006 with single vessel RCA disease with placement of overlapping bare metal stents. A recatheterization for repeat angina the same day and required subsequent DES stenting of the distal RCA and PLA branches peeling occluded at that time. Last catheterization was in 2010 showed 30-50% narrowing of the acute margin. The overlapping DES stents and IVUS interrogation showed good residual lumen. His chronic PDA occlusion with collaterals from the left. She ruled out for MI in July 2012 last nuclear stress test was 10/17/2010 showed no definite inducible or reversible ischemia the EF of 58%.  Last echo was April 2014 showed ejection fraction of 50-55% with normal wall motion. Is moderate mitral valve regurgitation. Currently works as a Engineer, civil (consulting) at [employer].   She last saw Colleen Sow, PA-C for symptoms of fatigue and chest pain this past summer and underwent a nuclear stress test on 10/02/2012 which was negative for ischemia. She exercised to 11.2 metabolic equivalents and had no chest pain. Overall she has done well and recently had a lipid profile which demonstrated LDL cholesterol of 122.  She was previously on Crestor 10 mg daily but was intolerant of this due to side effects including myalgias. She is also previously failed Zocor and lovastatin.  I saw Colleen Dillon back in the office today. She is doing well although she reports some morning headaches. She recently had some weight gain which she reports is due to stress eating. She's been under a lot of stress as her husband has some unusual neurologic disorder and her  daughter recently had surgery and is requiring attention and dressing changes. She denies any chest pain or worsening shortness of breath.  Colleen Dillon returns today for follow-up. She reports a recently she's had some worsening shortness of breath. She's had a walk a longer distance up a hill to get to work and notes that she feels more short of breath. She does exercise about 3 times a week, but generally does not do any significant incline on the treadmill. She has managed to lose little bit of weight recently due to dietary changes as her husband is at goal on a strict diet. She denies any chest pain. Her EKG is essentially unchanged with sinus rhythm and occasional PVCs.  03/02/2016  Colleen Dillon was seen today in follow-up. She reports no significant change in her shortness of breath. She has recently noticed an increase in her cholesterol. She has been intolerant to statins in the past. She was referred for evaluation of this. Last year her LDL was 105 with a particle number of 1121. Apparently this was higher recently from her primary care provider, however I do not have those results to review. She is requesting recommendations for treatment.  08/15/2016  Colleen Dillon returns today for follow-up. She recently saw Colleen Ferrier, PA-C for near syncope. She had 2 episodes of rapid heart rate associated with graying out of her vision in one time she fell down to her knees and nearly passed out. 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 She was also placed on a monitor which she is currently wearing. I personally reviewed up loads from that monitor indicating several episodes of paroxysmal supraventricular tachycardia. Most of these episodes lasted less than 10 beats but likely represent the episode she had that caused  her to be presyncopal. In the past she's been using low-dose metoprolol  as needed but has not taken any recently.  11/20/2016  Colleen Dillon returns today for follow-up. Overall she seems to be doing well. Heart rate appears well controlled on beta blocker. She feels like her metoprolol  is helped with her fast heart rates. She denies any chest pain or worsening shortness of breath. She does have a history of dyslipidemia and given her history of coronary disease should be on treatment for that. Unfortunately she's been intolerant to 4 different statins at different doses as well as niacin in the past. We discussed new medications including the PCSK9 inhibitors which she may be a candidate for. However, it is been several years since she has last had a lipid profile.  08/28/2017  Colleen Dillon was seen today in follow-up.  Recently she has been having difficulty with her gallbladder.  She is actually scheduled to have a cholecystectomy.  She is here for preoperative clearance.  Her last year she is done well without any chest pain or worsening shortness of breath.  She has been statin intolerant and we discussed starting PCSK9 inhibitors, but in the interim she enrolled in the CLEAR trial with Dr. Godfrey office.  We do not know whether she is on active medication or placebo.  This will continue to enroll into next year.  Denies any recurrent palpitations.  Of note she is on 162 mg dose of aspirin  and Plavix .  02/04/2019  Colleen Dillon returns today for follow-up.  She recently underwent left heart catheterization in July 2020 for chest pain and was found to have diffuse nonobstructive in-stent restenosis of the distal RCA with a patent PDA and normal LV function.  Medical therapy was recommended.  She continues to be enrolled in the clear outcomes trial through Dr. Godfrey office but follows here for cardiac catheter.  She reports no chest pain symptoms at this time.  His were reassessed at the hospital appear  to be well controlled.  This suggest that she may be on the treatment arm.  02/05/2020  Colleen Dillon is seen today in follow-up.  Overall she continues to do well.  She denies any recurrent chest pain.  She continues in the clear outcomes trial with bempedoic acid.  Her lipids have been blinded.  Blood pressure is excellent today 118/82.  She is on aspirin  and Plavix  dual antiplatelet therapy.  Since she had diffuse nonobstructive in-stent restenosis of the distal RCA by cath in 2020, medical therapy with dual antiplatelet therapy long-term was recommended.  EKG shows normal sinus rhythm at 61.  11/15/2020  Colleen Dillon returns today for follow-up.  She has come out of the clear trial.  I suspect she may have been on placebo.  Her lipids are decent however not at target LDL less than 70.  Total cholesterol now 167, triglycerides 79, HDL 54 and LDL 98.  Has been reporting some restlessness/leg cramping and in her legs at night.  She notes some intermittent edema as well.  06/07/2021  Colleen Dillon is seen today in follow-up.  She is done very well on Repatha .  She  is tolerating it with marked improvement in her lipids.  LDL is gone down to 67 from 143.  Total cholesterol 141, triglycerides 100 HDL 56.  Blood pressure is excellent today 100/80.  EKG shows a sinus bradycardia.  She continues to get some intermittent jaw discomfort but is on oxcarbazepine  for that with for presumed diagnosis of a trigeminal neuralgia.  07/27/2022  Colleen Dillon returns today for follow-up.  Her LDL recently had gone up based on recent lab work.  Apparently she was having difficulty getting the medication approved through [place].  She did however get it approved and has subsequently restarted her injections and hopefully her LDL will be back down into the 60s.  She had previously inquired about the potential weight loss medications.  She had managed to lose about 15 pounds or so back in November however that has gained that weight  back.  She does exercise regularly and tries to eat a healthy diet.  She has been in the prediabetic in range in the past.  She spoke with her primary care provider about this however there was some concerned about cost and availability.  However based on new indications for Wegovy , this includes cardiovascular risk reduction, she may be a good candidate and potentially could be approved for therapy if she was interested.  Wt Readings from Last 3 Encounters:  09/25/23 221 lb 6.4 oz (100.4 kg)  09/06/22 217 lb 12.8 oz (98.8 kg)  07/27/22 216 lb 3.2 oz (98.1 kg)     Past Medical History:  Diagnosis Date   Anxiety    CAD (coronary artery disease)    Cardiac murmur    Depression    Gallstones    GERD (gastroesophageal reflux disease)    no current problems per patient 02/10/22   History of echocardiogram    Echo 5/18: EF 60-65, and normal wall motion, grade 1 diastolic dysfunction   History of PSVT (paroxysmal supraventricular tachycardia) 2018   monitor    History of smoking    Hyperlipidemia    Obesity    Old MI (myocardial infarction) 2006   Pre-syncope 2018    Current Outpatient Medications  Medication Sig Dispense Refill   acetaminophen  (TYLENOL ) 650 MG CR tablet Take 650-1,300 mg by mouth every 8 (eight) hours as needed for pain.     acyclovir  (ZOVIRAX ) 200 MG capsule Take 3 capsules now then 1 capsule 5 times a day 38 capsule 0   aspirin  EC 81 MG tablet Take 81 mg by mouth every morning.      Evolocumab  (REPATHA  SURECLICK) 140 MG/ML SOAJ Inject 1 dose into the skin every 14 days. 6 mL 3   metoprolol  tartrate (LOPRESSOR ) 25 MG tablet Take 1/2 tablet (12.5 mg total) by mouth 2 times daily. 90 tablet 3   nitroGLYCERIN  (NITROSTAT ) 0.4 MG SL tablet Place 1 tablet under the tongue every 5 minutes as needed for chest pain. 25 tablet 3   Oxcarbazepine  (TRILEPTAL ) 300 MG tablet Take 1 tablet (300 mg total) by mouth 2 (two) times daily. 60 tablet 5   azithromycin  (ZITHROMAX ) 250 MG  tablet Take 2 tablets on the first day, then 1 tablet daily for 4 days. (Patient not taking: Reported on 09/25/2023) 6 tablet 0   benzonatate  (TESSALON ) 200 MG capsule Take 1 capsule (200 mg total) by mouth 3 (three) times daily as needed for cough. (Patient not taking: Reported on 09/25/2023) 90 capsule 0   buPROPion  (WELLBUTRIN  XL) 300 MG 24 hr tablet Take 1 tablet (  300 mg total) by mouth in the morning. (Patient not taking: Reported on 09/25/2023) 90 tablet 4   ciprofloxacin  (CIPRO ) 500 MG tablet Take 1 tablet (500 mg total) by mouth every 12 (twelve) hours for 7 days (Patient not taking: Reported on 09/25/2023) 14 tablet 0   clopidogrel  (PLAVIX ) 75 MG tablet Take 1 tablet (75 mg total) by mouth every morning. (Patient not taking: Reported on 09/25/2023) 90 tablet 3   HYDROcodone  bit-homatropine (HYCODAN) 5-1.5 MG/5ML syrup Take 5 mLs by mouth every 6 (six) hours as needed for cough. (Patient not taking: Reported on 09/25/2023) 120 mL 0   Semaglutide -Weight Management (WEGOVY ) 0.25 MG/0.5ML SOAJ Inject 0.25 mg into the skin once a week. (Patient not taking: Reported on 09/25/2023) 2 mL 0   No current facility-administered medications for this visit.    Allergies:    Allergies  Allergen Reactions   Crestor [Rosuvastatin]     Myalgia    Lovastatin Other (See Comments)    Myalgias    Niaspan [Niacin Er (Antihyperlipidemic)] Itching   Penicillins Other (See Comments)    Child hood allergy Has patient had a PCN reaction causing immediate rash, facial/tongue/throat swelling, SOB or lightheadedness with hypotension: No  Has patient had a PCN reaction causing severe rash involving mucus membranes or skin necrosis: No Has patient had a PCN reaction that required hospitalization: No Has patient had a PCN reaction occurring within the last 10 years: No If all of the above answers are NO, then may proceed with Cephalosporin use.   Pitavastatin  Other (See Comments)    Joints ache   Zocor [Simvastatin]  Other (See Comments)    Myalgias        Social History:  The patient  reports that she has quit smoking. Her smoking use included cigarettes. She has never used smokeless tobacco. She reports that she does not drink alcohol and does not use drugs.    Family history: No significant cardiac history  ROS:   Pertinent items noted in HPI and remainder of comprehensive ROS otherwise negative.   PHYSICAL EXAM: VS:  BP 104/75 (BP Location: Left Arm, Patient Position: Sitting, Cuff Size: Large)   Pulse 71   Ht 5' 2 (1.575 m)   Wt 221 lb 6.4 oz (100.4 kg)   SpO2 96%   BMI 40.49 kg/m  General appearance: alert and no distress Neck: no carotid bruit, no JVD and thyroid  not enlarged, symmetric, no tenderness/mass/nodules Lungs: clear to auscultation bilaterally Heart: regular rate and rhythm, S1, S2 normal, no murmur, click, rub or gallop Abdomen: soft, non-tender; bowel sounds normal; no masses,  no organomegaly Extremities: extremities normal, atraumatic, no cyanosis or edema Pulses: 2+ and symmetric Skin: Skin color, texture, turgor normal. No rashes or lesions Neurologic: Grossly normal Psych: Pleasant  EKG:  EKG Interpretation Date/Time:  Tuesday September 25 2023 15:56:51 EDT Ventricular Rate:  61 PR Interval:  152 QRS Duration:  88 QT Interval:  426 QTC Calculation: 428 R Axis:   10  Text Interpretation: Normal sinus rhythm No significant change since last tracing Confirmed by Colleen Dillon Kent [phone]) on 09/25/2023 4:07:21 PM    ASSESSMENT: Chest pain-cardiac cath 10/2018 (mild in-stent restenosis, patent PDA, normal LV function) no intervention, recommended long-term DAPT SVT - improved on BB Presyncope -resolved CAD - Acute MI in 2006 with single vessel RCA disease, status post overlapping BMS Dyslipidemia - statin intolerant, enrolled in clear outcomes trial Morbid obesity  PLAN: 1.  Colleen Dillon has overall had good control of  her cholesterol though recently it went up  when she was out of her Repatha  due to insurance issues.  Now that has been restarted.  Given her history of prior MI and morbid obesity, based on the new guideline indications for Wegovy , I think she could benefit from this therapy with a cardiovascular risk reduction.  I will have her speak with our pharmacist Chris Pavero, to see if she might qualify to start on therapy.  Plan follow-up with me annually or sooner as necessary.  Vinie KYM Maxcy, MD, [facility], FACP  [place]  [facility]  Medical Director of the [facility] Lipid Disorders &  Cardiovascular Risk Reduction Clinic Diplomate of the American Board of Clinical Lipidology Attending Cardiologist  Direct Dial: [phone]  Fax: [phone]  Website:  [email or web address]

## 2023-09-27 ENCOUNTER — Other Ambulatory Visit (HOSPITAL_COMMUNITY): Payer: Self-pay

## 2023-09-28 ENCOUNTER — Encounter (HOSPITAL_COMMUNITY): Payer: Self-pay | Admitting: Interventional Radiology

## 2023-10-02 ENCOUNTER — Other Ambulatory Visit (HOSPITAL_COMMUNITY): Payer: Self-pay | Admitting: Interventional Radiology

## 2023-10-02 DIAGNOSIS — I671 Cerebral aneurysm, nonruptured: Secondary | ICD-10-CM

## 2023-10-08 ENCOUNTER — Ambulatory Visit (HOSPITAL_COMMUNITY)
Admission: RE | Admit: 2023-10-08 | Discharge: 2023-10-08 | Disposition: A | Discharge status: Home / Self Care | Source: Ambulatory Visit | Attending: Interventional Radiology | Admitting: Interventional Radiology

## 2023-10-08 DIAGNOSIS — I671 Cerebral aneurysm, nonruptured: Secondary | ICD-10-CM | POA: Insufficient documentation

## 2023-10-08 DIAGNOSIS — R519 Headache, unspecified: Secondary | ICD-10-CM | POA: Diagnosis not present

## 2023-10-10 ENCOUNTER — Other Ambulatory Visit (HOSPITAL_COMMUNITY): Payer: Self-pay | Admitting: Interventional Radiology

## 2023-10-10 ENCOUNTER — Telehealth (HOSPITAL_COMMUNITY): Payer: Self-pay

## 2023-10-10 DIAGNOSIS — I671 Cerebral aneurysm, nonruptured: Secondary | ICD-10-CM

## 2023-10-10 NOTE — Telephone Encounter (Signed)
-----   Message from Carlin DELENA Griffon sent at 10/09/2023  1:06 PM EDT ----- Regarding: MRA f/u Dr. Dolphus said that this patient's aneurysm, while treated, and stable on yesterday's MRA, is still not fully treated. The MRA notes residual blood flow within the aneurysm, despite the flow diverter Dr. Dolphus placed. He suggest one of 2 options:  1) 2nd treatment? If patient is amenable, or wanting to discuss this further, please schedule consult with Dr. Dolphus ASAP.   2) otherwise, by default, please follow-up with 6 month MRA of the brain.

## 2023-10-11 ENCOUNTER — Encounter (INDEPENDENT_AMBULATORY_CARE_PROVIDER_SITE_OTHER): Payer: Self-pay | Admitting: Adult Health

## 2023-10-11 ENCOUNTER — Ambulatory Visit (INDEPENDENT_AMBULATORY_CARE_PROVIDER_SITE_OTHER): Admitting: Adult Health

## 2023-10-11 VITALS — BP 130/74 | HR 79 | Temp 97.8°F | Ht 62.0 in | Wt 216.0 lb

## 2023-10-11 DIAGNOSIS — Z0289 Encounter for other administrative examinations: Secondary | ICD-10-CM

## 2023-10-11 DIAGNOSIS — R5383 Other fatigue: Secondary | ICD-10-CM | POA: Diagnosis not present

## 2023-10-11 DIAGNOSIS — I2510 Atherosclerotic heart disease of native coronary artery without angina pectoris: Secondary | ICD-10-CM | POA: Diagnosis not present

## 2023-10-11 DIAGNOSIS — E782 Mixed hyperlipidemia: Secondary | ICD-10-CM

## 2023-10-11 DIAGNOSIS — Z789 Other specified health status: Secondary | ICD-10-CM | POA: Diagnosis not present

## 2023-10-11 DIAGNOSIS — R7989 Other specified abnormal findings of blood chemistry: Secondary | ICD-10-CM | POA: Diagnosis not present

## 2023-10-11 DIAGNOSIS — Z6839 Body mass index (BMI) 39.0-39.9, adult: Secondary | ICD-10-CM

## 2023-10-11 NOTE — Progress Notes (Signed)
 Office: [phone]  /  Fax: [phone]   Initial Visit    Colleen Dillon was seen in clinic today to evaluate for obesity. She is interested in losing weight to improve overall health and reduce the risk of weight related complications. She presents today to review program treatment options, initial physical assessment, and evaluation.     She was referred by: Self-Referral  When asked what else they would like to accomplish? She states: Adopt a healthier eating pattern and lifestyle, Improve energy levels and physical activity, Improve existing medical conditions, Reduce number of medications, Improve quality of life, and She wants to become healthier and improve her stamina  When asked how has your weight affected you? She states: Contributed to medical problems, Contributed to orthopedic problems or mobility issues, Having fatigue, Having poor endurance, and Problems with eating patterns  Weight history: Weight gain since Hysterectomy approx 20 years ago  Highest weight: 212 lbs  Some associated conditions: Hypertension and Hyperlipidemia  Contributing factors: family history of obesity, disruption of circadian rhythm / sleep disordered breathing, consumption of processed foods, use of obesogenic medications: Beta-blockers, moderate to high levels of stress, reduced physical activity, menopause, hectic pace of life, and need for convenient foods  Weight promoting medications identified: Beta-blockers  Prior weight loss attempts: Low Carb and Intermittent fasting  Current nutrition plan: None  Current level of physical activity: NEAT  Current or previous pharmacotherapy: None  Response to medication: Never tried medications   Past medical history includes:   Past Medical History:  Diagnosis Date   Anxiety    CAD (coronary artery disease)    Cardiac murmur    Depression    Gallstones    GERD (gastroesophageal reflux disease)    no current problems per patient  02/10/22   History of echocardiogram    Echo 5/18: EF 60-65, and normal wall motion, grade 1 diastolic dysfunction   History of PSVT (paroxysmal supraventricular tachycardia) 2018   monitor    History of smoking    Hyperlipidemia    Obesity    Old MI (myocardial infarction) 2006   Pre-syncope 2018     Objective    BP 130/74   Pulse 79   Temp 97.8 F (36.6 C)   Ht 5' 2 (1.575 m)   Wt 216 lb (98 kg)   SpO2 98%   BMI 39.51 kg/m  She was weighed on the bioimpedance scale: Body mass index is 39.51 kg/m.  Body Fat%:48.7, Visceral Fat Rating:15, Weight trend over the last 12 months: Unchanged  General:  Alert, oriented and cooperative. Patient is in no acute distress.  Respiratory: Normal respiratory effort, no problems with respiration noted   Gait: able to ambulate independently  Mental Status: Normal mood and affect. Normal behavior. Normal judgment and thought content.   DIAGNOSTIC DATA REVIEWED:  BMET    Component Value Date/Time   NA 145 09/06/2022 1529   NA 144 03/01/2021 1525   K 4.4 09/06/2022 1529   CL 107 09/06/2022 1529   CO2 29 09/06/2022 1529   GLUCOSE 122 (H) 09/06/2022 1529   BUN 14 09/06/2022 1529   BUN 12 03/01/2021 1525   CREATININE 1.28 (H) 09/06/2022 1529   CREATININE 0.99 08/15/2012 1130   CALCIUM  9.4 09/06/2022 1529   GFRNONAA >60 02/14/2022 0500   GFRAA >60 10/04/2018 0555   Lab Results  Component Value Date   HGBA1C 5.7 (H) 08/18/2013   HGBA1C 5.7 (H) 10/16/2010   No results found for: INSULIN  CBC    Component Value Date/Time   WBC 7.0 02/14/2022 0500   RBC 3.58 (L) 02/14/2022 0500   HGB 11.2 (L) 02/14/2022 0500   HGB 13.0 07/24/2016 1000   HCT 33.1 (L) 02/14/2022 0500   HCT 39.1 07/24/2016 1000   PLT 272 02/14/2022 0500   PLT 307 07/24/2016 1000   MCV 92.5 02/14/2022 0500   MCV 90 07/24/2016 1000   MCH 31.3 02/14/2022 0500   MCHC 33.8 02/14/2022 0500   RDW 12.8 02/14/2022 0500   RDW 13.1 07/24/2016 1000    Iron/TIBC/Ferritin/ %Sat No results found for: IRON, TIBC, FERRITIN, IRONPCTSAT Lipid Panel     Component Value Date/Time   CHOL 148 09/21/2023 0937   CHOL 178 06/18/2014 0000   TRIG 116 09/21/2023 0937   TRIG 85 06/18/2014 0000   HDL 53 09/21/2023 0937   HDL 56 06/18/2014 0000   CHOLHDL 2.8 09/21/2023 0937   CHOLHDL 3.5 10/04/2018 0555   VLDL 27 10/04/2018 0555   LDLCALC 74 09/21/2023 0937   LDLCALC 105 (H) 06/18/2014 0000   Hepatic Function Panel     Component Value Date/Time   PROT 7.0 02/20/2020 1614   ALBUMIN 4.0 02/20/2020 1614   AST 18 02/20/2020 1614   ALT 12 02/20/2020 1614   ALKPHOS 67 02/20/2020 1614   BILITOT 0.5 02/20/2020 1614   BILIDIR 0.1 10/03/2018 1257   IBILI 0.5 10/03/2018 1257      Component Value Date/Time   TSH 2.860 06/24/2013 0800     Assessment and Plan   Coronary artery disease involving native coronary artery of native heart without angina pectoris  Other fatigue  Mixed hyperlipidemia  Statin intolerance  Elevated serum creatinine  Morbid obesity (HCC), STARTING BMI 39.5   Assessment and Plan          ESTABLISH WITH HWW   Obesity Treatment / Action Plan:  Patient will work on garnering support from family and friends to begin weight loss journey. Will work on eliminating or reducing the presence of highly palatable, calorie dense foods in the home. Will complete provided nutritional and psychosocial assessment questionnaire before the next appointment. Will be scheduled for indirect calorimetry to determine resting energy expenditure in a fasting state.  This will allow us  to create a reduced calorie, high-protein meal plan to promote loss of fat mass while preserving muscle mass. Counseled on the health benefits of losing 5%-15% of total body weight. Was counseled on nutritional approaches to weight loss and benefits of reducing processed foods and consuming plant-based foods and high quality protein as part of  nutritional weight management. Was counseled on pharmacotherapy and role as an adjunct in weight management.   Obesity Education Performed Today:  She was weighed on the bioimpedance scale and results were discussed and documented in the synopsis.  We discussed obesity as a disease and the importance of a more detailed evaluation of all the factors contributing to the disease.  We discussed the importance of long term lifestyle changes which include nutrition, exercise and behavioral modifications as well as the importance of customizing this to her specific health and social needs.  We discussed the benefits of reaching a healthier weight to alleviate the symptoms of existing conditions and reduce the risks of the biomechanical, metabolic and psychological effects of obesity.  We reviewed the four pillars of obesity medicine and importance of using a multimodal approach.  We reviewed the basic principles in weight management.   Dorissa N Frazee appears to be  in the action stage of change and states they are ready to start intensive lifestyle modifications and behavioral modifications.  I have spent 30 minutes in the care of the patient today including: 5 minutes before the visit reviewing and preparing the chart. 20 minutes face-to-face assessing and reviewing listed medical problems as outlined in obesity care plan, providing nutritional and behavioral counseling on topics outlined in the obesity care plan, counseling regarding anti-obesity medication as outlined in obesity care plan, independently interpreting test results and goals of care, as described in assessment and plan, and reviewing and discussing biometric information and progress 5 minutes after the visit updating chart and documentation of encounter.  Reviewed by clinician on day of visit: allergies, medications, problem list, medical history, surgical history, family history, social history, and previous encounter notes  pertinent to obesity diagnosis.  Valda Christenson d. Sarahlynn Cisnero, NP-C

## 2023-10-15 ENCOUNTER — Ambulatory Visit (HOSPITAL_COMMUNITY)

## 2023-10-16 ENCOUNTER — Ambulatory Visit (HOSPITAL_COMMUNITY)
Admission: RE | Admit: 2023-10-16 | Discharge: 2023-10-16 | Disposition: A | Discharge status: Home / Self Care | Source: Ambulatory Visit | Attending: Interventional Radiology | Admitting: Interventional Radiology

## 2023-10-16 DIAGNOSIS — F418 Other specified anxiety disorders: Secondary | ICD-10-CM | POA: Diagnosis not present

## 2023-10-16 DIAGNOSIS — I2510 Atherosclerotic heart disease of native coronary artery without angina pectoris: Secondary | ICD-10-CM | POA: Diagnosis not present

## 2023-10-16 DIAGNOSIS — K8020 Calculus of gallbladder without cholecystitis without obstruction: Secondary | ICD-10-CM | POA: Diagnosis not present

## 2023-10-16 DIAGNOSIS — I671 Cerebral aneurysm, nonruptured: Secondary | ICD-10-CM

## 2023-10-16 DIAGNOSIS — K219 Gastro-esophageal reflux disease without esophagitis: Secondary | ICD-10-CM | POA: Diagnosis not present

## 2023-10-17 HISTORY — PX: IR RADIOLOGIST EVAL & MGMT: IMG5224

## 2023-10-25 ENCOUNTER — Other Ambulatory Visit (HOSPITAL_COMMUNITY): Payer: Self-pay

## 2023-10-31 ENCOUNTER — Institutional Professional Consult (permissible substitution) (INDEPENDENT_AMBULATORY_CARE_PROVIDER_SITE_OTHER): Admitting: Adult Health

## 2023-11-01 DIAGNOSIS — I729 Aneurysm of unspecified site: Secondary | ICD-10-CM | POA: Diagnosis not present

## 2023-11-01 DIAGNOSIS — R519 Headache, unspecified: Secondary | ICD-10-CM | POA: Diagnosis not present

## 2023-11-05 ENCOUNTER — Encounter: Payer: Self-pay | Admitting: Family Medicine

## 2023-11-05 ENCOUNTER — Other Ambulatory Visit: Payer: Self-pay | Admitting: Family Medicine

## 2023-11-05 ENCOUNTER — Other Ambulatory Visit (HOSPITAL_COMMUNITY): Payer: Self-pay

## 2023-11-05 DIAGNOSIS — R0989 Other specified symptoms and signs involving the circulatory and respiratory systems: Secondary | ICD-10-CM

## 2023-11-08 ENCOUNTER — Other Ambulatory Visit (HOSPITAL_COMMUNITY): Payer: Self-pay

## 2023-11-08 ENCOUNTER — Inpatient Hospital Stay
Admission: RE | Admit: 2023-11-08 | Discharge: 2023-11-08 | Discharge status: Home / Self Care | Source: Ambulatory Visit | Attending: Family Medicine | Admitting: Family Medicine

## 2023-11-08 DIAGNOSIS — R519 Headache, unspecified: Secondary | ICD-10-CM | POA: Diagnosis not present

## 2023-11-08 DIAGNOSIS — R0989 Other specified symptoms and signs involving the circulatory and respiratory systems: Secondary | ICD-10-CM

## 2023-11-09 ENCOUNTER — Other Ambulatory Visit (HOSPITAL_COMMUNITY): Payer: Self-pay

## 2023-11-13 ENCOUNTER — Telehealth (HOSPITAL_COMMUNITY): Payer: Self-pay

## 2023-11-13 ENCOUNTER — Other Ambulatory Visit: Payer: Self-pay

## 2023-11-13 ENCOUNTER — Other Ambulatory Visit (HOSPITAL_COMMUNITY): Payer: Self-pay

## 2023-11-14 ENCOUNTER — Other Ambulatory Visit (HOSPITAL_COMMUNITY): Payer: Self-pay

## 2023-11-14 ENCOUNTER — Telehealth: Payer: Self-pay | Admitting: Pharmacy Technician

## 2023-11-14 NOTE — Telephone Encounter (Signed)
 Pharmacy Patient Advocate Encounter  Received notification from [facility] that Prior Authorization for Repatha  has been APPROVED from 11/14/23 to 11/12/24   PA #/Case ID/Reference #: [id number]

## 2023-11-14 NOTE — Telephone Encounter (Signed)
 Pharmacy Patient Advocate Encounter   Received notification from Pt Calls Messages that prior authorization for Repatha  is required/requested.   Insurance verification completed.   The patient is insured through [facility] .   Per test claim: PA required; PA submitted to above mentioned insurance via Latent Key/confirmation #/[id number] Status is pending

## 2023-11-19 ENCOUNTER — Other Ambulatory Visit (HOSPITAL_COMMUNITY): Payer: Self-pay

## 2023-11-20 ENCOUNTER — Encounter (HOSPITAL_COMMUNITY): Payer: Self-pay

## 2023-11-21 ENCOUNTER — Other Ambulatory Visit (HOSPITAL_COMMUNITY): Payer: Self-pay

## 2023-11-21 ENCOUNTER — Other Ambulatory Visit: Payer: Self-pay

## 2023-12-06 NOTE — Progress Notes (Signed)
 [facility] - Clinic Note  12/18/2023   CHIEF COMPLAINT Patient presents for Retina Evaluation  HISTORY OF PRESENT ILLNESS: Colleen Dillon is a 58 y.o. female who presents to the clinic today for:  HPI     Retina Evaluation   In both eyes.  This started 2 years ago.  Duration of 2 years.  I, the attending physician,  performed the HPI with the patient and updated documentation appropriately.        Comments   Patient here for Retina Evaluation. Referred by Dr Auston. Patient states had an aneruysm about 2 years ago. Has headaches. Doctor trying to see what the cause is. Has trouble with vision sees fine. Light are bothersome. More in the day than car lights at night. Has new glasses. Had Lasix years ago. Had to redo 3 or 4 times. Has pain behind OS. Not severe.      Last edited by Valdemar Rogue, MD on 12/25/2023  9:45 PM.     Pt had a CT max face done on 08.07.25 for eval of headaches. Incidentally found tortuosity of the intraorbital optic nerve and radiology recommended funduscopic examination.   Pt states 2 years ago she had an aneurysm which is still not completely healed. Has recurrent headaches, looking for the solution and ruling out vision issues. Having light sensitivity and issues driving visually. Odd distortions. FBS reported in OS, uses genteal and ointment at bedtime. Hx of Lasik OU-several times in left.  No reports of HTN or diabetes.    Referring physician: Auston Opal, DO [address] [place],  [place] [address]  HISTORICAL INFORMATION:  Selected notes from the MEDICAL RECORD NUMBER Referred by Dr. Auston for funduscopic exam following incidental finding of optic nerve tortuosity on CT scan LEE:  Ocular Hx- PMH-   CURRENT MEDICATIONS: No current outpatient medications on file. (Ophthalmic Drugs)   No current facility-administered medications for this visit. (Ophthalmic Drugs)   Current Outpatient Medications (Other)   Medication Sig   acetaminophen  (TYLENOL ) 650 MG CR tablet Take 650-1,300 mg by mouth every 8 (eight) hours as needed for pain.   acyclovir  (ZOVIRAX ) 200 MG capsule Take 3 capsules now then 1 capsule 5 times a day   aspirin  EC 81 MG tablet Take 81 mg by mouth every morning.    Evolocumab  (REPATHA  SURECLICK) 140 MG/ML SOAJ Inject 140 mg into the skin every 14 days.   metoprolol  tartrate (LOPRESSOR ) 25 MG tablet Take 1/2 tablet (12.5 mg total) by mouth 2 times daily.   nitroGLYCERIN  (NITROSTAT ) 0.4 MG SL tablet Place 1 tablet under the tongue every 5 minutes as needed for chest pain.   Oxcarbazepine  (TRILEPTAL ) 300 MG tablet Take 1 tablet (300 mg total) by mouth 2 (two) times daily.   azithromycin  (ZITHROMAX ) 250 MG tablet Take 2 tablets on the first day, then 1 tablet daily for 4 days. (Patient not taking: Reported on 12/18/2023)   benzonatate  (TESSALON ) 200 MG capsule Take 1 capsule (200 mg total) by mouth 3 (three) times daily as needed for cough. (Patient not taking: Reported on 12/18/2023)   buPROPion  (WELLBUTRIN  XL) 300 MG 24 hr tablet Take 1 tablet (300 mg total) by mouth in the morning. (Patient not taking: Reported on 12/18/2023)   ciprofloxacin  (CIPRO ) 500 MG tablet Take 1 tablet (500 mg total) by mouth every 12 (twelve) hours for 7 days (Patient not taking: Reported on 12/18/2023)   HYDROcodone  bit-homatropine (HYCODAN) 5-1.5 MG/5ML syrup Take 5  mLs by mouth every 6 (six) hours as needed for cough. (Patient not taking: Reported on 12/18/2023)   Semaglutide -Weight Management (WEGOVY ) 0.25 MG/0.5ML SOAJ Inject 0.25 mg into the skin once a week. (Patient not taking: Reported on 12/18/2023)   No current facility-administered medications for this visit. (Other)   REVIEW OF SYSTEMS: ROS   Positive for: Neurological, Eyes Last edited by Orval Asberry RAMAN, COA on 12/18/2023  8:31 AM.     ALLERGIES Allergies  Allergen Reactions   Crestor [Rosuvastatin]     Myalgia    Lovastatin Other (See  Comments)    Myalgias    Niaspan [Niacin Er (Antihyperlipidemic)] Itching   Penicillins Other (See Comments)    Child hood allergy Has patient had a PCN reaction causing immediate rash, facial/tongue/throat swelling, SOB or lightheadedness with hypotension: No  Has patient had a PCN reaction causing severe rash involving mucus membranes or skin necrosis: No Has patient had a PCN reaction that required hospitalization: No Has patient had a PCN reaction occurring within the last 10 years: No If all of the above answers are NO, then may proceed with Cephalosporin use.   Pitavastatin  Other (See Comments)    Joints ache   Zocor [Simvastatin] Other (See Comments)    Myalgias    PAST MEDICAL HISTORY Past Medical History:  Diagnosis Date   Anxiety    CAD (coronary artery disease)    Cardiac murmur    Depression    Gallstones    GERD (gastroesophageal reflux disease)    no current problems per patient 02/10/22   History of echocardiogram    Echo 5/18: EF 60-65, and normal wall motion, grade 1 diastolic dysfunction   History of PSVT (paroxysmal supraventricular tachycardia) 2018   monitor    History of smoking    Hyperlipidemia    Obesity    Old MI (myocardial infarction) 2006   Pre-syncope 2018   Past Surgical History:  Procedure Laterality Date   ABDOMINAL HYSTERECTOMY  2002   CARDIAC CATHETERIZATION  05/18/2004   90% RCA stenosis, stented w/ a [employer] Liberte 5.0x76mm bare metal stent - postdilated at 10atmx10sec, stented w/ a second [employer] Liberte 4.5x45mm bare metal stent at 15atmx30sec, resulting in TIMI III flow.   CARDIAC CATHETERIZATION  09/08/2004   No intervention - continue medical therapy   CARDIAC CATHETERIZATION  03/21/2005   Distal RCA 50-60% in-stent restenosis, stented w/ a DES Scimed Taxus Express II 3.5x76mm stent - postdilated at 20atmx31sec, resulting in TIMI III flow-60% stenosis reduced to 0-10%   CARDIAC CATHETERIZATION  05/23/2005    No intervention - continue medical therapy   CARDIAC CATHETERIZATION  11/02/2008   No intervention - continue medical therapy   CARDIAC SURGERY  2006   stent placement   CARDIOVASCULAR STRESS TEST  10/02/2012   No significant ST segment change suggestive of ischemia   CESAREAN SECTION  1987   CHOLECYSTECTOMY N/A 08/31/2017   Procedure: LAPAROSCOPIC CHOLECYSTECTOMY WITH INTRAOPERATIVE CHOLANGIOGRAM;  Surgeon: Rubin Calamity, MD;  Location: [facility] OR;  Service: General;  Laterality: N/A;   COLONOSCOPY     IR 3D INDEPENDENT WKST  02/13/2022   IR ANGIO INTRA EXTRACRAN SEL COM CAROTID INNOMINATE UNI R MOD SED  02/13/2022   IR ANGIO INTRA EXTRACRAN SEL INTERNAL CAROTID UNI L MOD SED  02/13/2022   IR ANGIO VERTEBRAL SEL VERTEBRAL UNI R MOD SED  02/13/2022   IR ANGIOGRAM FOLLOW UP STUDY  02/15/2022   IR CT HEAD LTD  02/15/2022  IR NEURO EACH ADD'L AFTER BASIC UNI LEFT (MS)  02/15/2022   IR RADIOLOGIST EVAL & MGMT  01/27/2022   IR RADIOLOGIST EVAL & MGMT  03/01/2022   IR RADIOLOGIST EVAL & MGMT  10/17/2023   IR TRANSCATH/EMBOLIZ  02/13/2022   IR US  GUIDE VASC ACCESS RIGHT  02/13/2022   LASIK     LEFT HEART CATH AND CORONARY ANGIOGRAPHY N/A 10/03/2018   Procedure: LEFT HEART CATH AND CORONARY ANGIOGRAPHY;  Surgeon: Swaziland, Peter M, MD;  Location: [facility] INVASIVE CV LAB;  Service: Cardiovascular;  Laterality: N/A;   RADIOLOGY WITH ANESTHESIA N/A 02/13/2022   Procedure: aneurysm embolization;  Surgeon: Dolphus Carrion, MD;  Location: [facility] OR;  Service: Radiology;  Laterality: N/A;   TEAR DUCT PROBING Left 05/02/2017   Procedure: NASAL LACRIMAL DUCT EXPLORATION WITH LACRICATH BALLOON CANALICULOPLASTY LEFT EYE;  Surgeon: Jacques Sharper, MD;  Location: [facility];  Service: Ophthalmology;  Laterality: Left;   TRANSTHORACIC ECHOCARDIOGRAM  07/30/2012   EF 50-55%, moderate mitral regurg   TUBAL LIGATION     FAMILY HISTORY Family History  Problem Relation Age of Onset    Parkinsonism Mother    Aneurysm Mother 27       Dissected   Hypertension Mother 59   Arthritis Mother 29       Rheumatoid arthritis   Ulcers Father 89       Peptic ulcers   Cancer Father 51   Hypertension Brother 48   Hypertension Brother 11   Heart disease Maternal Grandfather 35   SOCIAL HISTORY Social History   Tobacco Use   Smoking status: Former    Types: Cigarettes   Smokeless tobacco: Never   Tobacco comments:    quit smoking cigarettes > 40 years ago  Vaping Use   Vaping status: Never Used  Substance Use Topics   Alcohol use: No   Drug use: No       OPHTHALMIC EXAM:  Base Eye Exam     Visual Acuity (Snellen - Linear)       Right Left   Dist cc 20/25 +2 20/25   Dist ph cc 20/20 NI    Correction: Glasses         Tonometry (Tonopen, 8:24 AM)       Right Left   Pressure 13 14         Pupils       Dark Light Shape React APD   Right 3 2 Round Brisk None   Left 3 2 Round Brisk None         Visual Fields (Counting fingers)       Left Right    Full Full         Extraocular Movement       Right Left    Full, Ortho Full, Ortho         Neuro/Psych     Oriented x3: Yes   Mood/Affect: Normal         Dilation     Both eyes: 1.0% Mydriacyl, 2.5% Phenylephrine  @ 8:24 AM           Slit Lamp and Fundus Exam     Slit Lamp Exam       Right Left   Lids/Lashes Dermatochalasis Dermatochalasis   Conjunctiva/Sclera White and quiet White and quiet   Cornea Well healed lasik flap, tear film debris Well healed lasik flap w/ 3+ EBMD at interface, tear film debris   Anterior Chamber Deep and quiet Deep and  quiet   Iris Round and dilated Round and dilated   Lens 2+ Cortical cataract, 2+ Nuclear sclerosis 2+ Cortical cataract, 2+ Nuclear sclerosis   Anterior Vitreous mild syneresis mild syneresis         Fundus Exam       Right Left   Disc Pink and Sharp, compact, mild tilt, +SVP, no edema Pink and Sharp, mild tilt, temporal  PPA, no edema   C/D Ratio 0.3 0.4   Macula Flat, Good foveal reflex, RPE mottling and clumping, no heme or edema Flat, Good foveal reflex, RPE mottling and clumping, no heme or edema   Vessels Normal Normal   Periphery Attached, no heme Attached, no heme           Refraction     Wearing Rx       Sphere Cylinder Axis Add   Right +1.00 +0.50 132 +2.00   Left +1.00 +0.75 091 +2.00           IMAGING AND PROCEDURES  Imaging and Procedures for 12/18/2023  OCT, Retina - OU - Both Eyes       Right Eye Quality was good. Central Foveal Thickness: 260. Progression has no prior data. Findings include normal foveal contour, no IRF, no SRF, vitreomacular adhesion .   Left Eye Quality was good. Central Foveal Thickness: 258. Progression has no prior data. Findings include normal foveal contour, no IRF, no SRF, vitreomacular adhesion .   Notes *Images captured and stored on drive  Diagnosis / Impression:  No IRF/SRF, no fluid OU.  No optic disc elevation OU.  Clinical management:  See below  Abbreviations: NFP - Normal foveal profile. CME - cystoid macular edema. PED - pigment epithelial detachment. IRF - intraretinal fluid. SRF - subretinal fluid. EZ - ellipsoid zone. ERM - epiretinal membrane. ORA - outer retinal atrophy. ORT - outer retinal tubulation. SRHM - subretinal hyper-reflective material. IRHM - intraretinal hyper-reflective material           ASSESSMENT/PLAN:   ICD-10-CM   1. Combined forms of age-related cataract of both eyes  H25.813 OCT, Retina - OU - Both Eyes      Hx of headaches and tortuosity of optic nerves on CT. Hx of cerebral aneurysm. Normal eye exam, hx of lasik OU  Tortuosity of optic nerve   - Pt had CT can of max face performed on 08.07.25 showing tortuosity of optic nerve.   - Reports visual disturbances while driving, wavy mirrors and light sensitivity. - hx of cerebral aneurysm - BCVA OD 20/20; OS 20/25   - OCT and exam show no disc edema  or elevation OU -- normal appearance of optic disc OU -- no pathology  - discussed findings - no retinal or ophthalmic interventions indicated or recommended at this time  2. Mixed Cataract OU - The symptoms of cataract, surgical options, and treatments and risks were discussed with patient. - discussed diagnosis and progression - monitor   Ophthalmic Meds Ordered this visit:  No orders of the defined types were placed in this encounter.    Return if symptoms worsen or fail to improve.  There are no Patient Instructions on file for this visit.  Explained the diagnoses, plan, and follow up with the patient and they expressed understanding.  Patient expressed understanding of the importance of proper follow up care.   Redell JUDITHANN Hans, M.D., Ph.D. Diseases & Surgery of the Retina and Vitreous [facility] 12/18/2023  I have reviewed  the above documentation for accuracy and completeness, and I agree with the above. Redell JUDITHANN Hans, M.D., Ph.D. 12/25/23 9:50 PM   Abbreviations: M myopia (nearsighted); A astigmatism; H hyperopia (farsighted); P presbyopia; Mrx spectacle prescription;  CTL contact lenses; OD right eye; OS left eye; OU both eyes  XT exotropia; ET esotropia; PEK punctate epithelial keratitis; PEE punctate epithelial erosions; DES dry eye syndrome; MGD meibomian gland dysfunction; ATs artificial tears; PFAT's preservative free artificial tears; NSC nuclear sclerotic cataract; PSC posterior subcapsular cataract; ERM epi-retinal membrane; PVD posterior vitreous detachment; RD retinal detachment; DM diabetes mellitus; DR diabetic retinopathy; NPDR non-proliferative diabetic retinopathy; PDR proliferative diabetic retinopathy; CSME clinically significant macular edema; DME diabetic macular edema; dbh dot blot hemorrhages; CWS cotton wool spot; POAG primary open angle glaucoma; C/D cup-to-disc ratio; HVF humphrey visual field; GVF goldmann visual field; OCT optical  coherence tomography; IOP intraocular pressure; BRVO Branch retinal vein occlusion; CRVO central retinal vein occlusion; CRAO central retinal artery occlusion; BRAO branch retinal artery occlusion; RT retinal tear; SB scleral buckle; PPV pars plana vitrectomy; VH Vitreous hemorrhage; PRP panretinal laser photocoagulation; IVK intravitreal kenalog; VMT vitreomacular traction; MH Macular hole;  NVD neovascularization of the disc; NVE neovascularization elsewhere; AREDS age related eye disease study; ARMD age related macular degeneration; POAG primary open angle glaucoma; EBMD epithelial/anterior basement membrane dystrophy; ACIOL anterior chamber intraocular lens; IOL intraocular lens; PCIOL posterior chamber intraocular lens; Phaco/IOL phacoemulsification with intraocular lens placement; PRK photorefractive keratectomy; LASIK laser assisted in situ keratomileusis; HTN hypertension; DM diabetes mellitus; COPD chronic obstructive pulmonary disease

## 2023-12-18 ENCOUNTER — Encounter (INDEPENDENT_AMBULATORY_CARE_PROVIDER_SITE_OTHER): Payer: Self-pay | Admitting: Ophthalmology

## 2023-12-18 ENCOUNTER — Ambulatory Visit (INDEPENDENT_AMBULATORY_CARE_PROVIDER_SITE_OTHER): Admitting: Ophthalmology

## 2023-12-18 DIAGNOSIS — H25813 Combined forms of age-related cataract, bilateral: Secondary | ICD-10-CM

## 2023-12-18 DIAGNOSIS — H3581 Retinal edema: Secondary | ICD-10-CM

## 2023-12-25 ENCOUNTER — Encounter (INDEPENDENT_AMBULATORY_CARE_PROVIDER_SITE_OTHER): Payer: Self-pay | Admitting: Ophthalmology

## 2023-12-26 ENCOUNTER — Encounter (INDEPENDENT_AMBULATORY_CARE_PROVIDER_SITE_OTHER): Payer: Self-pay

## 2023-12-31 ENCOUNTER — Encounter (INDEPENDENT_AMBULATORY_CARE_PROVIDER_SITE_OTHER): Payer: Self-pay | Admitting: Family Medicine

## 2023-12-31 ENCOUNTER — Ambulatory Visit (INDEPENDENT_AMBULATORY_CARE_PROVIDER_SITE_OTHER): Admitting: Family Medicine

## 2023-12-31 VITALS — BP 135/76 | HR 60 | Temp 97.8°F | Ht 62.0 in | Wt 219.0 lb

## 2023-12-31 DIAGNOSIS — Z1331 Encounter for screening for depression: Secondary | ICD-10-CM | POA: Diagnosis not present

## 2023-12-31 DIAGNOSIS — Z6841 Body Mass Index (BMI) 40.0 and over, adult: Secondary | ICD-10-CM | POA: Diagnosis not present

## 2023-12-31 DIAGNOSIS — G500 Trigeminal neuralgia: Secondary | ICD-10-CM

## 2023-12-31 DIAGNOSIS — R5383 Other fatigue: Secondary | ICD-10-CM

## 2023-12-31 DIAGNOSIS — R7303 Prediabetes: Secondary | ICD-10-CM

## 2023-12-31 DIAGNOSIS — E782 Mixed hyperlipidemia: Secondary | ICD-10-CM | POA: Diagnosis not present

## 2023-12-31 DIAGNOSIS — I2510 Atherosclerotic heart disease of native coronary artery without angina pectoris: Secondary | ICD-10-CM

## 2023-12-31 DIAGNOSIS — R0602 Shortness of breath: Secondary | ICD-10-CM

## 2023-12-31 NOTE — Assessment & Plan Note (Signed)
 On repatha , managed by Dr. Mona.  No side effects since starting medication.  Will order repeat FLP today.

## 2023-12-31 NOTE — Progress Notes (Signed)
 Chief Complaint:  Obesity   Subjective:  Colleen Dillon (MR# [id number]) is a 58 y.o. female who presents for evaluation and treatment of obesity and related comorbidities.   Colleen Dillon is currently in the action stage of change and ready to dedicate time achieving and maintaining a healthier weight. Colleen Dillon is interested in becoming our patient and working on intensive lifestyle modifications including (but not limited to) diet and exercise for weight loss.  Colleen Dillon has been struggling with her weight. She has been unsuccessful in either losing weight, maintaining weight loss, or reaching her healthy weight goal.  Patient initially went to [facility] and transferred care here.  Works as a Charity fundraiser at [employer].  She works 5 days 8-10 hours a day.  She is married and lives with her husband Colleen Dillon.  He is supportive of her, they eat meals together and he will be changing how he eats with her.  Desired weight is 150lb and last time she was that weight was 20 years ago.  Mentions her job likely led her to gain weight. Previously tried [employer] and atkins.  Husband has alpha gal allergy so she is limited in what she eats at home. She occasionally likes to cook but mentions she is limited due to time. Skips lunch most days due to schedule.   Food Recall: Water  in the am on the way to work.  Protein bar peanut butter.  She is hungry and feels satisfied for 3-4 hours.  If she gets to eat lunch will get a BLT and have water .  Feels satisfied.  Supper may be coffee and fries from [employer] or will do chicken tenders (6) and pringles- feels satisfied.    Indirect Calorimeter completed today shows a RMR: 1555.  Her calculated basal metabolic rate is 8410 thus her basal metabolic rate is worse than expected.  Other Fatigue Colleen Dillon admits to daytime somnolence and admits to waking up still tired. Patient has a history of symptoms of morning headache. Colleen Dillon generally gets 4  to 6 hours of sleep per night, and states  that she has generally restless sleep. Snoring is present. Apneic episodes is not present. Epworth Sleepiness Score is 5.   Shortness of Breath Colleen Dillon notes increasing shortness of breath with exercising and seems to be worsening over time with weight gain. She notes getting out of breath sooner with activity than she used to. This has not gotten worse recently. Colleen Dillon denies shortness of breath at rest or orthopnea.  Depression Screen Colleen Dillon's Food and Mood (modified PHQ-9) score was 7.     12/31/2023    9:21 AM  Depression screen PHQ 2/9  Decreased Interest 1  Down, Depressed, Hopeless 0  PHQ - 2 Score 1  Altered sleeping 1  Tired, decreased energy 2  Change in appetite 1  Feeling bad or failure about yourself  1  Trouble concentrating 0  Moving slowly or fidgety/restless 0  Suicidal thoughts 0  PHQ-9 Score 6     Objective:  Vitals Temp: 97.8 F (36.6 C) BP: 135/76 Pulse Rate: 60 SpO2: 99 %   Anthropometric Measurements Height: 5' 2 (1.575 m) Weight: 219 lb (99.3 kg) BMI (Calculated): 40.05 Starting Weight: 219 lb Peak Weight: 220 lb Waist Measurement : 43.5 inches   Body Composition  Body Fat %: 49.1 % Fat Mass (lbs): 107.8 lbs Muscle Mass (lbs): 106.2 lbs Total Body Water  (lbs): 79.8 lbs Visceral Fat Rating : 16   Other Clinical Data RMR: 1555 Fasting: yes  Labs: yes Today's Visit #: 1 Starting Date: 12/31/23 Comments: 1    EKG: Normal sinus rhythm, rate 61.  General: Cooperative, alert, well developed, in no acute distress. HEENT: Conjunctivae and lids unremarkable. Cardiovascular: Regular rhythm.  Lungs: Normal work of breathing. Neurologic: No focal deficits.   Lab Results  Component Value Date   CREATININE 1.28 (H) 09/06/2022   BUN 14 09/06/2022   NA 145 09/06/2022   K 4.4 09/06/2022   CL 107 09/06/2022   CO2 29 09/06/2022   Lab Results  Component Value Date   ALT 12 02/20/2020   AST 18 02/20/2020   ALKPHOS 67 02/20/2020    BILITOT 0.5 02/20/2020   Lab Results  Component Value Date   HGBA1C 5.7 (H) 08/18/2013   HGBA1C 5.7 (H) 10/16/2010   No results found for: INSULIN Lab Results  Component Value Date   TSH 2.860 06/24/2013   Lab Results  Component Value Date   CHOL 148 09/21/2023   HDL 53 09/21/2023   LDLCALC 74 09/21/2023   TRIG 116 09/21/2023   CHOLHDL 2.8 09/21/2023   Lab Results  Component Value Date   WBC 7.0 02/14/2022   HGB 11.2 (L) 02/14/2022   HCT 33.1 (L) 02/14/2022   MCV 92.5 02/14/2022   PLT 272 02/14/2022   No results found for: IRON, TIBC, FERRITIN  Assessment and Plan:  Assessment & Plan Other fatigue  SOBOE (shortness of breath on exertion)  Depression screening  Prediabetes Slightly elevated A1c years ago but no repeat A1c since that time.  Needs repeat A1c and Insulin level today. Coronary artery disease involving native coronary artery of native heart without angina pectoris On metoprolol  and sees cardiology.  She is also on a baby aspirin  daily.  No change in therapy. Previously on plavix  for 5 stents in her heart.  Stopped due to intracranial shunt. Mixed hyperlipidemia On repatha , managed by Dr. Mona.  No side effects since starting medication.  Will order repeat FLP today. Morbid obesity (HCC), STARTING BMI 39.5  BMI 40.0-44.9, adult (HCC)  Trigeminal neuralgia On oxcarbazapine for pain control.  Takes 1x daily due to inconsistency of symptoms.  No change in therapy- continue to follow up with Dr. Skeet.   Other Fatigue  Colleen Dillon does feel that her weight is causing her energy to be lower than it should be. Fatigue may be related to obesity, depression or many other causes. Labs will be ordered, and in the meanwhile, Colleen Dillon will focus on self care including making healthy food choices, increasing physical activity and focusing on stress reduction.  Shortness of Breath  Colleen Dillon does feel that she gets out of breath more easily that she used to when she  exercises. Colleen Dillon's shortness of breath appears to be obesity related and exercise induced. She has agreed to work on weight loss and gradually increase exercise to treat her exercise induced shortness of breath. Will continue to monitor closely.   Problem List Items Addressed This Visit       Cardiovascular and Mediastinum   Coronary artery disease involving native coronary artery of native heart without angina pectoris   On metoprolol  and sees cardiology.  She is also on a baby aspirin  daily.  No change in therapy. Previously on plavix  for 5 stents in her heart.  Stopped due to intracranial shunt.        Other   Mixed hyperlipidemia   On repatha , managed by Dr. Mona.  No side effects since starting medication.  Will order  repeat FLP today.      Relevant Orders   Lipid Panel With LDL/HDL Ratio   Fatigue - Primary   Relevant Orders   Vitamin B12   T4, free   T3   Folate   VITAMIN D 25 Hydroxy (Vit-D Deficiency, Fractures)   TSH   Other Visit Diagnoses       SOBOE (shortness of breath on exertion)       Relevant Orders   CBC with Differential/Platelet     Depression screening         Prediabetes       Relevant Orders   Comprehensive metabolic panel with GFR   Hemoglobin A1c   Insulin, random     Morbid obesity (HCC), STARTING BMI 39.5         BMI 40.0-44.9, adult (HCC)         Trigeminal neuralgia           Colleen Dillon is currently in the action stage of change and her goal is to continue with weight loss efforts. I recommend Colleen Dillon begin the structured treatment plan as follows:  She has agreed to Category 2 Plan and keeping a food journal and adhering to recommended goals of 1150-1250 calories and 85 or more grams of protein  Exercise goals: All adults should avoid inactivity. Some activity is better than none, and adults who participate in any amount of physical activity, gain some health benefits.  Behavioral modification strategies:increasing lean protein intake,  decreasing simple carbohydrates, increasing vegetables, meal planning and cooking strategies, and planning for success  She was informed of the importance of frequent follow-up visits to maximize her success with intensive lifestyle modifications for her multiple health conditions. She was informed we would discuss her lab results at her next visit unless there is a critical issue that needs to be addressed sooner. Colleen Dillon agreed to keep her next visit at the agreed upon time to discuss these results.  Labs ordered with plans to discuss at the next visit.   Attestation Statements:  Reviewed by clinician on day of visit: allergies, medications, problem list, medical history, surgical history, family history, social history, and previous encounter notes.  This is the patient's first visit at [facility]. The patient's NEW PATIENT PACKET was reviewed at length. Included in the packet: current and past health history, medications, allergies, ROS, gynecologic history (women only), surgical history, family history, social history, weight history, weight loss surgery history (for those that have had weight loss surgery), nutritional evaluation, mood and food questionnaire, PHQ9, Epworth questionnaire, sleep habits questionnaire, patient life and health improvement goals questionnaire. These will all be scanned into the patient's chart under media.   During the visit, I independently reviewed the patient's EKG, bioimpedance scale results, and indirect calorimeter results. I used this information to tailor a meal plan for the patient that will help her to lose weight and will improve her obesity-related conditions going forward. I performed a medically necessary appropriate examination and/or evaluation. I discussed the assessment and treatment plan with the patient. The patient was provided an opportunity to ask questions and all were answered. The patient agreed with the plan and demonstrated an  understanding of the instructions. Labs were ordered at this visit and will be reviewed at the next visit unless more critical results need to be addressed immediately. Clinical information was updated and documented in the EMR.    Colleen Cho, MD

## 2023-12-31 NOTE — Assessment & Plan Note (Signed)
 On metoprolol  and sees cardiology.  She is also on a baby aspirin  daily.  No change in therapy. Previously on plavix  for 5 stents in her heart.  Stopped due to intracranial shunt.

## 2024-01-01 LAB — CBC WITH DIFFERENTIAL/PLATELET
Basophils Absolute: 0.1 x10E3/uL (ref 0.0–0.2)
Basos: 2 %
EOS (ABSOLUTE): 0.1 x10E3/uL (ref 0.0–0.4)
Eos: 2 %
Hematocrit: 41.7 % (ref 34.0–46.6)
Hemoglobin: 13.5 g/dL (ref 11.1–15.9)
Immature Grans (Abs): 0 x10E3/uL (ref 0.0–0.1)
Immature Granulocytes: 0 %
Lymphocytes Absolute: 1.2 x10E3/uL (ref 0.7–3.1)
Lymphs: 32 %
MCH: 30.8 pg (ref 26.6–33.0)
MCHC: 32.4 g/dL (ref 31.5–35.7)
MCV: 95 fL (ref 79–97)
Monocytes Absolute: 0.2 x10E3/uL (ref 0.1–0.9)
Monocytes: 5 %
Neutrophils Absolute: 2.2 x10E3/uL (ref 1.4–7.0)
Neutrophils: 59 %
Platelets: 337 x10E3/uL (ref 150–450)
RBC: 4.39 x10E6/uL (ref 3.77–5.28)
RDW: 12.2 % (ref 11.7–15.4)
WBC: 3.8 x10E3/uL (ref 3.4–10.8)

## 2024-01-01 LAB — COMPREHENSIVE METABOLIC PANEL WITH GFR
ALT: 9 IU/L (ref 0–32)
AST: 14 IU/L (ref 0–40)
Albumin: 4.2 g/dL (ref 3.8–4.9)
Alkaline Phosphatase: 116 IU/L (ref 49–135)
BUN/Creatinine Ratio: 16 (ref 12–28)
BUN: 12 mg/dL (ref 8–27)
Bilirubin Total: 0.3 mg/dL (ref 0.0–1.2)
CO2: 23 mmol/L (ref 20–29)
Calcium: 9.5 mg/dL (ref 8.7–10.3)
Chloride: 104 mmol/L (ref 96–106)
Creatinine, Ser: 0.77 mg/dL (ref 0.57–1.00)
Globulin, Total: 2.8 g/dL (ref 1.5–4.5)
Glucose: 89 mg/dL (ref 70–99)
Potassium: 4.5 mmol/L (ref 3.5–5.2)
Sodium: 142 mmol/L (ref 134–144)
Total Protein: 7 g/dL (ref 6.0–8.5)
eGFR: 88 mL/min/1.73 (ref >59)

## 2024-01-01 LAB — VITAMIN D 25 HYDROXY (VIT D DEFICIENCY, FRACTURES): Vit D, 25-Hydroxy: 21.2 ng/mL — ABNORMAL LOW (ref 30.0–100.0)

## 2024-01-01 LAB — TSH: TSH: 2.93 u[IU]/mL (ref 0.450–4.500)

## 2024-01-01 LAB — LIPID PANEL WITH LDL/HDL RATIO
Cholesterol, Total: 170 mg/dL (ref 100–199)
HDL: 49 mg/dL (ref >39)
LDL Chol Calc (NIH): 87 mg/dL (ref 0–99)
LDL/HDL Ratio: 1.8 ratio (ref 0.0–3.2)
Triglycerides: 200 mg/dL — ABNORMAL HIGH (ref 0–149)
VLDL Cholesterol Cal: 34 mg/dL (ref 5–40)

## 2024-01-01 LAB — INSULIN, RANDOM: INSULIN: 19.9 u[IU]/mL (ref 2.6–24.9)

## 2024-01-01 LAB — T3: T3, Total: 154 ng/dL (ref 71–180)

## 2024-01-01 LAB — HEMOGLOBIN A1C
Est. average glucose Bld gHb Est-mCnc: 114 mg/dL
Hgb A1c MFr Bld: 5.6 % (ref 4.8–5.6)

## 2024-01-01 LAB — T4, FREE: Free T4: 0.98 ng/dL (ref 0.82–1.77)

## 2024-01-01 LAB — FOLATE: Folate: 4.4 ng/mL (ref >3.0)

## 2024-01-01 LAB — VITAMIN B12: Vitamin B-12: 448 pg/mL (ref 232–1245)

## 2024-01-15 ENCOUNTER — Encounter (INDEPENDENT_AMBULATORY_CARE_PROVIDER_SITE_OTHER): Payer: Self-pay | Admitting: Family Medicine

## 2024-01-15 ENCOUNTER — Ambulatory Visit (INDEPENDENT_AMBULATORY_CARE_PROVIDER_SITE_OTHER): Admitting: Family Medicine

## 2024-01-15 ENCOUNTER — Other Ambulatory Visit (HOSPITAL_COMMUNITY): Payer: Self-pay

## 2024-01-15 VITALS — BP 126/80 | HR 62 | Temp 97.7°F | Ht 62.0 in | Wt 214.0 lb

## 2024-01-15 DIAGNOSIS — Z6839 Body mass index (BMI) 39.0-39.9, adult: Secondary | ICD-10-CM

## 2024-01-15 DIAGNOSIS — E88819 Insulin resistance, unspecified: Secondary | ICD-10-CM | POA: Diagnosis not present

## 2024-01-15 DIAGNOSIS — E559 Vitamin D deficiency, unspecified: Secondary | ICD-10-CM | POA: Diagnosis not present

## 2024-01-15 DIAGNOSIS — E782 Mixed hyperlipidemia: Secondary | ICD-10-CM | POA: Diagnosis not present

## 2024-01-15 MED ORDER — VITAMIN D (ERGOCALCIFEROL) 1.25 MG (50000 UNIT) PO CAPS
50000.0000 [IU] | ORAL_CAPSULE | ORAL | 0 refills | Status: AC
  Filled 2024-01-15: qty 12, 84d supply, fill #0

## 2024-01-15 NOTE — Assessment & Plan Note (Addendum)
 Patient is already on repatha  with known CAD  The ASCVD Risk score (Arnett DK, et al., 2019) failed to calculate for the following reasons:   Risk score cannot be calculated because patient has a medical history suggesting prior/existing ASCVD  No side effects of repatha  mentioned.  Will need a repeat lab in 4 months.

## 2024-01-15 NOTE — Progress Notes (Signed)
 SUBJECTIVE:  Chief Complaint: Obesity  Interim History: Colleen Dillon voices the first few weeks have been alright.  She mentions it has been somewhat hard to break some of her habits.  She has been around 1150-1250 calories and is getting close to the 85 grams of protein daily.  Patient is feeling like the logging is helping stay aware of what she is taking in.  Voices her husband is on track with helping her. No upcoming plans for activities, events or travel.  She finds the logging to be the easiest to be flexible with.   Colleen Dillon is here to discuss her progress with her obesity treatment plan. She is on the keeping a food journal and adhering to recommended goals of 1150-1250 calories and 85 grams of protein and states she is following her eating plan approximately 90 % of the time. She states she is not exercising .  OBJECTIVE: Visit Diagnoses: Problem List Items Addressed This Visit       Endocrine   Insulin resistance   Pathophysiology of progression through insulin resistance to prediabetes and diabetes was discussed at length today.  Patient to continue to monitor and be in control of total intake of snack calories which may be simple carbohydrates but should be consumed only after the patient has taken in all the nutrition for the day.  Macronutrient identification, classification and daily intake ratios were discussed.  Plan to repeat labs in 3 months to monitor both hemoglobin A1c and insulin levels.  No medications at this time as patient is not having significant hunger or cravings that would make following meal plan more difficult.           Other   Mixed hyperlipidemia - Primary   Patient is already on repatha  with known CAD  The ASCVD Risk score (Arnett DK, et al., 2019) failed to calculate for the following reasons:   Risk score cannot be calculated because patient has a medical history suggesting prior/existing ASCVD         Vitamin D deficiency   Discussed importance of  vitamin d supplementation.  Vitamin d supplementation has been shown to decrease fatigue, decrease risk of progression to insulin resistance and then prediabetes, decreases risk of falling in older age and can even assist in decreasing depressive symptoms in PTSD.   Prescription for Vitamin D sent in.        Relevant Medications   Vitamin D, Ergocalciferol, (DRISDOL) 1.25 MG (50000 UNIT) CAPS capsule   Other Visit Diagnoses       Morbid obesity (HCC), STARTING BMI 39.5         BMI 39.0-39.9,adult           Vitals Temp: 97.7 F (36.5 C) BP: 126/80 Pulse Rate: 62 SpO2: 98 %   Anthropometric Measurements Height: 5' 2 (1.575 m) Weight: 214 lb (97.1 kg) BMI (Calculated): 39.13 Weight at Last Visit: 219 ln Weight Lost Since Last Visit: 5 Weight Gained Since Last Visit: 0 Starting Weight: 219 lb Total Weight Loss (lbs): 5 lb (2.268 kg) Peak Weight: 0 Waist Measurement : 0 inches   Body Composition  Body Fat %: 48.5 % Fat Mass (lbs): 104.2 lbs Muscle Mass (lbs): 105 lbs Total Body Water  (lbs): 82 lbs Visceral Fat Rating : 15   Other Clinical Data Today's Visit #: 2 Starting Date: 12/31/23     ASSESSMENT AND PLAN: Assessment & Plan Mixed hyperlipidemia Patient is already on repatha  with known CAD  The ASCVD Risk score (Arnett  DK, et al., 2019) failed to calculate for the following reasons:   Risk score cannot be calculated because patient has a medical history suggesting prior/existing ASCVD  No side effects of repatha  mentioned.  Will need a repeat lab in 4 months.    Vitamin D deficiency Discussed importance of vitamin d supplementation.  Vitamin d supplementation has been shown to decrease fatigue, decrease risk of progression to insulin resistance and then prediabetes, decreases risk of falling in older age and can even assist in decreasing depressive symptoms in PTSD.   Prescription for Vitamin D sent in.   Insulin resistance Pathophysiology of  progression through insulin resistance to prediabetes and diabetes was discussed at length today.  Patient to continue to monitor and be in control of total intake of snack calories which may be simple carbohydrates but should be consumed only after the patient has taken in all the nutrition for the day.  Macronutrient identification, classification and daily intake ratios were discussed.  Plan to repeat labs in 3 months to monitor both hemoglobin A1c and insulin levels.  No medications at this time as patient is not having significant hunger or cravings that would make following meal plan more difficult.    Morbid obesity (HCC), STARTING BMI 39.5  BMI 39.0-39.9,adult    Diet: Samara is currently in the action stage of change. As such, her goal is to continue with weight loss efforts and has agreed to keeping a food journal and adhering to recommended goals of 1150-1250 calories and 85 or more grams of protein.   Exercise:  For substantial health benefits, adults should do at least 150 minutes (2 hours and 30 minutes) a week of moderate-intensity, or 75 minutes (1 hour and 15 minutes) a week of vigorous-intensity aerobic physical activity, or an equivalent combination of moderate- and vigorous-intensity aerobic activity. Aerobic activity should be performed in episodes of at least 10 minutes, and preferably, it should be spread throughout the week.  Behavior Modification:  We discussed the following Behavioral Modification Strategies today: increasing lean protein intake, decreasing simple carbohydrates, increasing vegetables, meal planning and cooking strategies, and planning for success.   Return in about 3 weeks (around 02/05/2024).   She was informed of the importance of frequent follow up visits to maximize her success with intensive lifestyle modifications for her multiple health conditions.  Attestation Statements:   Reviewed by clinician on day of visit: allergies, medications, problem  list, medical history, surgical history, family history, social history, and previous encounter notes.  Adelita Cho, MD

## 2024-01-15 NOTE — Assessment & Plan Note (Addendum)
 Discussed importance of vitamin d supplementation.  Vitamin d supplementation has been shown to decrease fatigue, decrease risk of progression to insulin resistance and then prediabetes, decreases risk of falling in older age and can even assist in decreasing depressive symptoms in PTSD.   Prescription for Vitamin D sent in.

## 2024-01-15 NOTE — Assessment & Plan Note (Addendum)

## 2024-01-16 ENCOUNTER — Other Ambulatory Visit: Payer: Self-pay

## 2024-01-19 ENCOUNTER — Other Ambulatory Visit: Payer: Self-pay | Admitting: Internal Medicine

## 2024-01-20 ENCOUNTER — Other Ambulatory Visit (HOSPITAL_COMMUNITY): Payer: Self-pay

## 2024-01-21 ENCOUNTER — Other Ambulatory Visit: Payer: Self-pay

## 2024-01-22 ENCOUNTER — Other Ambulatory Visit: Payer: Self-pay

## 2024-01-22 ENCOUNTER — Other Ambulatory Visit (HOSPITAL_COMMUNITY): Payer: Self-pay

## 2024-01-22 MED ORDER — METOPROLOL TARTRATE 25 MG PO TABS
12.5000 mg | ORAL_TABLET | Freq: Two times a day (BID) | ORAL | 2 refills | Status: AC
  Filled 2024-01-22: qty 90, 90d supply, fill #0
  Filled 2024-04-05: qty 90, 90d supply, fill #1
  Filled 2024-05-04: qty 90, 90d supply, fill #2

## 2024-01-30 ENCOUNTER — Other Ambulatory Visit (HOSPITAL_COMMUNITY): Payer: Self-pay

## 2024-01-30 MED ORDER — FLUZONE 0.5 ML IM SUSY
0.5000 mL | PREFILLED_SYRINGE | Freq: Once | INTRAMUSCULAR | 0 refills | Status: AC
  Filled 2024-01-30: qty 0.5, 1d supply, fill #0

## 2024-01-31 ENCOUNTER — Other Ambulatory Visit (HOSPITAL_COMMUNITY): Payer: Self-pay | Admitting: Radiology

## 2024-01-31 DIAGNOSIS — I671 Cerebral aneurysm, nonruptured: Secondary | ICD-10-CM

## 2024-02-07 ENCOUNTER — Encounter: Payer: Self-pay | Admitting: Neuroradiology

## 2024-02-07 ENCOUNTER — Encounter (INDEPENDENT_AMBULATORY_CARE_PROVIDER_SITE_OTHER): Payer: Self-pay | Admitting: Family Medicine

## 2024-02-07 ENCOUNTER — Ambulatory Visit (INDEPENDENT_AMBULATORY_CARE_PROVIDER_SITE_OTHER): Admitting: Neuroradiology

## 2024-02-07 ENCOUNTER — Ambulatory Visit (INDEPENDENT_AMBULATORY_CARE_PROVIDER_SITE_OTHER): Payer: Self-pay | Admitting: Family Medicine

## 2024-02-07 VITALS — BP 103/72 | HR 60 | Temp 97.9°F | Ht 62.0 in | Wt 215.4 lb

## 2024-02-07 VITALS — BP 112/74 | HR 63 | Temp 97.7°F | Ht 62.0 in | Wt 210.0 lb

## 2024-02-07 DIAGNOSIS — Z978 Presence of other specified devices: Secondary | ICD-10-CM

## 2024-02-07 DIAGNOSIS — I671 Cerebral aneurysm, nonruptured: Secondary | ICD-10-CM

## 2024-02-07 DIAGNOSIS — E559 Vitamin D deficiency, unspecified: Secondary | ICD-10-CM | POA: Diagnosis not present

## 2024-02-07 DIAGNOSIS — Z780 Asymptomatic menopausal state: Secondary | ICD-10-CM | POA: Insufficient documentation

## 2024-02-07 DIAGNOSIS — Z6838 Body mass index (BMI) 38.0-38.9, adult: Secondary | ICD-10-CM

## 2024-02-07 DIAGNOSIS — R928 Other abnormal and inconclusive findings on diagnostic imaging of breast: Secondary | ICD-10-CM | POA: Insufficient documentation

## 2024-02-07 NOTE — Progress Notes (Signed)
 I had the pleasure of meeting Ms. Colleen Dillon in the office today.  Briefly, she had an MRI and MR angiogram January 30, 2021 for right-sided trigeminal neuralgia.  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  After consultation with Dr. Monna, she elected to undergo treatment and had a pipeline placed on February 13, 2022.  She has had a catheter arteriogram in 2 MR angiogram since that time, the last on 10/08/2023.  These demonstrate persistent flow in the aneurysm, but no interval growth.  It appears to me that the ophthalmic artery arises from the base of the aneurysm, and for this reason (because of the demand for flow from the ophthalmic artery), the aneurysm has not sealed.  Her past medical history is significant for a myocardial infarction due to her coronary artery dissection, dyslipidemia and hypertension.  These are controlled medically and she takes daily aspirin .  I have reviewed her MRI from 01/30/2021, the arteriogram and treatment of the aneurysm from 02/13/2022 and her most recent MR angiogram from 10/08/2023 personally.  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  This was previously treated with flow diversion, which has not been successful and sealing the aneurysm most likely due to the ophthalmic artery coming from the aneurysm itself.  Recommendation:  Our options are continued observation, versus placement of another flow diverting stent.  As the aneurysm has been stable for 3 years, and is small, I think the risk of hemorrhage from the aneurysm is low.  It is also uncertain if placement of an additional device will seal the aneurysm, as the ophthalmic artery appears to arise from the base of the aneurysm.  For the above reasons, I do not recommend  retreatment of the aneurysm.  We will check a follow-up MR angiogram in 2 years.  If this shows no change, further follow-up will not be necessary.

## 2024-02-07 NOTE — Progress Notes (Signed)
   SUBJECTIVE:  Chief Complaint: Obesity  Interim History: Patient mentions that she has been staying consistent with her food intake over the last few weeks due to work demands.  She is busy most of her time off of work.  She is comfortable eating the same meals daily.  She is playing with her grandson outside 4 days a week.  She is anticipating her kids and grandkids coming in and out for Thanksgiving with whatever they decide to cook. She is at 1750-1800 calories daily and is over 80 grams of protein daily.    Colleen Dillon is here to discuss her progress with her obesity treatment plan. She is on the keeping a food journal and adhering to recommended goals of 1150-1250 calories and 85 grams of protein and states she is following her eating plan approximately 98 % of the time. She states she is exercising 20-30 minutes 3-4 times per week.   OBJECTIVE: Visit Diagnoses: Problem List Items Addressed This Visit   None   Vitals Temp: 97.7 F (36.5 C) BP: 112/74 Pulse Rate: 63 SpO2: 97 %   Anthropometric Measurements Height: 5' 2 (1.575 m) Weight: 210 lb (95.3 kg) BMI (Calculated): 38.4 Weight at Last Visit: 214 lbs Weight Lost Since Last Visit: 4 Weight Gained Since Last Visit: 0 Starting Weight: 219 lbs Total Weight Loss (lbs): 9 lb (4.082 kg)   Body Composition  Body Fat %: 48.1 % Fat Mass (lbs): 101 lbs Muscle Mass (lbs): 103.4 lbs Total Body Water  (lbs): 81 lbs Visceral Fat Rating : 15   Other Clinical Data Today's Visit #: 3 Starting Date: 12/31/23 Comments: 1150-1250/85     ASSESSMENT AND PLAN: Assessment & Plan Vitamin D deficiency Tolerating prescription strength vitamin D.  No refill needed at this time.  No nausea, vomiting, or muscle weakness reported. Morbid obesity (HCC), STARTING BMI 39.5  BMI 38.0-38.9,adult    Diet: Colleen Dillon is currently in the action stage of change. As such, her goal is to continue with weight loss efforts and has agreed to keeping a  food journal and adhering to recommended goals of 1500-1600 calories and 95 or more grams of protein.   Exercise:  For additional and more extensive health benefits, adults should increase their aerobic physical activity to 300 minutes (5 hours) a week of moderate-intensity, or 150 minutes a week of vigorous-intensity aerobic physical activity, or an equivalent combination of moderate- and vigorous-intensity activity. Additional health benefits are gained by engaging in physical activity beyond this amount.   Behavior Modification:  We discussed the following Behavioral Modification Strategies today: increasing lean protein intake, decreasing simple carbohydrates, increasing vegetables, meal planning and cooking strategies, keeping healthy foods in the home, planning for success, and keep a strict food journal.  Return in about 5 weeks (around 03/13/2024).   She was informed of the importance of frequent follow up visits to maximize her success with intensive lifestyle modifications for her multiple health conditions.  Attestation Statements:   Reviewed by clinician on day of visit: allergies, medications, problem list, medical history, surgical history, family history, social history, and previous encounter notes.     Colleen Cho, MD

## 2024-02-12 ENCOUNTER — Other Ambulatory Visit (HOSPITAL_COMMUNITY): Payer: Self-pay

## 2024-02-15 NOTE — Assessment & Plan Note (Signed)
 Tolerating prescription strength vitamin D.  No refill needed at this time.  No nausea, vomiting, or muscle weakness reported.

## 2024-02-18 ENCOUNTER — Other Ambulatory Visit: Payer: Self-pay

## 2024-02-18 ENCOUNTER — Other Ambulatory Visit (HOSPITAL_COMMUNITY): Payer: Self-pay

## 2024-03-10 ENCOUNTER — Telehealth: Admitting: Nurse Practitioner

## 2024-03-10 DIAGNOSIS — J208 Acute bronchitis due to other specified organisms: Secondary | ICD-10-CM | POA: Diagnosis not present

## 2024-03-10 MED ORDER — PROMETHAZINE-DM 6.25-15 MG/5ML PO SYRP: 5.0000 mL | ORAL_SOLUTION | Freq: Four times a day (QID) | ORAL | 0 refills | Status: AC | PRN

## 2024-03-10 MED ORDER — PREDNISONE 10 MG (21) PO TBPK: ORAL_TABLET | ORAL | 0 refills | Status: AC

## 2024-03-10 NOTE — Progress Notes (Signed)
 We are sorry that you are not feeling well.  Here is how we plan to help!  Based on your presentation I believe you most likely have A cough due to a virus.  This is called viral bronchitis and is best treated by rest, plenty of fluids and control of the cough.  You may use Ibuprofen or Tylenol  as directed to help your symptoms.     In addition I have prescribed promethazine -DM cough syrup to use as directed, as well as a steroid pack to reduce inflammation in the lungs.  From your responses in the eVisit questionnaire you describe inflammation in the upper respiratory tract which is causing a significant cough.  This is commonly called Bronchitis and has four common causes:   Allergies Viral Infections Acid Reflux Bacterial Infection Allergies, viruses and acid reflux are treated by controlling symptoms or eliminating the cause. An example might be a cough caused by taking certain blood pressure medications. You stop the cough by changing the medication. Another example might be a cough caused by acid reflux. Controlling the reflux helps control the cough.  USE OF BRONCHODILATOR (RESCUE) INHALERS: There is a risk from using your bronchodilator too frequently.  The risk is that over-reliance on a medication which only relaxes the muscles surrounding the breathing tubes can reduce the effectiveness of medications prescribed to reduce swelling and congestion of the tubes themselves.  Although you feel brief relief from the bronchodilator inhaler, your asthma may actually be worsening with the tubes becoming more swollen and filled with mucus.  This can delay other crucial treatments, such as oral steroid medications. If you need to use a bronchodilator inhaler daily, several times per day, you should discuss this with your provider.  There are probably better treatments that could be used to keep your asthma under control.     HOME CARE Only take medications as instructed by your medical  team. Complete the entire course of an antibiotic. Drink plenty of fluids and get plenty of rest. Avoid close contacts especially the very young and the elderly Cover your mouth if you cough or cough into your sleeve. Always remember to wash your hands A steam or ultrasonic humidifier can help congestion.   GET HELP RIGHT AWAY IF: You develop worsening fever. You become short of breath You cough up blood. Your symptoms persist after you have completed your treatment plan MAKE SURE YOU  Understand these instructions. Will watch your condition. Will get help right away if you are not doing well or get worse.  Your e-visit answers were reviewed by a board certified advanced clinical practitioner to complete your personal care plan.  Depending on the condition, your plan could have included both over the counter or prescription medications. If there is a problem please reply  once you have received a response from your provider. Your safety is important to us .  If you have drug allergies check your prescription carefully.    You can use MyChart to ask questions about today's visit, request a non-urgent call back, or ask for a work or school excuse for 24 hours related to this e-Visit. If it has been greater than 24 hours you will need to follow up with your provider, or enter a new e-Visit to address those concerns. You will get an e-mail in the next two days asking about your experience.  I hope that your e-visit has been valuable and will speed your recovery. Thank you for using e-visits.   I have  spent 5 minutes in review of e-visit questionnaire, review and updating patient chart, medical decision making and response to patient.   Elsie Velma Lunger, PA-C

## 2024-03-19 ENCOUNTER — Telehealth: Admitting: Physician Assistant

## 2024-03-19 DIAGNOSIS — R3989 Other symptoms and signs involving the genitourinary system: Secondary | ICD-10-CM | POA: Diagnosis not present

## 2024-03-19 MED ORDER — CEPHALEXIN 500 MG PO CAPS: 500.0000 mg | ORAL_CAPSULE | Freq: Two times a day (BID) | ORAL | 0 refills | Status: AC

## 2024-03-19 NOTE — Progress Notes (Signed)

## 2024-03-24 ENCOUNTER — Telehealth: Admitting: Physician Assistant

## 2024-03-24 ENCOUNTER — Encounter

## 2024-03-24 DIAGNOSIS — R052 Subacute cough: Secondary | ICD-10-CM

## 2024-03-24 DIAGNOSIS — R0989 Other specified symptoms and signs involving the circulatory and respiratory systems: Secondary | ICD-10-CM | POA: Diagnosis not present

## 2024-03-24 DIAGNOSIS — B349 Viral infection, unspecified: Secondary | ICD-10-CM | POA: Diagnosis not present

## 2024-03-24 NOTE — Progress Notes (Signed)
" °  Because of persistent illness despite steroids and currently on antibiotics, I feel your condition warrants further evaluation and I recommend that you be seen in a face-to-face visit.   NOTE: There will be NO CHARGE for this E-Visit   If you are having a true medical emergency, please call 911.     For an urgent face to face visit, [facility] has multiple urgent care centers for your convenience.  Click the link below for the full list of locations and hours, walk-in wait times, appointment scheduling options and driving directions:  Urgent Care - [place], [place], [place], [place], [place], [place]  [place]     Your MyChart E-visit questionnaire answers were reviewed by a board certified advanced clinical practitioner to complete your personal care plan based on your specific symptoms.    Thank you for using e-Visits.    "

## 2024-04-05 ENCOUNTER — Other Ambulatory Visit (INDEPENDENT_AMBULATORY_CARE_PROVIDER_SITE_OTHER): Payer: Self-pay | Admitting: Family Medicine

## 2024-04-05 ENCOUNTER — Other Ambulatory Visit (HOSPITAL_COMMUNITY): Payer: Self-pay

## 2024-04-05 DIAGNOSIS — E559 Vitamin D deficiency, unspecified: Secondary | ICD-10-CM

## 2024-04-07 ENCOUNTER — Other Ambulatory Visit (HOSPITAL_COMMUNITY): Payer: Self-pay

## 2024-04-07 ENCOUNTER — Ambulatory Visit (INDEPENDENT_AMBULATORY_CARE_PROVIDER_SITE_OTHER): Admitting: Family Medicine

## 2024-04-08 ENCOUNTER — Other Ambulatory Visit: Payer: Self-pay

## 2024-05-04 ENCOUNTER — Other Ambulatory Visit (INDEPENDENT_AMBULATORY_CARE_PROVIDER_SITE_OTHER): Payer: Self-pay | Admitting: Family Medicine

## 2024-05-04 DIAGNOSIS — E559 Vitamin D deficiency, unspecified: Secondary | ICD-10-CM

## 2024-05-05 ENCOUNTER — Other Ambulatory Visit (HOSPITAL_COMMUNITY): Payer: Self-pay

## 2024-05-05 ENCOUNTER — Other Ambulatory Visit: Payer: Self-pay

## 2024-05-05 ENCOUNTER — Other Ambulatory Visit (HOSPITAL_BASED_OUTPATIENT_CLINIC_OR_DEPARTMENT_OTHER): Payer: Self-pay
